# Patient Record
Sex: Female | Born: 1940 | Race: White | Hispanic: No | Smoking: Never smoker
Health system: Southern US, Community
[De-identification: ages and names within clinical notes are randomized; demographics above are authoritative.]

## PROBLEM LIST (undated history)

## (undated) DIAGNOSIS — R001 Bradycardia, unspecified: Secondary | ICD-10-CM

## (undated) DIAGNOSIS — R5383 Other fatigue: Secondary | ICD-10-CM

## (undated) DIAGNOSIS — R0602 Shortness of breath: Secondary | ICD-10-CM

## (undated) DIAGNOSIS — Z136 Encounter for screening for cardiovascular disorders: Secondary | ICD-10-CM

## (undated) DIAGNOSIS — I2510 Atherosclerotic heart disease of native coronary artery without angina pectoris: Secondary | ICD-10-CM

## (undated) DIAGNOSIS — E039 Hypothyroidism, unspecified: Secondary | ICD-10-CM

## (undated) DIAGNOSIS — M1990 Unspecified osteoarthritis, unspecified site: Secondary | ICD-10-CM

## (undated) DIAGNOSIS — J45909 Unspecified asthma, uncomplicated: Secondary | ICD-10-CM

## (undated) DIAGNOSIS — H269 Unspecified cataract: Secondary | ICD-10-CM

## (undated) DIAGNOSIS — F329 Major depressive disorder, single episode, unspecified: Secondary | ICD-10-CM

## (undated) DIAGNOSIS — K219 Gastro-esophageal reflux disease without esophagitis: Secondary | ICD-10-CM

## (undated) DIAGNOSIS — Z9289 Personal history of other medical treatment: Secondary | ICD-10-CM

## (undated) DIAGNOSIS — M8580 Other specified disorders of bone density and structure, unspecified site: Secondary | ICD-10-CM

## (undated) DIAGNOSIS — F32A Depression, unspecified: Secondary | ICD-10-CM

## (undated) DIAGNOSIS — Z8669 Personal history of other diseases of the nervous system and sense organs: Secondary | ICD-10-CM

## (undated) DIAGNOSIS — E785 Hyperlipidemia, unspecified: Secondary | ICD-10-CM

## (undated) DIAGNOSIS — M25569 Pain in unspecified knee: Secondary | ICD-10-CM

## (undated) DIAGNOSIS — E7800 Pure hypercholesterolemia, unspecified: Secondary | ICD-10-CM

## (undated) DIAGNOSIS — Z1211 Encounter for screening for malignant neoplasm of colon: Secondary | ICD-10-CM

## (undated) DIAGNOSIS — R269 Unspecified abnormalities of gait and mobility: Secondary | ICD-10-CM

## (undated) DIAGNOSIS — R059 Cough, unspecified: Secondary | ICD-10-CM

## (undated) DIAGNOSIS — R1310 Dysphagia, unspecified: Secondary | ICD-10-CM

## (undated) DIAGNOSIS — E559 Vitamin D deficiency, unspecified: Secondary | ICD-10-CM

## (undated) DIAGNOSIS — J4530 Mild persistent asthma, uncomplicated: Secondary | ICD-10-CM

## (undated) DIAGNOSIS — K8010 Calculus of gallbladder with chronic cholecystitis without obstruction: Secondary | ICD-10-CM

## (undated) DIAGNOSIS — R1011 Right upper quadrant pain: Secondary | ICD-10-CM

## (undated) DIAGNOSIS — M25469 Effusion, unspecified knee: Secondary | ICD-10-CM

## (undated) DIAGNOSIS — K2210 Ulcer of esophagus without bleeding: Secondary | ICD-10-CM

## (undated) HISTORY — DX: Hyperlipidemia, unspecified: E78.5

## (undated) HISTORY — DX: Depression, unspecified: F32.A

## (undated) HISTORY — DX: Personal history of other diseases of the nervous system and sense organs: Z86.69

## (undated) HISTORY — DX: Personal history of other medical treatment: Z92.89

## (undated) HISTORY — DX: Other specified disorders of bone density and structure, unspecified site: M85.80

## (undated) HISTORY — DX: Major depressive disorder, single episode, unspecified: F32.9

## (undated) HISTORY — DX: Unspecified cataract: H26.9

## (undated) HISTORY — DX: Gastro-esophageal reflux disease without esophagitis: K21.9

## (undated) HISTORY — DX: Unspecified osteoarthritis, unspecified site: M19.90

## (undated) HISTORY — DX: Atherosclerotic heart disease of native coronary artery without angina pectoris: I25.10

## (undated) HISTORY — DX: Hypothyroidism, unspecified: E03.9

## (undated) HISTORY — PX: OTHER SURGICAL HISTORY: SHX169

## (undated) HISTORY — DX: Bradycardia, unspecified: R00.1

---

## 2004-08-01 ENCOUNTER — Encounter: Payer: Self-pay | Admitting: Cardiology

## 2006-03-31 ENCOUNTER — Encounter: Payer: Self-pay | Admitting: Cardiology

## 2006-06-10 ENCOUNTER — Encounter: Payer: Self-pay | Admitting: Cardiology

## 2007-11-09 ENCOUNTER — Encounter: Payer: Self-pay | Admitting: Cardiology

## 2008-01-14 ENCOUNTER — Encounter: Payer: Self-pay | Admitting: Cardiology

## 2008-09-08 LAB — METABOLIC PANEL, COMPREHENSIVE
A-G Ratio: 1.3 (ref 1.1–2.2)
ALT (SGPT): 42 U/L (ref 12–78)
AST (SGOT): 21 U/L (ref 15–37)
Albumin: 4 g/dL (ref 3.5–5.0)
Alk. phosphatase: 108 U/L (ref 50–136)
Anion gap: 5 mmol/L (ref 5–15)
BUN/Creatinine ratio: 15 (ref 12–20)
BUN: 12 MG/DL (ref 6–20)
Bilirubin, total: 0.5 MG/DL (ref 0.2–1.0)
CO2: 32 MMOL/L (ref 21–32)
Calcium: 9.4 MG/DL (ref 8.5–10.1)
Chloride: 97 MMOL/L (ref 97–108)
Creatinine: 0.8 MG/DL (ref 0.6–1.3)
GFR est AA: >60 mL/min/{1.73_m2} (ref >60)
GFR est non-AA: >60 mL/min/{1.73_m2} (ref >60)
Globulin: 3.1 g/dL (ref 2.0–4.0)
Glucose: 94 MG/DL (ref 65–100)
Potassium: 3.9 MMOL/L (ref 3.5–5.1)
Protein, total: 7.1 g/dL (ref 6.4–8.2)
Sodium: 134 MMOL/L — ABNORMAL LOW (ref 136–145)

## 2008-09-08 LAB — URINALYSIS W/MICROSCOPIC
Bacteria: NEGATIVE /HPF
Bilirubin: NEGATIVE
Blood: NEGATIVE
Glucose: NEGATIVE MG/DL
Ketone: NEGATIVE MG/DL
Leukocyte Esterase: NEGATIVE
Nitrites: NEGATIVE
Protein: NEGATIVE MG/DL
Specific gravity: 1.015 (ref 1.003–1.030)
Urobilinogen: 0.2 EU/DL (ref 0.2–1.0)
pH (UA): 5 (ref 5.0–8.0)

## 2008-09-08 LAB — CBC W/O DIFF
HCT: 41 % (ref 35.0–47.0)
HGB: 14.1 g/dL (ref 11.5–16.0)
MCH: 30.1 PG (ref 26.0–34.0)
MCHC: 34.4 g/dL (ref 30.0–36.5)
MCV: 87.6 FL (ref 80.0–99.0)
PLATELET: 214 10*3/uL (ref 150–400)
RBC: 4.68 M/uL (ref 3.80–5.20)
RDW: 12.8 % (ref 11.5–14.5)
WBC: 7.2 10*3/uL (ref 3.6–11.0)

## 2008-09-08 LAB — PT AND PTT
INR: 1.1 (ref 0.9–1.1)
Prothrombin time: 10.7 SECS (ref 9.0–11.0)
aPTT: 25.8 s (ref 24.0–33.0)

## 2008-09-08 LAB — CEA: CEA: 0.5 ng/mL

## 2008-09-08 NOTE — Other (Signed)
Chart taken to endoscopy.

## 2008-09-13 LAB — EKG, 12 LEAD, INITIAL
Atrial Rate: 50 {beats}/min
Calculated P Axis: 69 degrees
Calculated R Axis: -6 degrees
Calculated T Axis: 51 degrees
P-R Interval: 158 ms
Q-T Interval: 442 ms
QRS Duration: 84 ms
QTC Calculation (Bezet): 402 ms
Ventricular Rate: 50 {beats}/min

## 2008-09-15 MED ADMIN — meperidine (DEMEROL) injection 50 mg: INTRAVENOUS | @ 12:00:00 | NDC 00409117830

## 2008-09-15 MED ADMIN — bupivacaine 0.25% -epinephrine 1:200,000 (SENSORCAINE) 0.25 %-1:200,000 infusion 35 mg: SUBCUTANEOUS | @ 18:00:00 | NDC 00409904202

## 2008-09-15 MED ADMIN — midazolam (VERSED) injection 2.5 mg: INTRAVENOUS | @ 12:00:00 | NDC 10019002837

## 2008-09-15 MED ADMIN — dextrose 5% - 0.45% NaCl with KCl 20 mEq/L 20 mEq/L infusion: @ 20:00:00 | NDC 00409790209

## 2008-09-15 MED ADMIN — meperidine (DEMEROL) 10 mg/mL: @ 20:00:00 | NDC 00409603004

## 2008-09-15 MED ADMIN — alvimopan (ENTEREG) capsule 12 mg: ORAL | @ 17:00:00 | NDC 11227001031

## 2008-09-15 MED ADMIN — cefoTETAN (CEFOTAN) 2 g in 0.9% sodium chloride (MBP/ADV) 50 mL add-vantage: INTRAVENOUS | @ 18:00:00 | NDC 63323038620

## 2008-09-15 MED ADMIN — dextrose 5 % - 0.9% NaCl infusion: INTRAVENOUS | @ 11:00:00 | NDC 00409794109

## 2008-09-15 MED ADMIN — 0.9% sodium chloride infusion 500 mL: INTRAVENOUS | @ 12:00:00 | NDC 87701099893

## 2008-09-15 MED ADMIN — dextrose 5% - 0.45% NaCl with KCl 20 mEq/L infusion: INTRAVENOUS | NDC 00409790209

## 2008-09-15 MED FILL — D5-1/2 NS & POTASSIUM CHLORIDE 20 MEQ/L IV: 20 mEq/L | INTRAVENOUS | Qty: 1000

## 2008-09-15 MED FILL — MEPERIDINE 10 MG/ML IJ CRTG: 10 mg/mL | INTRAMUSCULAR | Qty: 30

## 2008-09-15 MED FILL — LACTATED RINGERS IV: INTRAVENOUS | Qty: 1000

## 2008-09-15 MED FILL — CEFOTETAN 2 GRAM SOLUTION FOR INJECTION: 2 gram | INTRAMUSCULAR | Qty: 2

## 2008-09-15 MED FILL — DEXTROSE 5% IN NORMAL SALINE IV: INTRAVENOUS | Qty: 1000

## 2008-09-15 MED FILL — PROTONIX 40 MG TABLET,DELAYED RELEASE: 40 mg | ORAL | Qty: 1

## 2008-09-15 MED FILL — SODIUM CHLORIDE 0.9 % IV: INTRAVENOUS | Qty: 500

## 2008-09-15 NOTE — Progress Notes (Signed)
Flex sig completed with attempted removal of rectal polyp. Unable to remove polyp.Site injected with 5cc of [place] ink.

## 2008-09-15 NOTE — Other (Signed)
TRANSFER - IN REPORT:    Verbal report received from Ed Lesniak,CRNA on Leslie Potter  being received from OR for routine post - op      Report consisted of patient???s Situation, Background, Assessment and   Recommendations(SBAR).     Information from the following report(s) OR Summary, Procedure Summary, Intake/Output and MAR was reviewed with the receiving nurse.    Opportunity for questions and clarification was provided.      Assessment completed upon patient???s arrival to unit and care assumed.

## 2008-09-15 NOTE — Op Note (Signed)
Op Notes signed by Delfina Redwood, MD at 09/24/08 1839                 Author: Delfina Redwood, MD  Service: --  Author Type: Physician       Filed: 09/24/08 1839  Date of Service: 09/15/08 2135  Status: Signed          Editor: Delfina Redwood, MD (Physician)          <!--EPICS--> Name:      Leslie Potter, Leslie Potter<BR> MR #:      [id number]                    Surgeon:        Tawni Pummel. Grettel Rames, <BR> M.D.<BR> Account #:  [id number]                 Surgery Date:   09/15/2008<BR> DOB:       1940-08-24<BR> Age:       68                           Location:<BR> <BR>                              OPERATIVE REPORT<BR> <BR>  PREOPERATIVE DIAGNOSIS:   Flat adenoma at 15 cm.<BR>  <BR> POSTOPERATIVE DIAGNOSIS:   Flat adenoma at 8 to 10 cm on superior aspect of<BR> the middle haustral fold (8 to 10 cm from anal verge with rectum<BR> decompressed).<BR> <BR> OPERATIVE PROCEDURE:  Flexible endoscopy with further tattooing at site;<BR>  lesion unable to be safely removed  endoscopically.<BR> <BR> SURGEON:  Tawni Pummel. Dierra Riesgo, MD<BR> <BR> INDICATIONS:  The patient is a very nice 68 year old white female who was<BR> found in her home in [place] to have a flat adenoma at what was felt  to be<BR> 15 cm.  It could not be removed in its entirety, but it was piecemeal<BR> removed. The lesion is a flat adenoma, but because it is not able to be<BR> removed in its entirety endoscopically she was referred for resection.<BR> Prior to resecting  her I wanted to see if the lesion was actually at the<BR> level as stated and if it was removable endoscopically. Subsequently she<BR> was in for flexible sigmoidoscopy today prior to being taken to the<BR> operating room for what is planned to preliminarily  a low anterior<BR> resection for a lesion at 15 cm.<BR> <BR> PROCEDURE:  After IV sedation the patient was placed in left lateral<BR> position. The pediatric 160-CF endoscope introduced through the anal outlet<BR> and advanced  up to the rectum. When we  reached the middle rectal valve, the<BR> previous tattoo placed by the gastroenterologist was obvious. The scope was<BR> passed up over the middle rectal valve on the right and there the lesion<BR> was identified unequivocally. Again, the tattoo is around  this same general<BR> area and again it is unequivocal that this is the lesion. The scope was<BR> passed up to about 30 cm just to be sure there were no other abnormalities.<BR> AS the scope was pulled back down the same villous appearing lesion was<BR>  seen at the middle rectal valve and with the rectum decompressed this<BR> actually is 8 to 10 cm from the verge, not 15. In order to get below this<BR> we would be anastomosing into the lower third of the rectum rather than the<BR> middle third and this  would significantly increase potential  morbidities of<BR> the operation and potential extent of resection and complication rate.<BR> Subsequently, after trying to see if we could angle the scope around to get<BR> this lesion out endoscopically, it was  clear that that could not be safely<BR> done even with a pillow injection technique. It became clear that it would<BR> require some other resectional form, but we were hopeful that as the lesion<BR> was a 8 cm that perhaps with some prolapse of the down  we could get this<BR> out in a transanal fashion and avoid a major resection for this small<BR> benign-appearing lesion. Subsequently the area was further tattooed in the<BR> event that we are wrong and cannot get it out transanally. The air was<BR> aspirated  and the scope was then removed. A digital exam was done and I can<BR> get the tip of my finger up to and just over the top of this lesion as I<BR> prolapse it down into the canal. I think that there is probably about a 70%<BR> chance that we can get this  out in a transanal fashion and avoid a<BR> transabdominal  operation altogether for her. If not, then we will simply<BR> have to  proceed with the laparoscopic low anterior resection as originally<BR> planned. I have informed the patient and her family  of this. She was taken<BR> from endoscopy to the recovery area. She tolerated the exam nicely. She<BR> remained stable and left with a benign abdomen. Will move forward this<BR> afternoon with an initial attempt at transanal excision and if unsuccessful<BR>  move to a laparoscopic approach.<BR> <BR> <BR> <BR> <BR> Tawni Pummel. Ileene Allie, M.D.<BR> <BR> cc:   Tawni Pummel. Tilley Faeth, M.D.<BR> <BR> DR. Meriam Sprague GOODE<BR> <BR> WRT/wmx; D: 09/15/2008  3:45 P; T: 09/15/2008  9:35 P; Doc# [id number]; Job#<BR> [id number]<OZ>  <!--EPICE-->

## 2008-09-15 NOTE — Op Note (Signed)
Op Notes signed by Delfina Redwood, MD at 09/24/08 1610                 Author: Delfina Redwood, MD  Service: --  Author Type: Physician       Filed: 09/24/08 1838  Date of Service: 09/15/08 0907  Status: Signed          Editor: Delfina Redwood, MD (Physician)          <!--EPICS--> Name:      Leslie Potter, Leslie Potter<BR> MR #:      [id number]                    Surgeon:        Tawni Pummel. Jalesia Loudenslager, <BR> M.D.<BR> Account #:  [id number]                 Surgery Date:   09/15/2008<BR> DOB:       Jul 21, 1940<BR> Age:       68                           Location:       SURGOR  PL<BR> <BR>                              OPERATIVE REPORT<BR> <BR> <BR> PREOPERATIVE DIAGNOSIS:   1 cm  adenomatous polyp at 15 cm.<BR> <BR> POSTOPERATIVE DIAGNOSIS:   3 cm polyp at 8-10 cm in the decompressed state;<BR> too large for endoscopic safe removal.<BR> <BR> OPERATIVE PROCEDURE:  Flexible sigmoidoscopy with tattooing.<BR> <BR> SURGEON:  Tawni Pummel. Trinidad Petron, MD<BR> <BR> ANESTHESIA:  4 mm Versed; 50 mg Demerol IV.<BR> <BR> INDICATIONS:  Patient is a very pleasant 68 year old white female who was<BR> colonoscoped in [place] and reported to have a 1 cm lesion.  It was a flat<BR> adenoma not possible  to remove endoscopically.  Parts of it were removed<BR> and came back as adenomatous tissue that was tubular.  Subsequently she was<BR> initially set up for a low anterior resection in [place] and came to<BR> [place] seeking a second opinion.  I thought  that it was worthwhile to<BR> take a look at the polyp directly to make sure that the distance from the<BR> anal canal to the site of the polyp was appropriate as this directly<BR> affects the surgical technique and to see if the polyp was in fact only  1<BR> cm in size, and therefore, theoretically removable with various total<BR> injection techniques.  She is in now for the flexible sigmoidoscopy as the<BR> first step.  If we can get this lesion out completely and it  appears<BR> benign, we can avoid  a trip to the operating room.  She is aware of the<BR> risks and agrees to proceed.<BR> <BR> PROCEDURE:  After IV sedation, patient was placed in the left lateral<BR> position and the pediatric 160 stiffening colonoscope was introduced in the<BR> anal  outlet.  As we advanced the scope up, we went to the second haustral<BR> fold and immediately superior to the second haustral fold (the right one),<BR> is the polyp which is in fact about a 3 x 3 cm affair.  It appears soft and<BR> does not have evidence  of frank malignancy; however, its confirmation on<BR> the hallow of the haustra superiorly made injection and removal impossible.<BR> Tattoo was clearly present from her previous exam and again there was no<BR> question that this was the lesion that  was  identified in [place].  We did<BR> attempt to see whether or not the lesion could be safely snared, but that<BR> was simply not a reasonable possibility.  However, when measuring this out<BR> in the decompressed state, reading on the right haustral fold  obviously it<BR> was much lower than 15 cm and in fact, it appeared that it prolapsed down<BR> nicely such that it may even be approachable transanally.  Subsequently the<BR> area was circumferentially retattooed in the event that transanal excision<BR>  is not possible and that we do have to go what in fact be a low anterior<BR> resection down with the anastomosis to the lower third of the rectum in<BR> order to get safely below this lesion.  Once the air was aspirated and the<BR> scope was removed,  I did a digital exam and indeed I can feel this lesion<BR> with the tip of my examining finger.  It is a right-sided lesion, moving<BR> around to the right anterior side.  It does prolapse down and I think that<BR> there is a chance that this could be  removed transanally.  The lesion is<BR> completely soft and there were no malignant features on digital palpation.<BR> Subsequently we  will take her to the operating room this afternoon and we<BR> will start the procedure as a transanal procedure and  see whether or not we<BR> have adequate exposure for a good and complete safe total excision in a<BR> transanal fashion.  Again there are no frank malignant characteristics of<BR> the lesion and I can feel the entire lesion which feels soft and there  are<BR> no ulcerated or stony hard components within it.  I suspect this is at this<BR> current time a completely benign lesion, but unless we can get a complete<BR> excision of the lesion at transanal approach, then we would need to convert<BR> to a  standard laparoscopic low-anterior venue and we will be prepared for<BR> either eventuality this afternoon.  Subsequently she left endoscopy with a<BR> benign abdomen.<BR> <BR> <BR> <BR> Reviewed on 09/15/2008 3:01 PM<BR> <BR> <BR> <BR> <BR> Tawni Pummel.  Jaquon Gingerich, M.D.<BR> <BR> cc:   Tawni Pummel. Cloys Vera, M.D.<BR> <BR> DR. Meriam Sprague GOODE, [place]<BR> <BR> WRT/wmx; D: 09/15/2008  8:19 A; T: 09/15/2008  9:07 A; Doc# [id number]; Job#<BR> [id number]<BJ> <!--EPICE-->

## 2008-09-15 NOTE — Procedures (Signed)
Flexible sigmoidoscopy with tattoo ; lesion not removeable endoscopically; will go to OR for surgical removal.

## 2008-09-15 NOTE — Procedures (Signed)
Flex sig with tattoo;  Lesion at 8 to 10 cm on superior aspect of middle haustral valve; not endoscopically removeable;  Will try transanal approach first in OR this afternoon

## 2008-09-15 NOTE — Op Note (Signed)
Op Notes signed by Leslie Redwood, MD at 09/24/08 Leslie Potter                 Author: Delfina Redwood, MD  Service: --  Author Type: Physician       Filed: 09/24/08 1838  Date of Service: 09/15/08 2101  Status: Signed          Editor: Leslie Redwood, MD (Physician)          <!--EPICS--> Name:      Leslie Potter, Leslie Potter<BR> MR #:      [id number]                    Surgeon:        Leslie Potter, <BR> M.D.<BR> Account #:  [id number]                 Surgery Date:<BR> DOB:       02/12/1941<BR> Age:       68                           Location:       [id number]<BR> <BR>                              OPERATIVE REPORT<BR> <BR> <BR> PREOPERATIVE DIAGNOSIS:  Flat adenoma of the  rectum at 8 to 10 cm.<BR> <BR> POSTOPERATIVE DIAGNOSIS:  Flat adenoma of the rectum at 8 to 10 cm, with<BR> lesion on right anterior wall.<BR> <BR> OPERATIVE PROCEDURE:  Transanal excision of flat adenoma at 8 to 10 cm on<BR> right anterior wall.<BR>  <BR> SURGEON:  Leslie Pummel. Colonel Krauser, MD<BR> <BR> ANESTHESIA:  General.<BR> <BR> PREOP MEDICATIONS:  Cefotan 2 g IV, and Entereg 12 mg p.o.<BR> <BR> INDICATIONS FOR PROCEDURE:  The patient is a very nice 68 year old white<BR> female who presents with a  flat adenoma of the rectum.  This was initially<BR> reported at 15 cm but on endoscopy this morning it actually measures at 8<BR> to 10, and is just on the superior aspect of the right haustral valve of<BR> the rectum.  This appears to be a small lesion,  but because of its<BR> confirmation on the superior aspect of the valve, a good angle to get the<BR> lesion out endoscopically is not possible, and therefore we did a digital<BR> exam while she was sedated, and I could feel this lesion and actually get<BR>  superior to it.  I think that there is a chance that she is at the very<BR> upper reaches of a transanal excision, and therefore it would be a shame if<BR> we ended up having to do a major abdominal operation for a lesion that is<BR>  benign and that we  may be able to get out through the anal canal.<BR> Subsequently, she is in now for transanal excision, but she and her family<BR> are both aware that if the lesion cannot be adequately exposed and resected<BR> transanally, then we will proceed with a  laparoscopic low anterior<BR> resection.  She is comfortable with all this, and is aware of the risks and<BR> benefits, and agrees to proceed.<BR> <BR> PROCEDURE:  After uneventful induction of general anesthetic, the patient<BR> was placed in a prone  jackknife position, and sterilely prepped and draped.<BR> Buttocks were taped apart, and a [place] retractor placed.  The perianal<BR> verge infiltrated with 0.25% Marcaine with 1:100,000 epinephrine, and the<BR> large Ferguson anal retractor placed.   At  the superior reaches, we could<BR> see the polyp on the right anterior side, and with neural flat retractors<BR> (spatula type) and the [facility], we were able to expose the lesion, and<BR> grasp it with a DeBakey, and get a traction stitch in inferiorly  to it.  We<BR> could then use this 2-0 traction stitch of Vicryl for leverage to pull the<BR> lesion, and it was able to be prolapsed out into view.  It could then be<BR> infiltrated at its base with 0.5% Marcaine with 1:200,000 epinephrine, and<BR> then  excised from the rectum, with a margin of what appears to be normal<BR> tissue.  The lesion is soft, and appeared to be benign in excisional<BR> fashion as it did at endoscopy this morning.  The lesion was pinned out on<BR> the back table and sent directly  for permanent margins and markings and<BR> pathology.  The anterior wall was now checked, and found to be hemostatic.<BR> The defect was reapproximated with 2-0 Vicryl suture in interrupted<BR> fashion.  This completed the procedure.  The retractors were  removed, and<BR> the procedure was terminated.  Blood loss was LES than 25 cc.  She remained<BR> stable and left with a correct sponge and  needle count.  Dressings were<BR> applied.  She will be watched overnight to be sure that we have not<BR> resulted  in a full thickness perforation, since this is at or just above<BR> the level of the peritoneal reflection, by virtue of its location just<BR> above the right haustral valve.  However, for the time being she did quite<BR> nicely, and hopefully will be  able to settle this issue without having to<BR> resort to a transabdominal operation.<BR> <BR> <BR> <BR> Reviewed on 09/16/2008 11:04 AM<BR> <BR> <BR> <BR> <BR> Leslie Potter, M.D.<BR> <BR> cc:   Leslie Potter, M.D.<BR> <BR> Leslie Potter<BR>  <BR> WRT/wmx; D: 09/15/2008  3:15 P; T: 09/15/2008  9:01 P; Doc# [id number]; Job#<BR> [id number]<BJ> <!--EPICE-->

## 2008-09-15 NOTE — Procedures (Signed)
Flexible sigmoidoscopy with tattoo ; lesion not removeable endoscopically; will go to OR for surgical removal.

## 2008-09-15 NOTE — Op Note (Signed)
Name: Leslie Potter, MAUCH  MR #: [id number] Surgeon: Tawni Pummel. Julious Payer,   M.D.  Account #: [id number] Surgery Date:  DOB: 1940/08/13  Age: 68 Location:     OPERATIVE REPORT    PREOPERATIVE DIAGNOSIS: Flat adenoma of the rectum at 8 to 10 cm.    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    OPERATIVE PROCEDURE: Transanal excision of flat adenoma at 8 to 10 cm on  right anterior wall.    SURGEON: Tawni Pummel. Daelan Gatt, MD    ANESTHESIA: General.    PREOP MEDICATIONS: Cefotan 2 g IV, and Entereg 12 mg p.o.    INDICATIONS FOR PROCEDURE: The patient is a very nice 68 year old white  female who presents with a flat adenoma of the rectum. This was initially  reported at 15 cm but on endoscopy this morning it actually measures at 8  to 10, and is just on the superior aspect of the right haustral valve of  the rectum. This appears to be a small lesion, but because of its  confirmation on the superior aspect of the valve, a good angle to get the  lesion out endoscopically is not possible, and therefore we did a digital  exam while she was sedated, and I could feel this lesion and actually get  superior to it. I think that there is a chance that she is at the very  upper reaches of a transanal excision, and therefore it would be a shame if  we ended up having to do a major abdominal operation for a lesion that is  benign and that we may be able to get out through the anal canal.  Subsequently, she is in now for transanal excision, but she and her family  are both aware that if the lesion cannot be adequately exposed and resected  transanally, then we will proceed with a laparoscopic low anterior  resection. She is comfortable with all this, and is aware of the risks and  benefits, and agrees to proceed.    PROCEDURE: After uneventful induction of general anesthetic, the patient  was placed in a prone jackknife position, and sterilely prepped and draped.   Buttocks were taped apart, and a [place] retractor placed. The perianal  verge infiltrated with 0.25% Marcaine with 1:100,000 epinephrine, and the  large Ferguson anal retractor placed. At the superior reaches, we could  see the polyp on the right anterior side, and with neural flat retractors  (spatula type) and the [facility], we were able to expose the lesion, and  grasp it with a DeBakey, and get a traction stitch in inferiorly to it. We  could then use this 2-0 traction stitch of Vicryl for leverage to pull the  lesion, and it was able to be prolapsed out into view. It could then be  infiltrated at its base with 0.5% Marcaine with 1:200,000 epinephrine, and  then excised from the rectum, with a margin of what appears to be normal  tissue. The lesion is soft, and appeared to be benign in excisional  fashion as it did at endoscopy this morning. The lesion was pinned out on  the back table and sent directly for permanent margins and markings and  pathology. The anterior wall was now checked, and found to be hemostatic.  The defect was reapproximated with 2-0 Vicryl suture in interrupted  fashion. This completed the procedure. The retractors were removed, and  the procedure was  terminated. Blood loss was LES than 25 cc. She remained  stable and left with a correct sponge and needle count. Dressings were  applied. She will be watched overnight to be sure that we have not  resulted in a full thickness perforation, since this is at or just above  the level of the peritoneal reflection, by virtue of its location just  above the right haustral valve. However, for the time being she did quite  nicely, and hopefully will be able to settle this issue without having to  resort to a transabdominal operation.          Tawni Pummel. Julious Payer, M.D.    cc: Tawni Pummel. Julious Payer, M.D.    Meriam Sprague GOODE    WRT/wmx; D: 09/15/2008 3:15 P; T: 09/15/2008 9:01 P; Doc# [id number]; Job#  [id number]

## 2008-09-15 NOTE — Progress Notes (Signed)
Instructed on how to use IS; can get up to 1250. Also placed SCDs  On BLE

## 2008-09-15 NOTE — Op Note (Signed)
Name: Leslie Potter, Leslie Potter  MR #: [id number] Surgeon: Tawni Pummel. Julious Payer,   M.D.  Account #: [id number] Surgery Date: 09/15/2008  DOB: 1940-08-15  Age: 68 Location: SURGOR PL     OPERATIVE REPORT      PREOPERATIVE DIAGNOSIS: 1 cm adenomatous polyp at 15 cm.    POSTOPERATIVE DIAGNOSIS: 3 cm polyp at 8-10 cm in the decompressed state;  too large for endoscopic safe removal.    OPERATIVE PROCEDURE: Flexible sigmoidoscopy with tattooing.    SURGEON: Tawni Pummel. Emmilyn Crooke, MD    ANESTHESIA: 4 mm Versed; 50 mg Demerol IV.    INDICATIONS: Patient is a very pleasant 68 year old white female who was  colonoscoped in [place] and reported to have a 1 cm lesion. It was a flat  adenoma not possible to remove endoscopically. Parts of it were removed  and came back as adenomatous tissue that was tubular. Subsequently she was  initially set up for a low anterior resection in [place] and came to  [place] seeking a second opinion. I thought that it was worthwhile to  take a look at the polyp directly to make sure that the distance from the  anal canal to the site of the polyp was appropriate as this directly  affects the surgical technique and to see if the polyp was in fact only 1  cm in size, and therefore, theoretically removable with various total  injection techniques. She is in now for the flexible sigmoidoscopy as the  first step. If we can get this lesion out completely and it appears  benign, we can avoid a trip to the operating room. She is aware of the  risks and agrees to proceed.    PROCEDURE: After IV sedation, patient was placed in the left lateral  position and the pediatric 160 stiffening colonoscope was introduced in the  anal outlet. As we advanced the scope up, we went to the second haustral  fold and immediately superior to the second haustral fold (the right one),  is the polyp which is in fact about a 3 x 3 cm affair. It appears soft and   does not have evidence of frank malignancy; however, its confirmation on  the hallow of the haustra superiorly made injection and removal impossible.  Tattoo was clearly present from her previous exam and again there was no  question that this was the lesion that was identified in [place]. We did  attempt to see whether or not the lesion could be safely snared, but that  was simply not a reasonable possibility. However, when measuring this out  in the decompressed state, reading on the right haustral fold obviously it  was much lower than 15 cm and in fact, it appeared that it prolapsed down  nicely such that it may even be approachable transanally. Subsequently the  area was circumferentially retattooed in the event that transanal excision  is not possible and that we do have to go what in fact be a low anterior  resection down with the anastomosis to the lower third of the rectum in  order to get safely below this lesion. Once the air was aspirated and the  scope was removed, I did a digital exam and indeed I can feel this lesion  with the tip of my examining finger. It is a right-sided lesion, moving  around to the right anterior side. It does prolapse down and I think that  there is a chance that this could be removed transanally. The lesion is  completely soft and there were no malignant features on digital palpation.  Subsequently we will take her to the operating room this afternoon and we  will start the procedure as a transanal procedure and see whether or not we  have adequate exposure for a good and complete safe total excision in a  transanal fashion. Again there are no frank malignant characteristics of  the lesion and I can feel the entire lesion which feels soft and there are  no ulcerated or stony hard components within it. I suspect this is at this  current time a completely benign lesion, but unless we can get a complete  excision of the lesion at transanal approach, then we would need to convert   to a standard laparoscopic low-anterior venue and we will be prepared for  either eventuality this afternoon. Subsequently she left endoscopy with a  benign abdomen.        Reviewed on 09/15/2008 3:01 PM          Tawni Pummel. Julious Payer, M.D.    cc: Tawni Pummel. Julious Payer, M.D.    DR. Loraine Maple, [place]    WRT/wmx; D: 09/15/2008 8:19 A; T: 09/15/2008 9:07 A; Doc# [id number]; Job#  [id number]

## 2008-09-15 NOTE — Progress Notes (Signed)
VSS; pt assisted to BR to void and try to pass flatus.  Will call report to OR and transport pt to OR holding

## 2008-09-15 NOTE — Procedures (Signed)
Flex sig with tattoo;  Lesion at 8 to 10 cm on superior aspect of middle haustral valve; not endoscopically removeable;  Will try transanal approach first in OR this afternoon

## 2008-09-15 NOTE — Op Note (Signed)
Name: Leslie Potter, Leslie Potter  MR #: [id number] Surgeon: Tawni Pummel. Julious Payer,   M.D.  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    OPERATIVE PROCEDURE: Transanal excision of flat adenoma at 8 to 10 cm on  right anterior wall.    SURGEON: Tawni Pummel. Deaysia Grigoryan, MD    ANESTHESIA: General.    PREOP MEDICATIONS: Cefotan 2 g IV, and Entereg 12 mg p.o.    INDICATIONS FOR PROCEDURE: The patient is a very nice 68 year old white  female who presents with a flat adenoma of the rectum. This was initially  reported at 15 cm but on endoscopy this morning it actually measures at 8  to 10, and is just on the superior aspect of the right haustral valve of  the rectum. This appears to be a small lesion, but because of its  confirmation on the superior aspect of the valve, a good angle to get the  lesion out endoscopically is not possible, and therefore we did a digital  exam while she was sedated, and I could feel this lesion and actually get  superior to it. I think that there is a chance that she is at the very  upper reaches of a transanal excision, and therefore it would be a shame if  we ended up having to do a major abdominal operation for a lesion that is  benign and that we may be able to get out through the anal canal.  Subsequently, she is in now for transanal excision, but she and her family  are both aware that if the lesion cannot be adequately exposed and resected  transanally, then we will proceed with a laparoscopic low anterior  resection. She is comfortable with all this, and is aware of the risks and  benefits, and agrees to proceed.    PROCEDURE: After uneventful induction of general anesthetic, the patient  was placed in a prone jackknife position, and sterilely prepped and draped.   Buttocks were taped apart, and a Lone Star retractor placed. The perianal  verge infiltrated with 0.25% Marcaine with 1:100,000 epinephrine, and the  large Ferguson anal retractor placed. At the superior reaches, we could  see the polyp on the right anterior side, and with neural flat retractors  (spatula type) and the [facility], we were able to expose the lesion, and  grasp it with a DeBakey, and get a traction stitch in inferiorly to it. We  could then use this 2-0 traction stitch of Vicryl for leverage to pull the  lesion, and it was able to be prolapsed out into view. It could then be  infiltrated at its base with 0.5% Marcaine with 1:200,000 epinephrine, and  then excised from the rectum, with a margin of what appears to be normal  tissue. The lesion is soft, and appeared to be benign in excisional  fashion as it did at endoscopy this morning. The lesion was pinned out on  the back table and sent directly for permanent margins and markings and  pathology. The anterior wall was now checked, and found to be hemostatic.  The defect was reapproximated with 2-0 Vicryl suture in interrupted  fashion. This completed the procedure. The retractors were removed, and  the procedure was terminated. Blood loss was LES than 25 cc. She remained  stable and left with a correct sponge and needle count. Dressings were  applied. She will be watched overnight to be sure that we have not  resulted in a full thickness perforation, since this is at or just above  the level of the peritoneal reflection, by virtue of its location just  above the right haustral valve. However, for the time being she did quite  nicely, and hopefully will be able to settle this issue without having to  resort to a transabdominal operation.        Reviewed on 09/16/2008 11:04 AM          Tawni Pummel. Julious Payer, M.D.    cc: Tawni Pummel. Julious Payer, M.D.    Meriam Sprague GOODE    WRT/wmx; D: 09/15/2008 3:15 P; T: 09/15/2008 9:01 P; Doc# [id number]; Job#  [id number]

## 2008-09-15 NOTE — H&P (Signed)
Name: Leslie Potter, Leslie Potter Admitted: 09/15/2008  MR #: [id number] DOB: 24-Apr-1941  Account #: [id number] Age: 68  Physician: Tawni Pummel. Julious Payer, M.D. Location:     HISTORY PHYSICAL      REASON FOR ADMISSION: Flat adenoma at 15 cm.    HISTORY OF PRESENT ILLNESS: The patient is a very pleasant 68 year old  white female from the [place] area. Her physician is Loraine Maple. The  patient recently underwent a colonoscopy, which was reported as a flat 1 cm  polyp at about 15 cm. It was removed partially in piecemeal fashion. It  was not felt by the endoscopist to be removed completely. The area was  tattooed with [place] Ink and the pathology has come back showing a tubular  adenoma. She apparently was recommended to undergo a laparoscopic sigmoid  colectomy to remove the remainder of it, but she is not sure whether that  is the best option or way for her to go. She subsequently came in to see  [place] to discuss other options. On reviewing the pathology, the largest  specimen segment was 0.2 cm to 1.1 cm in greatest size. I am not sure how  much of the polyp may be left in or if the polyp confirmation prevented  complete extrication of what appears to be only a 1 cm approximate sized  polyp. At any rate, she came into the office for various treatment option  considerations. She is otherwise asymptomatic from the standpoint of  passing blood or symptoms which would be directly referable to the polyp.  She has however, had some intermittent abdominal discomfort.    PAST MEDICAL HISTORY:  1. Significant for a T and A.  2. She has had bilateral tubal ligations.  3. She has had 2 retinal tears fixed with laser.  4. She has had a vaginal delivery.  5. She is hypothyroid.  6. She has hypercholesterolemia.    MEDICATIONS: Have included:  1. T3 and Synthroid.  2. As well as Lexapro.  3. Zocor.    ALLERGIES: None.    SOCIAL HISTORY: She is divorced. She rarely drinks, does not smoke. She  works as a Engineer, civil (consulting).     FAMILY HISTORY: Significant for mitral stenosis, heart disease and  hypertension, as well as an abdominal aortic aneurysm.    REVIEW OF SYSTEMS: Significant for abdominal discomfort as noted above.  She has occasional headaches. She has had about a 5 lb weight loss. She  has had some joint pains in her knees and some urinary frequency. She has  had temperature intolerance and fatigue likely secondary to her  hypothyroidism.    PHYSICAL EXAMINATION:  GENERAL: Physical exam in the office revealed a pleasant 68 year old,  white female in no acute distress.  HEENT: ENT is unremarkable. Sclera clear. Pupils reactive.  NECK: Supple.  LUNGS: Clear.  CARDIAC: Regular without murmur or failure.  ABDOMEN: Soft and nontender. There are no masses or organomegaly. Bowel  sounds are active. Flanks are negative. Groin is negative.  RECTAL: Negative.  EXTREMITIES; No gross deformity.  NEUROLOGIC: Exam is intact.  SKIN: Clear.    ASSESSMENT: A 68 year old white female with a reported 1 cm flat adenoma  at 15 cm. It has been tattooed, but could not be removed in its entirety  at the colonoscopy. Pathology has shown it to be a tubal adenoma without  any evidence of dysplasia or malignancy.    PLAN: We think it is reasonable to try to remove the entire polyp  endoscopically if  possible. If that is not possible, then certainly  removal given the risk factors of flat adenomas would be best accomplished  in that event by a resection of her sigmoid and that again, would make a  laparoscopic option the most attractive. However, before she goes to  surgery, I think it is reasonable to get 1 more attempted removal of the  remainder of the polyp. If it could be removed in its entirety, and if the  pathology is benign, then this could spare her a resection. If on the  other hand, it is not amenable to being removed endoscopically, then she  should move forward and remove the specimen surgically. Subsequently, we   have elected to do this on the same day. She is going to be brought to  colonoscopy in the morning for repeat attempt and investigation of the  possibilities of removing the lesion in its entirety endoscopically. If  that is successful, we will cancel any surgical plans pending the pathology  report. If on the other hand, the lesion does not appear to be an  appropriate candidate for endoscopic removal, then she will proceed in the  afternoon of admission with a laparoscopic, possible open sigmoid  colectomy. She is aware of the risks of colonoscopy including bleeding,  perforation and the risk of missing small polyps, and she is awake of risks  of the surgery including the risks of anesthesia, infection, bleeding,  transfusions, risks of transfusions, alternatives to the same, DVT,  atelectasis, and pneumonia, pulmonary embolism and cardiac and potential  CNS problems. She is aware of all this and wishes to proceed with the plan  as outlined above.        Reviewed on 09/15/2008 4:27 AM              Tawni Pummel. Julious Payer, M.D.    cc: Tawni Pummel. Julious Payer, M.D.    DR. Meriam Sprague GOODE    WRT/wmx; D: 09/14/2008 11:40 P; T: 09/15/2008 12:33 A; DOC# [id number]; Job#  [id number]

## 2008-09-15 NOTE — Other (Signed)
TRANSFER - OUT REPORT:    Verbal report given to Tracy,RN(name) on Grainne Engelhard  being transferred to 2109 for routine post - op       Report consisted of patient???s Situation, Background, Assessment and   Recommendations(SBAR).     Information from the following report(s) OR Summary, Procedure Summary, Intake/Output and MAR was reviewed with the receiving nurse.    Opportunity for questions and clarification was provided.

## 2008-09-15 NOTE — Progress Notes (Signed)
Received pt from PACU by stretcher; transferred independently to bed, call bell in reach, frequent VS started, rates pain 1/10, "ache" in anal area; demerol PCA in use; daughter in room. Gave pt 2 pepsis to drink; denies N/V. Anal packing intact, no errythema or drainage noted.

## 2008-09-15 NOTE — Brief Op Note (Signed)
BRIEF OPERATIVE NOTE  Preperative Diagnosis: FLAT ADENOMATOUS SIGMOID POLYPS  Post-operative Diagnosis: FLAT ADENOMATOUS SIGMOID POLYPS    Procedure:  COLON RESECTION SIGMOID LAPAROSCOPIC LEFT - trans anal resection    Surgeon: Tawni Pummel. Karim Aiello, MD  Assistant(s): none   Anesthesia: General   Estimated Blood Loss: 20cc's  Specimens:   ID Type Source Tests Collected by Time Destination   1 : right anterior rectal wall polyp Fresh   Desiree Hane 09/15/2008 1450 Pathology      Findings: See full operative note.  Complications: none  Implants: * No implants in log *

## 2008-09-15 NOTE — Progress Notes (Signed)
TRANSFER - OUT REPORT:    Verbal report given to erin on Leslie Potter  being transferred to 0r holding 22 for routine progression of care       Report consisted of patient???s Situation, Background, Assessment and   Recommendations(SBAR).     Information from the following report(s) Procedure Summary and MAR was reveiwed with the receiving nurse.    Opportunity for questions and clarification was provided.

## 2008-09-15 NOTE — Op Note (Deleted)
Name: Leslie Potter, Leslie Potter  MR #: [id number] Surgeon: Jorian Willhoite R. Emannuel Vise,   M.D.  Account #: [id number] Surgery Date:  DOB: 12/30/1940  Age: 67 Location: [id number]     OPERATIVE REPORT      PREOPERATIVE DIAGNOSIS: Flat adenoma of the rectum at 8 to 10 cm.    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    OPERATIVE PROCEDURE: Transanal excision of flat adenoma at 8 to 10 cm on  right anterior wall.    SURGEON: Corey Laski R. Rui Wordell, MD    ANESTHESIA: General.    PREOP MEDICATIONS: Cefotan 2 g IV, and Entereg 12 mg p.o.    INDICATIONS FOR PROCEDURE: The patient is a very nice 67-year-old white  female who presents with a flat adenoma of the rectum. This was initially  reported at 15 cm but on endoscopy this morning it actually measures at 8  to 10, and is just on the superior aspect of the right haustral valve of  the rectum. This appears to be a small lesion, but because of its  confirmation on the superior aspect of the valve, a good angle to get the  lesion out endoscopically is not possible, and therefore we did a digital  exam while she was sedated, and I could feel this lesion and actually get  superior to it. I think that there is a chance that she is at the very  upper reaches of a transanal excision, and therefore it would be a shame if  we ended up having to do a major abdominal operation for a lesion that is  benign and that we may be able to get out through the anal canal.  Subsequently, she is in now for transanal excision, but she and her family  are both aware that if the lesion cannot be adequately exposed and resected  transanally, then we will proceed with a laparoscopic low anterior  resection. She is comfortable with all this, and is aware of the risks and  benefits, and agrees to proceed.    PROCEDURE: After uneventful induction of general anesthetic, the patient  was placed in a prone jackknife position, and sterilely prepped and draped.   Buttocks were taped apart, and a Lone Star retractor placed. The perianal  verge infiltrated with 0.25% Marcaine with 1:100,000 epinephrine, and the  large Ferguson anal retractor placed. At the superior reaches, we could  see the polyp on the right anterior side, and with neural flat retractors  (spatula type) and the Ferguson, we were able to expose the lesion, and  grasp it with a DeBakey, and get a traction stitch in inferiorly to it. We  could then use this 2-0 traction stitch of Vicryl for leverage to pull the  lesion, and it was able to be prolapsed out into view. It could then be  infiltrated at its base with 0.5% Marcaine with 1:200,000 epinephrine, and  then excised from the rectum, with a margin of what appears to be normal  tissue. The lesion is soft, and appeared to be benign in excisional  fashion as it did at endoscopy this morning. The lesion was pinned out on  the back table and sent directly for permanent margins and markings and  pathology. The anterior wall was now checked, and found to be hemostatic.  The defect was reapproximated with 2-0 Vicryl suture in interrupted  fashion. This completed the procedure. The retractors were removed, and    the procedure was terminated. Blood loss was LES than 25 cc. She remained  stable and left with a correct sponge and needle count. Dressings were  applied. She will be watched overnight to be sure that we have not  resulted in a full thickness perforation, since this is at or just above  the level of the peritoneal reflection, by virtue of its location just  above the right haustral valve. However, for the time being she did quite  nicely, and hopefully will be able to settle this issue without having to  resort to a transabdominal operation.        Reviewed on 09/16/2008 11:04 AM          Jazlynn Nemetz R. Ricky Gallery, M.D.    cc: Lasya Vetter R. Caydin Yeatts, M.D.    [name]    WRT/wmx; D: 09/15/2008 3:15 P; T: 09/15/2008 9:01 P; Doc# [id number]; Job#  [id number]

## 2008-09-15 NOTE — Op Note (Signed)
Name: Leslie Potter, Leslie Potter  MR #: [id number] Surgeon: Tawni Pummel. Julious Payer,   M.D.  Account #: [id number] Surgery Date: 09/15/2008  DOB: 17-Oct-1940  Age: 68 Location:     OPERATIVE REPORT     PREOPERATIVE DIAGNOSIS: Flat adenoma at 15 cm.    POSTOPERATIVE DIAGNOSIS: Flat adenoma at 8 to 10 cm on superior aspect of  the middle haustral fold (8 to 10 cm from anal verge with rectum  decompressed).    OPERATIVE PROCEDURE: Flexible endoscopy with further tattooing at site;  lesion unable to be safely removed endoscopically.    SURGEON: Tawni Pummel. Indica Marcott, MD    INDICATIONS: The patient is a very nice 68 year old white female who was  found in her home in [place] to have a flat adenoma at what was felt to be  15 cm. It could not be removed in its entirety, but it was piecemeal  removed. The lesion is a flat adenoma, but because it is not able to be  removed in its entirety endoscopically she was referred for resection.  Prior to resecting her I wanted to see if the lesion was actually at the  level as stated and if it was removable endoscopically. Subsequently she  was in for flexible sigmoidoscopy today prior to being taken to the  operating room for what is planned to preliminarily a low anterior  resection for a lesion at 15 cm.    PROCEDURE: After IV sedation the patient was placed in left lateral  position. The pediatric 160-CF endoscope introduced through the anal outlet  and advanced up to the rectum. When we reached the middle rectal valve, the  previous tattoo placed by the gastroenterologist was obvious. The scope was  passed up over the middle rectal valve on the right and there the lesion  was identified unequivocally. Again, the tattoo is around this same general  area and again it is unequivocal that this is the lesion. The scope was  passed up to about 30 cm just to be sure there were no other abnormalities.  AS the scope was pulled back down the same villous appearing lesion was   seen at the middle rectal valve and with the rectum decompressed this  actually is 8 to 10 cm from the verge, not 15. In order to get below this  we would be anastomosing into the lower third of the rectum rather than the  middle third and this would significantly increase potential morbidities of  the operation and potential extent of resection and complication rate.  Subsequently, after trying to see if we could angle the scope around to get  this lesion out endoscopically, it was clear that that could not be safely  done even with a pillow injection technique. It became clear that it would  require some other resectional form, but we were hopeful that as the lesion  was a 8 cm that perhaps with some prolapse of the down we could get this  out in a transanal fashion and avoid a major resection for this small  benign-appearing lesion. Subsequently the area was further tattooed in the  event that we are wrong and cannot get it out transanally. The air was  aspirated and the scope was then removed. A digital exam was done and I can  get the tip of my finger up to and just over the top of this lesion as I  prolapse it down into the canal. I think that there is probably about a 70%  chance that we can get this out in a transanal fashion and avoid a  transabdominal operation altogether for her. If not, then we will simply  have to proceed with the laparoscopic low anterior resection as originally  planned. I have informed the patient and her family of this. She was taken  from endoscopy to the recovery area. She tolerated the exam nicely. She  remained stable and left with a benign abdomen. Will move forward this  afternoon with an initial attempt at transanal excision and if unsuccessful  move to a laparoscopic approach.          Tawni Pummel. Julious Payer, M.D.    cc: Tawni Pummel. Julious Payer, M.D.    DR. Meriam Sprague GOODE    WRT/wmx; D: 09/15/2008 3:45 P; T: 09/15/2008 9:35 P; Doc# [id number]; Job#  [id number]

## 2008-09-16 MED ADMIN — sumatriptan (IMITREX) tablet 25 mg: ORAL | @ 03:00:00 | NDC 00173046002

## 2008-09-16 MED ADMIN — escitalopram (LEXAPRO) tablet 20 mg: ORAL | @ 14:00:00 | NDC 00456201001

## 2008-09-16 MED ADMIN — levothyroxine (SYNTHROID) tablet 100 mcg: ORAL | @ 10:00:00 | NDC 00074662413

## 2008-09-16 MED ADMIN — enoxaparin (LOVENOX) injection 40 mg: SUBCUTANEOUS | @ 14:00:00 | NDC 00075062040

## 2008-09-16 MED ADMIN — pantoprazole (PROTONIX) tablet 40 mg: ORAL | @ 02:00:00 | NDC 00008084181

## 2008-09-16 MED ADMIN — acetaminophen (TYLENOL) tablet 650 mg: ORAL | @ 02:00:00 | NDC 51645070310

## 2008-09-16 MED ADMIN — cholecalciferol (VITAMIN D3) tablet 1,000 Units: ORAL | @ 14:00:00

## 2008-09-16 MED ADMIN — sumatriptan (IMITREX) tablet 25 mg: ORAL | @ 14:00:00 | NDC 00173073500

## 2008-09-16 MED ADMIN — psyllium (METAMUCIL SMOOTH TEXTURE) packet 1 Packet: ORAL | @ 22:00:00 | NDC 37000002304

## 2008-09-16 MED FILL — METAMUCIL SMOOTH TEXTURE ORAL PACKET: ORAL | Qty: 1

## 2008-09-16 MED FILL — SIMVASTATIN 40 MG TAB: 40 mg | ORAL | Qty: 1

## 2008-09-16 MED FILL — LEXAPRO 10 MG TABLET: 10 mg | ORAL | Qty: 2

## 2008-09-16 MED FILL — IMITREX 25 MG TABLET: 25 mg | ORAL | Qty: 1

## 2008-09-16 MED FILL — VITAMIN D3 10 MCG (400 UNIT) TABLET: 10 mcg (400 unit) | ORAL | Qty: 3

## 2008-09-16 MED FILL — SYNTHROID 100 MCG TABLET: 100 mcg | ORAL | Qty: 1

## 2008-09-16 MED FILL — VITAMIN C 500 MG TABLET: 500 mg | ORAL | Qty: 2

## 2008-09-16 MED FILL — MAPAP (ACETAMINOPHEN) 325 MG TABLET: 325 mg | ORAL | Qty: 2

## 2008-09-16 MED FILL — AMIDATE 2 MG/ML INTRAVENOUS SOLUTION: 2 mg/mL | INTRAVENOUS | Qty: 20

## 2008-09-16 MED FILL — CYANOCOBALAMIN 1,000 MCG/ML IJ SOLN: 1000 mcg/mL | INTRAMUSCULAR | Qty: 1

## 2008-09-16 MED FILL — LOVENOX 40 MG/0.4 ML SUBCUTANEOUS SYRINGE: 40 mg/0.4 mL | SUBCUTANEOUS | Qty: 1

## 2008-09-16 NOTE — Progress Notes (Signed)
Patient currently in bed and trying to get some rest. SCD's on legs. Patient has ambulated once for my shift in hallway with steady. Also, patient has ambulated several times to restroom overnight. Sitz bath offered and patient wants to wait until later. Patient agrees to do sitz bath and to ambulate in hallway again after she takes a nap. Patient has been belching through out the night. Patient was given protonix 40mg  po last night due to c/o acid reflux. HOB is elevated. Patient has been nibbling on crackers with no nausea. Patient's bell in reach.

## 2008-09-16 NOTE — Progress Notes (Signed)
Back in bed, call bell in reach; daughter in room; states "I'm ready to go home now"; waiting on Dr. Julious Payer.

## 2008-09-16 NOTE — Progress Notes (Signed)
Ambulating in hall w/ daughter.

## 2008-09-16 NOTE — Progress Notes (Signed)
Up in bed watching tv; denies pain

## 2008-09-16 NOTE — Progress Notes (Signed)
Up in chair, call bell in reach, working on crossword puzzle. Rates pain 3-4/10 but does not want any meds. Dressed & ready for Dr. Julious Payer. D/C IV from right wrist.

## 2008-09-16 NOTE — Discharge Summary (Signed)
Name: Leslie Potter, Leslie Potter Admitted: 09/15/2008  MR #: [id number] Discharged: 09/16/2008  Account #: [id number] DOB: Oct 22, 1940  Physician: Tawni Pummel. Julious Payer, M.D. 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    PROCEDURES PERFORMED THIS HOSPITALIZATION:  1. Transanal excision of rectal tubulovillous adenoma at 8-10 cm.  2. Flexible sigmoidoscopy.    SUMMARY OF HOSPITAL COURSE: The patient is a very nice 68 year old white  female from the [place] area who presented with a flat adenoma at what was  reported to be about 15 cm. She initially had been planned for a  laparoscopic low anterior resection but was brought into the hospital on  the day of admission for flexible sigmoidoscopy to be sure that the lesion  was sized and appropriate for an abdominal operation and not removable  endoscopically. Endoscopically we found that the lesion was axiated on the  right haustral valve at 8-10 cm and was small enough to be removed  transanally. Therefore, we cancelled the plans for abdominal operation and  performed this is in a transanal fashion. She underwent a transanal  excision of this lesion which has proven on final pathology to be  tubulovillous adenoma. The lesion was removed in its entirety, she  tolerated it well. By first postoperative day she was moving her bowels,  voiding, and her pain was controlled. She was in excellent shape overall.  She subsequently is discharged home on her admission medications. She will  take Tylenol for any discomfort. We will see her back in the office in 1-2  weeks. She should not engage in any heavy or strenuous activity. She may  shower or bathe and we will see her back as noted above. She subsequently  is discharged home having obtained maximal hospital benefit.        Reviewed on 09/17/2008 11:06 AM              Tawni Pummel. Julious Payer, M.D.    cc: Tawni Pummel. Julious Payer, M.D.     DR. Meriam Sprague GOODE    WRT/wmx; D: 09/16/2008 7:32 P; T: 09/16/2008 11:02 P; DOC# [id number]; Job#  [id number]

## 2008-09-16 NOTE — Progress Notes (Signed)
Up in bed, drinking coffee; watching tv; call bell in reach; introduced self to pt

## 2008-09-16 NOTE — Progress Notes (Signed)
Set up sitz bath; states she will get dressed when done

## 2008-09-16 NOTE — Progress Notes (Signed)
Doing great; pain controlled, moving bowels, voiding.  Path shows benign villous adenoma. Will discharge home

## 2008-09-16 NOTE — Progress Notes (Signed)
Resting in bed, call bell in reach; states headache 5/10; imitrex PO; does not have any pain from surgery.

## 2008-09-16 NOTE — Progress Notes (Signed)
Shaunda, TA assisted up to BR & set up sitz bath; only states scant bloody drng

## 2008-09-16 NOTE — Progress Notes (Signed)
SL IV & D/C PCA; stated she could get percocet PO prn

## 2008-09-16 NOTE — Progress Notes (Signed)
Patient up to restroom. Sitz bath done for the AM. Patient performed oral care and then ambulated in hallway.

## 2008-09-16 NOTE — Progress Notes (Signed)
Resting w/ eyes closed, call bell in reach; appears to have received relief from imitrex. Daughter in room

## 2008-09-19 LAB — TYPE + CROSSMATCH
ABO/Rh(D): B POS
Antibody screen: NEGATIVE

## 2010-03-16 ENCOUNTER — Encounter: Payer: Self-pay | Admitting: Cardiology

## 2010-03-29 ENCOUNTER — Encounter: Payer: Self-pay | Admitting: Cardiology

## 2010-04-19 ENCOUNTER — Encounter: Payer: Self-pay | Admitting: Cardiology

## 2010-05-08 ENCOUNTER — Ambulatory Visit: Payer: Self-pay | Admitting: Cardiology

## 2010-05-08 DIAGNOSIS — I498 Other specified cardiac arrhythmias: Secondary | ICD-10-CM | POA: Insufficient documentation

## 2010-05-08 DIAGNOSIS — I25119 Atherosclerotic heart disease of native coronary artery with unspecified angina pectoris: Secondary | ICD-10-CM | POA: Insufficient documentation

## 2010-05-08 DIAGNOSIS — E785 Hyperlipidemia, unspecified: Secondary | ICD-10-CM | POA: Insufficient documentation

## 2010-05-08 DIAGNOSIS — R079 Chest pain, unspecified: Secondary | ICD-10-CM | POA: Insufficient documentation

## 2010-05-22 ENCOUNTER — Encounter: Payer: Self-pay | Admitting: Cardiology

## 2010-05-31 ENCOUNTER — Ambulatory Visit: Payer: Self-pay | Admitting: Cardiology

## 2010-06-05 ENCOUNTER — Encounter: Payer: Self-pay | Admitting: Cardiology

## 2010-06-07 ENCOUNTER — Ambulatory Visit
Admission: RE | Admit: 2010-06-07 | Discharge: 2010-06-07 | Payer: Self-pay | Source: Home / Self Care | Attending: Cardiology | Admitting: Cardiology

## 2010-06-19 NOTE — Letter (Signed)
Summary: External Correspondence/ PROGRESS NOTE [facility]   External Correspondence/ PROGRESS NOTE [facility]   Imported By: Dorise Hiss 04/05/2010 09:53:57  _____________________________________________________________________  External Attachment:    Type:   Image     Comment:   External Document

## 2010-06-21 NOTE — Letter (Signed)
Summary: Internal Other/ PATIENT HISTORY FORM  Internal Other/ PATIENT HISTORY FORM   Imported By: Dorise Hiss 05/09/2010 11:48:50  _____________________________________________________________________  External Attachment:    Type:   Image     Comment:   External Document

## 2010-06-21 NOTE — Procedures (Signed)
Summary: Holter and Event/ CARDIONET END OF SERVICE SUMMARY REPORT  Holter and Event/ CARDIONET END OF SERVICE SUMMARY REPORT   Imported By: Dorise Hiss 05/25/2010 08:53:32  _____________________________________________________________________  External Attachment:    Type:   Image     Comment:   External Document  Appended Document: Holter and Event/ CARDIONET END OF SERVICE SUMMARY REPORT Left message on machine to call office per Carlye Grippe on 05/29/10  Appended Document: Holter and Event/ CARDIONET END OF SERVICE SUMMARY REPORT Results will be discussed with pt at office visit 1/19.

## 2010-06-21 NOTE — Cardiovascular Report (Signed)
Summary: Cardiac Catheterization  [facility]  Cardiac Catheterization  [facility]   Imported By: Dorise Hiss 05/09/2010 11:56:08  _____________________________________________________________________  External Attachment:    Type:   Image     Comment:   External Document

## 2010-06-21 NOTE — Progress Notes (Signed)
Summary: Office Visit/ BLOOD PRESSURE READINGS  Office Visit/ BLOOD PRESSURE READINGS   Imported By: Dorise Hiss 05/09/2010 12:04:28  _____________________________________________________________________  External Attachment:    Type:   Image     Comment:   External Document

## 2010-06-21 NOTE — Assessment & Plan Note (Signed)
Summary: 3 WK F/U PER 12/20 OV-JM   Visit Type:  Follow-up Primary Provider:  Dr. Isac Sarna   History of Present Illness: 70 year old woman presents for followup. She was seen back in December for further evaluation of bradycardia, also chest pain.  Outpatient cardiac monitor showed sinus rhythm with occasional artifact and a rare PAC. No significant pauses or marked bradycardia. She reports no frank spells of dizziness, and has had no syncope.  Lipids from October 2011 were reviewed, with total cholesterol 196, triglycerides 262, HDL 49, LDL 95. She has not been consistent with simvastatin, only taking it for the last few weeks. She is due to follow up with Dr. Margo Aye in February. She seems comfortable taking low dose of simvastatin, or perhaps even considering a switch to Pravachol if necessary. She did not tolerate Lipitor in the past, and is concerned about the cause of Crestor.  I reviewed her stress test results which are overall reassuring. This argues against any major progression in previously documented mild coronary atherosclerosis. At this point risk factor modification strategies should be pursued. Today we discussed diet and exercise. She will continue regular followup with Dr. Margo Aye.  Preventive Screening-Counseling & Management  Alcohol-Tobacco     Smoking Status: never  Current Medications (verified): 1)  Synthroid 112 Mcg Tabs (Levothyroxine Sodium) .... Take 1 Tablet By Mouth Once A Day 2)  Calcium-Magnesium 500-250 Mg Tabs (Calcium-Magnesium) .... Take 1 Tablet By Mouth Twice A Day 3)  Folic Acid 400 Mcg Tabs (Folic Acid) .... Take 1 Tablet By Mouth Once A Day 4)  D 5000 5000 Unit Tabs (Cholecalciferol) .... Take 1 Tablet By Mouth Once A Day 5)  Imitrex 25 Mg Tabs (Sumatriptan Succinate) .... Use As Directed 6)  Simvastatin 40 Mg Tabs (Simvastatin) .... Take One By Mouth Every 3 Days 7)  Cyanocobalamin 1000 Mcg/ml Soln (Cyanocobalamin) .... One Injection Once A  Week 8)  Quercetin 250 Mg Tabs (Quercetin) .... Take 1 Tablet By Mouth Two Times A Day 9)  Vitamin C 800mg  .... Take 3 Tablet By Mouth Two Times A Day 10)  Fish Oil 1000 Mg Caps (Omega-3 Fatty Acids) .... Take 1 Tablet By Mouth Two Times A Day 11)  Ostigen 350 Mg Misc (Calcium in Bone Mineral Cmplx) .... Take 2 Tablet By Mouth Two Times A Day (Bone and Mineral Supplement)  Allergies (verified): No Known Drug Allergies  Comments:  Nurse/Medical Assistant: The patient's medications and allergies were verbally reviewed with the patient and were updated in the Medication and Allergy Lists.  Past History:  Past Medical History: Last updated: 05/08/2010 Cataracts Hyperlipidemia Hypothyroidism Osteopenia Cholelithiasis Depression - mild History of positive PPD G E R D Arthritis Migraine headaches CAD - mild atherosclerosis, coronary calcium score 11.33 2006 Soy/wheat allergy  Social History: Last updated: 05/08/2010 Retired - Engineer, civil (consulting), [employer] [place]  Tobacco Use - No.  Alcohol Use - no  Review of Systems  The patient denies anorexia, fever, chest pain, syncope, dyspnea on exertion, peripheral edema, melena, and hematochezia.         Otherwise reviewed and negative.  Vital Signs:  Patient profile:   70 year old female Height:      63 inches Weight:      170 pounds Pulse rate:   65 / minute BP sitting:   129 / 70  (left arm) Cuff size:   regular  Vitals Entered By: Carlye Grippe (June 07, 2010 9:14 AM)  Physical Exam  Additional Exam:  Well-nourished  woman in no acute distress without active chest pain. HEENT: Conjunctiva and lids normal, oropharynx with moist mucosa. Neck: Supple, no elevated JVP or carotid bruits, no thyromegaly. Lungs: Clear to auscultation, nonlabored. Cardiac: Regular rate and rhythm, no significant systolic murmur, no S3. Abdomen: Soft, nontender, bowel sounds present. Skin: Warm and dry. Extremity: No pitting edema, distal pulses  full.   Nuclear Study  Procedure date:  06/05/2010  Findings:      Exercise Cardiolite, maximum workload 10.1 METs, no diagnostic ST segment changes, no chest pain. LVEF 77% with normal wall motion. Small, fixed apical anterior defect consistent with soft tissue attenuation, no ischemia.  Impression & Recommendations:  Problem # 1:  BRADYCARDIA (ICD-427.89)  Cardiac monitoring did not show any marked bradycardia or pauses. Suggest observation at this point. No specific intervention required.  Problem # 2:  CORONARY ATHEROSCLEROSIS NATIVE CORONARY ARTERY (ICD-414.01)  Only mild coronary atherosclerosis documented in the past, and recent Cardiolite demonstrates no frank ischemia to suggest significant progression. LVEF 77%. Recommend risk factor modification, medical therapy including low-dose aspirin, and aggressive lipid control. Diet and exercise were discussed. She will continue to follow with Dr. Margo Aye, and we can see her back on an annual basis, sooner if needed.  Problem # 3:  HYPERLIPIDEMIA (ICD-272.4)  We discussed therapies. She seems more comfortable taking a lower dose of simvastatin, perhaps 10 or 20 mg daily. If she can do this consistently without significant side effects, I suggested that she have a followup lipid panel with liver function tests and followup with Dr. Margo Aye. Need to get LDL around 70 if possible. Otherwise she could consider Pravachol, or perhaps Crestor if cost is not a major issue.  Her updated medication list for this problem includes:    Simvastatin 40 Mg Tabs (Simvastatin) .Marland Kitchen... Take one by mouth every 3 days  Patient Instructions: 1)  Your physician wants you to follow-up in: 1 year. You will receive a reminder letter in the mail one-two months in advance. If you don't receive a letter, please call our office to schedule the follow-up appointment. 2)  Your physician recommends that you continue on your current medications as directed. Please refer to the  Current Medication list given to you today.

## 2010-06-21 NOTE — Letter (Signed)
Summary: External Correspondence/ CORONARY CALCIUM SCANNING REPORT  External Correspondence/ CORONARY CALCIUM SCANNING REPORT   Imported By: Dorise Hiss 05/09/2010 12:06:47  _____________________________________________________________________  External Attachment:    Type:   Image     Comment:   External Document

## 2010-06-21 NOTE — Assessment & Plan Note (Signed)
Summary: np-bradycardia; dizziness;atypical cp   Visit Type:  Initial Consult Primary Provider:  Dr. 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 She reports a history of dizziness, described as a feeling of vertigo, typically in the evenings, sometimes when she gets up at night time, episodically noted since October. She reports having some dental work done with anesthesia at that time and was also on Celexa. Since then symptoms have improved, although not resolved. She has had no frank syncope. She has noted that her heart rate has been relatively slow, sometimes as low as the 40s at rest in the evening.  She also reports sporadic chest "pressure" typically on the left side, not specifically with exertion. This has been present over several months. Can last anywhere from a few minutes to several hours. She has prior history of recurrent chest pain dating back several years, has undergone previous extensive cardiac testing, detailed below. She does have evidence of mild coronary artery disease and hyperlipidemia. She reports having some trouble tolerating Zocor related to muscle weakness. LDL has been as high as the 180s in the past.  Faxed copy of ECG from 10 November showed sinus bradycardia at 47 beats per minute, normal PR interval and corrected QT interval.  Home blood pressure and heart rate record was reviewed. She has very good blood pressure control. Heart rates typically in the 60s, sometimes the 50s during the daytime.  She has not undergone any followup cardiac testing since 2009.  Preventive Screening-Counseling & Management  Alcohol-Tobacco     Smoking Status: never  Current Medications (verified): 1)  Synthroid 112 Mcg Tabs (Levothyroxine Sodium) .... Take 1 Tablet By Mouth Once A Day 2)  Calcium-Magnesium 500-250 Mg Tabs (Calcium-Magnesium) .... Take 1 Tablet By Mouth Twice A Day 3)  Folic Acid 400 Mcg Tabs  (Folic Acid) .... Take 1 Tablet By Mouth Once A Day 4)  D 5000 5000 Unit Tabs (Cholecalciferol) .... Take 1 Tablet By Mouth Once A Day 5)  Imitrex 25 Mg Tabs (Sumatriptan Succinate) .... Use As Directed 6)  Actonel 150 Mg Tabs (Risedronate Sodium) .... Take One By Mouth Monthly 7)  Simvastatin 40 Mg Tabs (Simvastatin) .... Take One By Mouth Every 3 Days 8)  Cyanocobalamin 1000 Mcg/ml Soln (Cyanocobalamin) .... One Injection Once A Week 9)  Quercetin 250 Mg Tabs (Quercetin) .... Take 1 Tablet By Mouth Two Times A Day 10)  Vitamin C 800mg  .... Take 3 Tablet By Mouth Two Times A Day 11)  Fish Oil 1000 Mg Caps (Omega-3 Fatty Acids) .... Take 1 Tablet By Mouth Two Times A Day 12)  Ostigen 350 Mg Misc (Calcium in Bone Mineral Cmplx) .... Take 2 Tablet By Mouth Two Times A Day (Bone and Mineral Supplement)  Allergies (verified): No Known Drug Allergies  Comments:  Nurse/Medical Assistant: The patient's medication list and allergies were reviewed with the patient and were updated in the Medication and Allergy Lists.  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  Alcohol Use - no  Past Medical History:  Cataracts Hyperlipidemia Hypothyroidism Osteopenia Cholelithiasis Depression - mild History of positive PPD G E R D Arthritis Migraine headaches CAD - mild atherosclerosis, coronary calcium score 11.33 2006 Soy/wheat allergy  Past Surgical History: Cataract surgery Colonic polyp resection Bilateral tubal ligation  Family History: Father: died age 84  with history of MI and ventricular rupture Mother: died age 53 with CHF and mitral stenosis Siblings: brother died age 18 with MI and COPD, sister died age 31 with MI after CABG, sister alive age 38 status post pacemaker with sick sinus syndrome, brother age 70 with hyperlipidemia and hypertension  Social History: Retired Audiological scientist, 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  Alcohol Use - no Smoking Status:  never  Review of Systems       The patient complains of headaches.  The patient denies anorexia, fever, weight loss, syncope, peripheral edema, prolonged cough, hemoptysis, abdominal pain, melena, and hematochezia.         Some nocturia. Muscle weakness related to Zocor. NYHA Class II dyspnea on exertion. Occasional palpitations. Otherwise reviewed and negative except as outlined above.  Vital Signs:  Patient profile:   70 year old female Height:      63 inches Weight:      167 pounds BMI:     29.69 Pulse rate:   64 / minute BP sitting:   114 / 79  (left arm) Cuff size:   regular  Vitals Entered By: Carlye Grippe (May 08, 2010 2:52 PM)  Nutrition Counseling: Patient's BMI is greater than 25 and therefore counseled on weight management options.   Physical Exam  Additional Exam:  Well-nourished woman in no acute distress without active chest pain. HEENT: Conjunctiva and lids normal, oropharynx with moist mucosa. Neck: Supple, no elevated JVP or carotid bruits, no thyromegaly. Lungs: Clear to auscultation, nonlabored. Cardiac: Regular rate and rhythm, no significant systolic murmur, no S3. Abdomen: Soft, nontender, bowel sounds present. Skin: Warm and dry. Extremity: No pitting edema, distal pulses full. Musculoskeletal: No gross deformities. Neuropsychiatric: Alert and oriented x3, affect appropriate.   Cardiac Cath  Procedure date:  03/31/2006  Findings:      [facility]:  LAD is large vessel proximally, tapering to a small caliber distally, giving off  several small to medium-sized diagonal branches, 20-30% stenosis within the midportion of the LAD. Left circumflex is nondominant with several large obtuse marginals, no obstructive disease. RCA is dominant with PDA, and no significant obstructive CAD. LVEF estimated at 55% with no mitral regurgitation.  Carotid Doppler  Procedure date:  11/09/2007  Findings:      Small amount of non-shadowing plaque noted within the carotid bulb region bilaterally with no evidence of hemodynamically significant stenosis in either internal carotid artery.  Stress Echocardiogram  Procedure date:  01/14/2008  Findings:      No diagnostic ST segment changes at 10.1 METs, no chest pain reported. Peak blood pressure 145/80. Patient achieved 93% of the age predicted heart rate response. No evidence of exercise-induced ischemia by echocardiogram.  Impression & Recommendations:  Problem # 1:  BRADYCARDIA (ZOX-096.04)  Not certain if this is clinically significant or not at this point. She has experienced some dizziness, described more as a vertigo-like feeling, typically in the evenings. She has not manifested frank syncope. Heart rate at baseline seems to be in the 50s to 60s, perhaps lower in the evening. Plan will be to place a 7 day CardioNet monitor and try to correlate symptoms with rhythm or rate.  Orders:  Nuclear Med (Nuc Med) Cardionet/Event Monitor (Cardionet/Event)  Problem # 2:  CHEST PAIN UNSPECIFIED (ICD-786.50)  Somewhat atypical in description, however noted in the setting of previously documented mild coronary atherosclerosis with active risk factors including significant family history and hyperlipidemia. Last ischemic evaluation was in 2009. Plan will be a followup exercise Cardiolite on medical therapy. I will bring her back to the office to review the results.  Orders: Nuclear Med (Nuc Med) Cardionet/Event Monitor (Cardionet/Event)  Problem # 3:  CORONARY ATHEROSCLEROSIS NATIVE CORONARY  ARTERY (ICD-414.01)  Overall mild based on previous assessment including coronary calcium score, cardiac catheterization, and more recently an exercise echocardiogram in 2009. Today we discussed more aggressive risk factor modification. Also regular exercise.  Orders: Nuclear Med (Nuc Med)  Problem # 4:  HYPERLIPIDEMIA (ICD-272.4)  Will request most recent lipid panel for review. She has not been able to tolerate Zocor on a regular basis related to leg fatigue. We might be able to discuss some changes, perhaps a different statin medication going forward if numbers are not optimally controlled.  Her updated medication list for this problem includes:    Simvastatin 40 Mg Tabs (Simvastatin) .Marland Kitchen... Take one by mouth every 3 days  Patient Instructions: 1)  Follow up with Dr. Diona Browner on Thursday, June 07, 2010 at [facility]. 2)  Your physician has requested that you have an exercise stress cardiolite.  For further information please visit [email or web address]  Please follow instruction sheet, as given. 3)  Your physician has recommended that you wear an event monitor.  Event monitors are medical devices that record the heart's electrical activity. Doctors most often use these monitors to diagnose arrhythmias. Arrhythmias are problems with the speed or rhythm of the heartbeat. The monitor is a small, portable device. You can wear one while you do your normal daily activities. This is usually used to diagnose what is causing palpitations/syncope (passing out).

## 2011-06-25 ENCOUNTER — Encounter: Payer: Self-pay | Admitting: Cardiology

## 2011-06-26 ENCOUNTER — Encounter: Payer: Self-pay | Admitting: Cardiology

## 2011-06-26 ENCOUNTER — Ambulatory Visit (INDEPENDENT_AMBULATORY_CARE_PROVIDER_SITE_OTHER): Payer: Medicare Other | Admitting: Cardiology

## 2011-06-26 VITALS — BP 110/76 | HR 64 | Ht 63.0 in | Wt 163.0 lb

## 2011-06-26 DIAGNOSIS — I498 Other specified cardiac arrhythmias: Secondary | ICD-10-CM

## 2011-06-26 DIAGNOSIS — I2510 Atherosclerotic heart disease of native coronary artery without angina pectoris: Secondary | ICD-10-CM

## 2011-06-26 DIAGNOSIS — E785 Hyperlipidemia, unspecified: Secondary | ICD-10-CM

## 2011-06-26 MED ORDER — SIMVASTATIN 20 MG PO TABS: 20.0000 mg | ORAL_TABLET | Freq: Every evening | ORAL | Status: DC

## 2011-06-26 NOTE — Assessment & Plan Note (Signed)
We reviewed her numbers from October 2012. Suggested that she increase simvastatin to 20 mg daily, and if she tolerates this, follow up with lab work per Dr. Reola Calkins. Alternatively, she could consider switching to Lipitor or potentially Crestor.

## 2011-06-26 NOTE — Assessment & Plan Note (Signed)
History of atherosclerosis with nonischemic Cardiolite last year. No active angina symptoms. ECG is normal. Would be optimal if her LDL were under 100.

## 2011-06-26 NOTE — Assessment & Plan Note (Addendum)
Heart rate is good today, continue observation.

## 2011-06-26 NOTE — Patient Instructions (Signed)
Your physician you to follow up in 1 year. You will receive a reminder letter in the mail one-two months in advance. If you don't receive a letter, please call our office to schedule the follow-up appointment. Increase Zocor (simvastatin) to 20 mg every night. You may take 2 of your 10 mg tablets until gone and then start the 20 mg tablets. Have your primary MD continue to follow your cholesterol labs.

## 2011-06-26 NOTE — Progress Notes (Signed)
Clinical Summary Amanda Jennings is a 71 y.o.female presenting for followup. She was seen in January of last year. She presents describing no unusual fatigue, chest pain, or increasing shortness of breath. Her followup ECG is reviewed below.  She did bring copies of lab work from October 2012 showing LDL 143, HDL 51, cholesterol 235, triglycerides 203. She continues on omega-3 supplements and low-dose simvastatin. We discussed options for more aggressive control.  She has not been exercising regularly, we did discuss this today. Her hemoglobin A1c was 5.4% in October 2012.  Since the departure of Dr. Margo Aye, she has been getting her routine lab work with Dr. Reola Calkins in [place].   No Known Allergies  Current Outpatient Prescriptions  Medication Sig Dispense Refill  . Ascorbic Acid (VITAMIN C ER PO) Take 4 tablets by mouth 2 (two) times daily.      . Calcium in Bone Mineral Cmplx (OSTIGEN) 350 MG MISC Take 2 each by mouth 2 (two) times daily.      . Calcium-Magnesium 500-250 MG TABS Take 1 tablet by mouth daily.       . Cholecalciferol (VITAMIN D3) 5000 UNITS CAPS Take 1 capsule by mouth daily.      . cyanocobalamin (,VITAMIN B-12,) 1000 MCG/ML injection Inject 1,000 mcg into the muscle 2 (two) times a week.       . escitalopram (LEXAPRO) 20 MG tablet Take 1 tablet by mouth Daily.      . folic acid (FOLVITE) 400 MCG tablet Take 400 mcg by mouth daily.      Marland Kitchen levothyroxine (SYNTHROID, LEVOTHROID) 112 MCG tablet Take 112 mcg by mouth daily.      Marland Kitchen liothyronine (CYTOMEL) 5 MCG tablet Take 1 tablet by mouth Daily.      . Omega-3 Fatty Acids (FISH OIL) 1000 MG CAPS Take 1 capsule by mouth 2 (two) times daily.      . SUMAtriptan (IMITREX) 25 MG tablet Take 25 mg by mouth every 2 (two) hours as needed.      . simvastatin (ZOCOR) 20 MG tablet Take 1 tablet (20 mg total) by mouth every evening.  30 tablet  6    Past Medical History  Diagnosis Date  . Hyperlipidemia   . Hypothyroidism   . Cataracts,  bilateral   . Osteopenia   . Cholelithiasis   . Depression   . History of positive PPD   . Gastro - esophageal reflux disease   . Arthritis   . Hx of migraines   . Coronary atherosclerosis of native coronary artery     Mild atherosclerosis  . Bradycardia     Social History Ms. Wey reports that she has never smoked. She has never used smokeless tobacco. Ms. Mena reports that she does not drink alcohol.  Review of Systems No palpitations, dizziness. Reports stable appetite. Indicates that she had the "flu" this winter. No reported bleeding problems. No orthopnea or PND. Otherwise negative.  Physical Examination Filed Vitals:   06/26/11 1305  BP: 110/76  Pulse: 64   Well-nourished woman in no acute distress.  HEENT: Conjunctiva and lids normal, oropharynx with moist mucosa.  Neck: Supple, no elevated JVP or carotid bruits, no thyromegaly.  Lungs: Clear to auscultation, nonlabored.  Cardiac: Regular rate and rhythm, no significant systolic murmur, no S3.  Abdomen: Soft, nontender, bowel sounds present.  Skin: Warm and dry.  Extremities: No pitting edema, distal pulses full. Musculoskeletal: No kyphosis. Neuropsychiatric: Alert and oriented x3, affect appropriate.   ECG Normal sinus  rhythm at 61.  Studies Exercise Cardiolite 06/05/2010, maximum workload 10.1 METs, no diagnostic ST segment changes, no chest pain. LVEF 77% with normal wall motion. Small, fixed apical anterior defect consistent with soft tissue attenuation, no ischemia.   Problem List and Plan

## 2012-04-29 ENCOUNTER — Other Ambulatory Visit: Payer: Self-pay | Admitting: Cardiology

## 2012-04-30 NOTE — Telephone Encounter (Signed)
rx sent to pharmacy by e-script Pt recall noted in chart for upcoming apt for follow up

## 2012-07-17 ENCOUNTER — Encounter: Payer: Self-pay | Admitting: Cardiology

## 2012-07-17 ENCOUNTER — Ambulatory Visit (INDEPENDENT_AMBULATORY_CARE_PROVIDER_SITE_OTHER): Payer: Medicare Other | Admitting: Cardiology

## 2012-07-17 VITALS — BP 129/78 | HR 62 | Ht 64.0 in | Wt 166.0 lb

## 2012-07-17 DIAGNOSIS — E785 Hyperlipidemia, unspecified: Secondary | ICD-10-CM

## 2012-07-17 DIAGNOSIS — I2510 Atherosclerotic heart disease of native coronary artery without angina pectoris: Secondary | ICD-10-CM

## 2012-07-17 NOTE — Patient Instructions (Addendum)
Your physician recommends that you schedule a follow-up appointment in: 1 year. You will receive a reminder letter in the mail in about 10 months reminding you to call and schedule your appointment. If you don't receive this letter, please contact our office. Your physician recommends that you continue on your current medications as directed. Please start taking an aspirin 81 mg daily. Please refer to the Current Medication list given to you today.

## 2012-07-17 NOTE — Progress Notes (Signed)
Clinical Summary Amanda Jennings is a 72 y.o.female presenting for followup. She was last seen in February 2013.  Exercise Cardiolite 06/05/2010, maximum workload 10.1 METs, no diagnostic ST segment changes, no chest pain. LVEF 77% with normal wall motion. Small, fixed apical anterior defect consistent with soft tissue attenuation, no ischemia.  ECG today shows sinus rhythm with poor R wave progression. She reports intermittent atypical left-sided thoracic discomfort, nonexertional. Medications reviewed below.  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 She reports that stress has not been too significant. Has not been exercising however. We did discuss this.  I reviewed her most recent lipid panel, LDL was 136. Optimally this should be under 100. She does not want to increase her Zocor dose at this time.  No Known Allergies  Current Outpatient Prescriptions  Medication Sig Dispense Refill  . Ascorbic Acid (VITAMIN C ER PO) Take 4 tablets by mouth 2 (two) times daily.      Marland Kitchen aspirin EC 81 MG tablet Take 81 mg by mouth daily.      . Calcium in Bone Mineral Cmplx (OSTIGEN) 350 MG MISC Take 2 each by mouth 2 (two) times daily.      . Calcium-Magnesium 500-250 MG TABS Take 1 tablet by mouth daily.       Marland Kitchen CHERATUSSIN DAC 30-10-100 MG/5ML solution Take 5 mLs by mouth at bedtime as needed.       . Cholecalciferol (VITAMIN D3) 5000 UNITS CAPS Take 1 capsule by mouth daily.      . cyanocobalamin (,VITAMIN B-12,) 1000 MCG/ML injection Inject 1,000 mcg into the muscle 2 (two) times a week.       . escitalopram (LEXAPRO) 20 MG tablet Take 1 tablet by mouth Daily.      . folic acid (FOLVITE) 400 MCG tablet Take 400 mcg by mouth daily.      Marland Kitchen levothyroxine (SYNTHROID, LEVOTHROID) 112 MCG tablet Take 112 mcg by mouth daily.      Marland Kitchen liothyronine (CYTOMEL) 5 MCG tablet Take 1 tablet by mouth Daily.      . Omega-3 Fatty Acids (FISH OIL) 1000 MG CAPS Take 1 capsule  by mouth 2 (two) times daily.      . Quercetin 250 MG TABS Take 1 tablet by mouth 2 (two) times daily.      . simvastatin (ZOCOR) 20 MG tablet TAKE ONE TABLET BY MOUTH IN THE EVENING  30 tablet  2  . SUMAtriptan (IMITREX) 25 MG tablet Take 25 mg by mouth every 2 (two) hours as needed.       No current facility-administered medications for this visit.    Past Medical History  Diagnosis Date  . Hyperlipidemia   . Hypothyroidism   . Cataracts, bilateral   . Osteopenia   . Cholelithiasis   . Depression   . History of positive PPD   . Gastro - esophageal reflux disease   . Arthritis   . Hx of migraines   . Coronary atherosclerosis of native coronary artery     Mild atherosclerosis  . Bradycardia     Social History Amanda Jennings reports that she has never smoked. She has never used smokeless tobacco. Amanda Jennings reports that she does not drink alcohol.  Review of Systems No palpitations or syncope. No claudication. Stable appetite. Otherwise negative.  Physical Examination Filed Vitals:   07/17/12 0809  BP: 129/78  Pulse: 62   Filed Weights   07/17/12  0809  Weight: 166 lb (75.297 kg)    Well-nourished woman in no acute distress.  HEENT: Conjunctiva and lids normal, oropharynx with moist mucosa.  Neck: Supple, no elevated JVP or carotid bruits, no thyromegaly.  Lungs: Clear to auscultation, nonlabored.  Cardiac: Regular rate and rhythm, no significant systolic murmur, no S3.  Skin: Warm and dry.  Extremities: No pitting edema, distal pulses full.     Problem List and Plan   CORONARY ATHEROSCLEROSIS NATIVE CORONARY ARTERY History of mild atherosclerosis, low risk followup Myoview in 2012. ECG reviewed today. At this point would continue medical therapy and observation. Would add aspirin 81 mg daily. Regular exercise regimen also discussed. Annual followup arranged.  HYPERLIPIDEMIA Optimally LDL goal should be under 100. I have reviewed this with her. She plans to  increase her exercise, still watching her diet. She is tolerating Zocor 20 mg, although does not want to increase the dose at this time.    Jonelle Sidle, M.D., F.A.C.C.

## 2012-07-17 NOTE — Assessment & Plan Note (Signed)
History of mild atherosclerosis, low risk followup Myoview in 2012. ECG reviewed today. At this point would continue medical therapy and observation. Would add aspirin 81 mg daily. Regular exercise regimen also discussed. Annual followup arranged.

## 2012-07-17 NOTE — Assessment & Plan Note (Signed)
Optimally LDL goal should be under 100. I have reviewed this with her. She plans to increase her exercise, still watching her diet. She is tolerating Zocor 20 mg, although does not want to increase the dose at this time.

## 2012-08-24 ENCOUNTER — Other Ambulatory Visit: Payer: Self-pay | Admitting: Cardiology

## 2012-11-12 ENCOUNTER — Encounter: Payer: Self-pay | Admitting: Physician Assistant

## 2012-11-12 ENCOUNTER — Ambulatory Visit (INDEPENDENT_AMBULATORY_CARE_PROVIDER_SITE_OTHER): Payer: Medicare Other | Admitting: Physician Assistant

## 2012-11-12 VITALS — BP 125/74 | HR 69 | Ht 64.0 in | Wt 168.0 lb

## 2012-11-12 DIAGNOSIS — R609 Edema, unspecified: Secondary | ICD-10-CM

## 2012-11-12 DIAGNOSIS — R0602 Shortness of breath: Secondary | ICD-10-CM

## 2012-11-12 DIAGNOSIS — I2510 Atherosclerotic heart disease of native coronary artery without angina pectoris: Secondary | ICD-10-CM

## 2012-11-12 DIAGNOSIS — R079 Chest pain, unspecified: Secondary | ICD-10-CM

## 2012-11-12 MED ORDER — HYDROCHLOROTHIAZIDE 12.5 MG PO CAPS: 12.5000 mg | ORAL_CAPSULE | Freq: Every day | ORAL | Status: DC

## 2012-11-12 NOTE — Progress Notes (Signed)
Primary Cardiologist: Simona Huh, MD   HPI: Patient seen as an add-on for evaluation of peripheral edema.  Patient presents with recent development of peripheral edema with associated DOE, nonproductive cough, and 2 pillow orthopnea. She denied PND, exertional CP, or tachycardia palpitations. She estimated a weight gain of approximately 5 pounds over that time frame, currently 2 pounds heavier than at time of last OV.  She took 1 of her mother's furosemide tablets, of unknown dose, but which resulted in a brisk diuretic response and improvement in her peripheral edema. That was approximately 10 days ago. She is not on any maintenance diuretic medication.  No Known Allergies  Current Outpatient Prescriptions  Medication Sig Dispense Refill  . Ascorbic Acid (VITAMIN C ER PO) Take 4 tablets by mouth 2 (two) times daily.      Marland Kitchen aspirin EC 81 MG tablet Take 81 mg by mouth daily.      . Calcium in Bone Mineral Cmplx (OSTIGEN) 350 MG MISC Take 2 each by mouth 2 (two) times daily.      . Calcium-Magnesium 500-250 MG TABS Take 1 tablet by mouth daily.       Marland Kitchen CHERATUSSIN DAC 30-10-100 MG/5ML solution Take 5 mLs by mouth at bedtime as needed.       . Cholecalciferol (VITAMIN D3) 5000 UNITS CAPS Take 1 capsule by mouth daily.      . cyanocobalamin (,VITAMIN B-12,) 1000 MCG/ML injection Inject 1,000 mcg into the muscle 2 (two) times a week.       . escitalopram (LEXAPRO) 20 MG tablet Take 1 tablet by mouth Daily.      . folic acid (FOLVITE) 400 MCG tablet Take 400 mcg by mouth daily.      Marland Kitchen levothyroxine (SYNTHROID, LEVOTHROID) 112 MCG tablet Take 112 mcg by mouth daily.      Marland Kitchen liothyronine (CYTOMEL) 5 MCG tablet Take 1 tablet by mouth Daily.      . Omega-3 Fatty Acids (FISH OIL) 1000 MG CAPS Take 1 capsule by mouth 2 (two) times daily.      . Quercetin 250 MG TABS Take 1 tablet by mouth 2 (two) times daily.      . simvastatin (ZOCOR) 20 MG tablet TAKE ONE TABLET BY MOUTH IN THE EVENING  30 tablet  0   . SUMAtriptan (IMITREX) 25 MG tablet Take 25 mg by mouth every 2 (two) hours as needed.      . hydrochlorothiazide (MICROZIDE) 12.5 MG capsule Take 1 capsule (12.5 mg total) by mouth daily.  90 capsule  3   No current facility-administered medications for this visit.    Past Medical History  Diagnosis Date  . Hyperlipidemia   . Hypothyroidism   . Cataracts, bilateral   . Osteopenia   . Cholelithiasis   . Depression   . History of positive PPD   . Gastro - esophageal reflux disease   . Arthritis   . Hx of migraines   . Coronary atherosclerosis of native coronary artery     Mild atherosclerosis  . Bradycardia     Past Surgical History  Procedure Laterality Date  . Cataract surgery    . Colonic polyp resection    . Bilateral tubal ligation      History   Social History  . Marital Status: Divorced    Spouse Name: N/A    Number of Children: N/A  . Years of Education: N/A   Occupational History  . RETIRED      NURSE  Social History Main Topics  . Smoking status: Never Smoker   . Smokeless tobacco: Never Used  . Alcohol Use: No  . Drug Use: No  . Sexually Active: Not on file   Other Topics Concern  . Not on file   Social History Narrative  . No narrative on file    No family history on file.  ROS: no nausea, vomiting; no fever, chills; no melena, hematochezia; no claudication  PHYSICAL EXAM: BP 125/74  Pulse 69  Ht 5\' 4"  (1.626 m)  Wt 168 lb (76.204 kg)  BMI 28.82 kg/m2 GENERAL: 72 year old female; NAD HEENT: NCAT, PERRLA, EOMI; sclera clear; no xanthelasma NECK: palpable bilateral carotid pulses, no bruits; positive JVD on the right, 90 LUNGS: CTA bilaterally CARDIAC: RRR (S1, S2); soft, 1-2/6 short systolic ejection murmur at base; no rubs or gallops ABDOMEN: soft, non-tender; intact BS EXTREMETIES: Trace bilateral peripheral edema SKIN: warm/dry; no obvious rash/lesions MUSCULOSKELETAL: no joint deformity NEURO: no focal deficit; NL  affect   EKG:    ASSESSMENT & PLAN:  Peripheral edema Will order a complete echocardiogram for reassessment of LVF, as well as RV function. Will check complete blood work, including BNP level, and a 2v CXR. Will start low-dose diuretic HCTZ 12.5 mg daily, and reassess clinical status with me in 2 weeks. In the meanwhile, patient is to refrain from added salt in her diet, keep her legs elevated whenever possible, and continue to monitor her weight daily.  CORONARY ATHEROSCLEROSIS NATIVE CORONARY ARTERY No current indication for a repeat ischemic evaluation. She has history of mild, nonobstructive CAD, by prior cardiac catheterization in 2010 [facility] [place] She also had a negative GXT Cardiolite; EF 77% with NL wall motion, 05/2010.    Gene Amand Lemoine, PAC

## 2012-11-12 NOTE — Patient Instructions (Addendum)
Your physician recommends that you schedule a follow-up appointment in: 2 weeks. Your physician has recommended you make the following change in your medication: Start hydrochlorothiazide 12. 5 mg daily. Your new prescription has been sent to your pharmacy. All other medications will remain the same. Your physician has requested that you have an echocardiogram. Echocardiography is a painless test that uses sound waves to create images of your heart. It provides your doctor with information about the size and shape of your heart and how well your heart's chambers and valves are working. This procedure takes approximately one hour. There are no restrictions for this procedure. Your physician recommends that you return for lab work in: 1 week around November 19, 2012 at [facility] for [employer]. Your physician recommends that you return for lab work and chest x-ray today at [facility] for CMET,CBC,BNP.

## 2012-11-12 NOTE — Assessment & Plan Note (Addendum)
Will order a complete echocardiogram for reassessment of LVF, as well as RV function. Will check complete blood work, including BNP level, and a 2v CXR. Will start low-dose diuretic HCTZ 12.5 mg daily, and reassess clinical status with me in 2 weeks. In the meanwhile, patient is to refrain from added salt in her diet, keep her legs elevated whenever possible, and continue to monitor her weight daily.

## 2012-11-12 NOTE — Assessment & Plan Note (Signed)
No current indication for a repeat ischemic evaluation. She has history of mild, nonobstructive CAD by prior cardiac catheterization in 2010 [facility] [place] She also had a negative GXT Cardiolite; EF 77% with NL wall motion, 05/2010.

## 2012-11-13 ENCOUNTER — Telehealth: Payer: Self-pay | Admitting: *Deleted

## 2012-11-13 ENCOUNTER — Encounter: Payer: Self-pay | Admitting: Physician Assistant

## 2012-11-13 NOTE — Telephone Encounter (Signed)
Patient informed. 

## 2012-11-13 NOTE — Telephone Encounter (Signed)
Message copied by Eustace Moore on Fri Nov 13, 2012  4:00 PM ------      Message from: Rande Brunt      Created: Fri Nov 13, 2012  3:52 PM       NL labs ------

## 2012-11-18 ENCOUNTER — Other Ambulatory Visit: Payer: Self-pay

## 2012-11-18 ENCOUNTER — Other Ambulatory Visit (INDEPENDENT_AMBULATORY_CARE_PROVIDER_SITE_OTHER): Payer: Medicare Other

## 2012-11-18 DIAGNOSIS — I2510 Atherosclerotic heart disease of native coronary artery without angina pectoris: Secondary | ICD-10-CM

## 2012-11-18 DIAGNOSIS — R0602 Shortness of breath: Secondary | ICD-10-CM

## 2012-11-19 ENCOUNTER — Telehealth: Payer: Self-pay | Admitting: *Deleted

## 2012-11-19 NOTE — Telephone Encounter (Signed)
Notes Recorded by Lesle Chris, LPN on 10/21/4032 at 10:39 AM Patient notified and verbalized understanding. Already has follow up scheduled for July 11 with Gene Serpe, PA.

## 2012-11-19 NOTE — Telephone Encounter (Signed)
Message copied by Lesle Chris on Thu Nov 19, 2012 10:39 AM ------      Message from: Prescott Parma C      Created: Wed Nov 18, 2012  3:21 PM       NL LVF and RVF ------

## 2012-11-24 ENCOUNTER — Telehealth: Payer: Self-pay | Admitting: Cardiology

## 2012-11-24 NOTE — Telephone Encounter (Signed)
Message copied by Burnice Logan on Tue Nov 24, 2012 12:58 PM ------      Message from: MCDOWELL, Illene Bolus      Created: Thu Nov 19, 2012  3:08 PM       Renal function normal, potassium 3.8. ------

## 2012-11-24 NOTE — Telephone Encounter (Signed)
Pt informed of normal labs.

## 2012-11-27 ENCOUNTER — Encounter: Payer: Self-pay | Admitting: Physician Assistant

## 2012-11-27 ENCOUNTER — Ambulatory Visit (INDEPENDENT_AMBULATORY_CARE_PROVIDER_SITE_OTHER): Payer: Medicare Other | Admitting: Physician Assistant

## 2012-11-27 VITALS — BP 107/73 | HR 79 | Ht 64.0 in | Wt 168.0 lb

## 2012-11-27 DIAGNOSIS — R609 Edema, unspecified: Secondary | ICD-10-CM

## 2012-11-27 DIAGNOSIS — I2510 Atherosclerotic heart disease of native coronary artery without angina pectoris: Secondary | ICD-10-CM

## 2012-11-27 DIAGNOSIS — E785 Hyperlipidemia, unspecified: Secondary | ICD-10-CM

## 2012-11-27 NOTE — Patient Instructions (Signed)
Continue all current medications. Your physician wants you to follow up in: 6 months.  You will receive a reminder letter in the mail one-two months in advance.  If you don't receive a letter, please call our office to schedule the follow up appointment   

## 2012-11-27 NOTE — Assessment & Plan Note (Signed)
Remains quiescent on current medication regimen.

## 2012-11-27 NOTE — Progress Notes (Signed)
Primary Cardiologist: Simona Huh, MD   HPI: Scheduled 2 week followup.  She returns today reporting significant improvement in her peripheral edema, following the addition of low-dose HCTZ.   - Followup laboratory data, July 2: BUN 17, creatinine 0.7, potassium 3.8. BNP 25. CBC NL. CXR: No pulmonary edema.   - Echocardiogram, July 2: NL LVF/RVF; no focal WMAs; grade 1 diastolic dysfunction  No Known Allergies  Current Outpatient Prescriptions  Medication Sig Dispense Refill  . Ascorbic Acid (VITAMIN C ER PO) Take 4 tablets by mouth 2 (two) times daily.      Marland Kitchen aspirin EC 81 MG tablet Take 81 mg by mouth daily.      . Calcium in Bone Mineral Cmplx (OSTIGEN) 350 MG MISC Take 2 each by mouth 2 (two) times daily.      . Calcium-Magnesium 500-250 MG TABS Take 1 tablet by mouth daily.       Marland Kitchen CHERATUSSIN DAC 30-10-100 MG/5ML solution Take 5 mLs by mouth at bedtime as needed.       . Cholecalciferol (VITAMIN D3) 5000 UNITS CAPS Take 1 capsule by mouth daily.      . cyanocobalamin (,VITAMIN B-12,) 1000 MCG/ML injection Inject 1,000 mcg into the muscle 2 (two) times a week.       . escitalopram (LEXAPRO) 20 MG tablet Take 1 tablet by mouth Daily.      . folic acid (FOLVITE) 400 MCG tablet Take 400 mcg by mouth daily.      . hydrochlorothiazide (MICROZIDE) 12.5 MG capsule Take 1 capsule (12.5 mg total) by mouth daily.  90 capsule  3  . levothyroxine (SYNTHROID, LEVOTHROID) 112 MCG tablet Take 112 mcg by mouth daily.      Marland Kitchen liothyronine (CYTOMEL) 5 MCG tablet Take 1 tablet by mouth Daily.      . Omega-3 Fatty Acids (FISH OIL) 1000 MG CAPS Take 1 capsule by mouth 2 (two) times daily.      . Quercetin 250 MG TABS Take 1 tablet by mouth 2 (two) times daily.      . simvastatin (ZOCOR) 20 MG tablet TAKE ONE TABLET BY MOUTH IN THE EVENING  30 tablet  0  . SUMAtriptan (IMITREX) 25 MG tablet Take 25 mg by mouth every 2 (two) hours as needed.       No current facility-administered medications for this  visit.    Past Medical History  Diagnosis Date  . Hyperlipidemia   . Hypothyroidism   . Cataracts, bilateral   . Osteopenia   . Cholelithiasis   . Depression   . History of positive PPD   . Gastro - esophageal reflux disease   . Arthritis   . Hx of migraines   . Coronary atherosclerosis of native coronary artery     Mild atherosclerosis  . Bradycardia     Past Surgical History  Procedure Laterality Date  . Cataract surgery    . Colonic polyp resection    . Bilateral tubal ligation      History   Social History  . Marital Status: Divorced    Spouse Name: N/A    Number of Children: N/A  . Years of Education: N/A   Occupational History  . RETIRED      NURSE   Social History Main Topics  . Smoking status: Never Smoker   . Smokeless tobacco: Never Used  . Alcohol Use: No  . Drug Use: No  . Sexually Active: Not on file   Other Topics Concern  .  Not on file   Social History Narrative  . No narrative on file    No family history on file.  ROS: no nausea, vomiting; no fever, chills; no melena, hematochezia; no claudication  PHYSICAL EXAM: BP 107/73  Pulse 79  Ht 5\' 4"  (1.626 m)  Wt 168 lb (76.204 kg)  BMI 28.82 kg/m2 GENERAL: 72 year old female; NAD  HEENT: NCAT, PERRLA, EOMI; sclera clear; no xanthelasma  NECK: Neck veins flat  LUNGS: CTA bilaterally  CARDIAC: RRR (S1, S2); no significant murmurs; no rubs or gallops  ABDOMEN: soft, non-tender; intact BS  EXTREMETIES: Trace bilateral peripheral edema  SKIN: warm/dry; no obvious rash/lesions  MUSCULOSKELETAL: no joint deformity  NEURO: no focal deficit; NL affect    EKG:    ASSESSMENT & PLAN:  Peripheral edema Significantly improved following the addition of low-dose HCTZ. NL RVF by echocardiography. Suspect secondary to venous insufficiency. Followup labs stable. Continue current dose HCTZ and reassess at next OV.  CORONARY ATHEROSCLEROSIS NATIVE CORONARY ARTERY Remains quiescent on current  medication regimen.  HYPERLIPIDEMIA On low-dose statin therapy, followed by primary M.D. Target LDL 100 or less, if feasible.    Gene Ramesha Poster, PAC

## 2012-11-27 NOTE — Assessment & Plan Note (Signed)
Significantly improved following the addition of low-dose HCTZ. NL RVF by echocardiography. Suspect secondary to venous insufficiency. Followup labs stable. Continue current dose HCTZ and reassess at next OV.

## 2012-11-27 NOTE — Assessment & Plan Note (Addendum)
On low-dose statin therapy, followed by primary M.D. Target LDL 100 or less, if feasible.

## 2012-11-30 ENCOUNTER — Other Ambulatory Visit: Payer: Self-pay | Admitting: Cardiology

## 2012-11-30 MED ORDER — SIMVASTATIN 20 MG PO TABS: 20.0000 mg | ORAL_TABLET | Freq: Every day | ORAL | Status: DC

## 2013-02-08 ENCOUNTER — Ambulatory Visit: Payer: Medicare Other | Admitting: Cardiology

## 2013-07-14 ENCOUNTER — Encounter: Payer: Self-pay | Admitting: Cardiology

## 2013-07-14 ENCOUNTER — Ambulatory Visit (INDEPENDENT_AMBULATORY_CARE_PROVIDER_SITE_OTHER): Payer: Medicare Other | Admitting: Cardiology

## 2013-07-14 VITALS — BP 115/73 | HR 56 | Ht 64.0 in | Wt 169.0 lb

## 2013-07-14 DIAGNOSIS — I2510 Atherosclerotic heart disease of native coronary artery without angina pectoris: Secondary | ICD-10-CM

## 2013-07-14 DIAGNOSIS — E785 Hyperlipidemia, unspecified: Secondary | ICD-10-CM

## 2013-07-14 NOTE — Patient Instructions (Signed)

## 2013-07-14 NOTE — Assessment & Plan Note (Signed)
Symptomatically stable with history of mild coronary atherosclerosis. Continue medical therapy, diet, and exercise. ECG reviewed. Annual followup anticipated.

## 2013-07-14 NOTE — Assessment & Plan Note (Signed)
Reports that she has been more consistent with her Zocor, followup lab work pending per primary care. Ideally she should get her LDL under 100, and close to 70 if possible.

## 2013-07-14 NOTE — Progress Notes (Signed)
Clinical Summary Amanda Jennings is a 73 y.o.female last in the office by Mr. Serpe PA-C in June 2014. She has done well without angina symptoms or progressive shortness of breath. Leg edema is better on low-dose diuretic. Her blood pressure looks very good today.  Lab work from October 2014 reviewed. At that time she states she was not taking her Zocor regularly. LDL went up to 152 with high small particle size. She states she is back on her statin and will have followup lab work soon.  Echocardiogram in July 2014 demonstrated LVEF 60-65%, grade 1 diastolic dysfunction, mildly sclerotic aortic valve, trivial mitral regurgitation, mildly dilated right ventricle with normal contraction, trivial tricuspid regurgitation with normal PASP 21 mm mercury.  Exercise Myoview from January 2012 showed no diagnostic ST segment changes, attenuation artifact without ischemia, LVEF 77%.  ECG today shows sinus bradycardia. No significant weight change compared to last visit. She has been doing yoga since the beginning of the year, also plans to increase her walking regimen.   No Known Allergies  Current Outpatient Prescriptions  Medication Sig Dispense Refill  . Ascorbic Acid (VITAMIN C ER PO) Take 4 tablets by mouth 2 (two) times daily.      . Calcium in Bone Mineral Cmplx (OSTIGEN) 350 MG MISC Take 2 each by mouth 2 (two) times daily.      . Calcium-Magnesium 500-250 MG TABS Take 1 tablet by mouth daily.       Marland Kitchen CHERATUSSIN DAC 30-10-100 MG/5ML solution Take 5 mLs by mouth at bedtime as needed.       . Cholecalciferol (VITAMIN D3) 5000 UNITS CAPS Take 1 capsule by mouth daily.      . folic acid (FOLVITE) 400 MCG tablet Take 400 mcg by mouth daily.      . hydrochlorothiazide (MICROZIDE) 12.5 MG capsule Take 1 capsule (12.5 mg total) by mouth daily.  90 capsule  3  . levothyroxine (SYNTHROID, LEVOTHROID) 112 MCG tablet Take 112 mcg by mouth daily.      . Omega-3 Fatty Acids (FISH OIL) 1000 MG CAPS Take 1  capsule by mouth 2 (two) times daily.      . Quercetin 250 MG TABS Take 1 tablet by mouth 2 (two) times daily.      . simvastatin (ZOCOR) 20 MG tablet Take 1 tablet (20 mg total) by mouth at bedtime.  30 tablet  8  . SUMAtriptan (IMITREX) 25 MG tablet Take 25 mg by mouth every 2 (two) hours as needed.      . vitamin B-12 (CYANOCOBALAMIN) 1000 MCG tablet Take 1,000 mcg by mouth daily.       No current facility-administered medications for this visit.    Past Medical History  Diagnosis Date  . Hyperlipidemia   . Hypothyroidism   . Cataracts, bilateral   . Osteopenia   . Cholelithiasis   . Depression   . History of positive PPD   . Gastro - esophageal reflux disease   . Arthritis   . Hx of migraines   . Coronary atherosclerosis of native coronary artery     Mild atherosclerosis  . Bradycardia     Social History Amanda Jennings reports that she has never smoked. She has never used smokeless tobacco. Amanda Jennings reports that she does not drink alcohol.  Review of Systems No palpitations or syncope. No claudication. Stable appetite. Otherwise as outlined.  Physical Examination Filed Vitals:   07/14/13 0948  BP: 115/73  Pulse: 56   Filed  Weights   07/14/13 0948  Weight: 169 lb (76.658 kg)    Well-nourished woman in no acute distress.  HEENT: Conjunctiva and lids normal, oropharynx with moist mucosa.  Neck: Supple, no elevated JVP or carotid bruits, no thyromegaly.  Lungs: Clear to auscultation, nonlabored.  Cardiac: Regular rate and rhythm, no significant systolic murmur, no S3.  Skin: Warm and dry.  Extremities: No pitting edema, distal pulses full.    Problem List and Plan   CORONARY ATHEROSCLEROSIS NATIVE CORONARY ARTERY Symptomatically stable with history of mild coronary atherosclerosis. Continue medical therapy, diet, and exercise. ECG reviewed. Annual followup anticipated.  HYPERLIPIDEMIA Reports that she has been more consistent with her Zocor, followup lab  work pending per primary care. Ideally she should get her LDL under 100, and close to 70 if possible.    Jonelle SidleSamuel G. Aoki Wedemeyer, M.D., F.A.C.C.

## 2013-11-17 ENCOUNTER — Telehealth: Payer: Self-pay | Admitting: Cardiology

## 2013-11-17 MED ORDER — HYDROCHLOROTHIAZIDE 12.5 MG PO CAPS: 12.5000 mg | ORAL_CAPSULE | Freq: Every day | ORAL | Status: DC

## 2013-11-17 NOTE — Telephone Encounter (Signed)
Needs refill on HYDROCHLOROTHIAZIDE 12.5 MG PO CAPS Medication [facility] [place]

## 2013-12-09 ENCOUNTER — Other Ambulatory Visit: Payer: Self-pay | Admitting: *Deleted

## 2013-12-09 MED ORDER — SIMVASTATIN 20 MG PO TABS: 20.0000 mg | ORAL_TABLET | Freq: Every day | ORAL | Status: DC

## 2014-07-14 ENCOUNTER — Other Ambulatory Visit: Payer: Self-pay | Admitting: *Deleted

## 2014-07-14 ENCOUNTER — Encounter: Payer: Self-pay | Admitting: Cardiology

## 2014-07-14 ENCOUNTER — Ambulatory Visit (INDEPENDENT_AMBULATORY_CARE_PROVIDER_SITE_OTHER): Payer: Medicare Other | Admitting: Cardiology

## 2014-07-14 VITALS — BP 110/82 | HR 64 | Ht 64.0 in | Wt 169.0 lb

## 2014-07-14 DIAGNOSIS — R05 Cough: Secondary | ICD-10-CM

## 2014-07-14 DIAGNOSIS — E782 Mixed hyperlipidemia: Secondary | ICD-10-CM

## 2014-07-14 DIAGNOSIS — R0602 Shortness of breath: Secondary | ICD-10-CM

## 2014-07-14 DIAGNOSIS — R059 Cough, unspecified: Secondary | ICD-10-CM

## 2014-07-14 DIAGNOSIS — I2510 Atherosclerotic heart disease of native coronary artery without angina pectoris: Secondary | ICD-10-CM

## 2014-07-14 MED ORDER — PRAVASTATIN SODIUM 20 MG PO TABS: 20.0000 mg | ORAL_TABLET | Freq: Every evening | ORAL | Status: DC

## 2014-07-14 NOTE — Progress Notes (Signed)
Cardiology Office Note  Date: 07/14/2014   ID: Amanda Jennings, DOB [birth year], MRN [id number]  PCP: Charma IgoGOOD-KANAWATI, BEVERLY, MD  Primary Cardiologist: Nona DellSamuel Kierrah Kilbride, MD   Chief Complaint  Patient presents with  . Coronary Artery Disease  . Shortness of Breath    History of Present Illness: Amanda Jennings is a 74 y.o. female last seen in February 2015. She presents today for a routine follow-up visit. She states that back in November 2015 she had a fall, slipped while getting out of the bathtub. Following this she was diagnosed with thoracic compression fractures, these are being managed conservatively without anticipated surgery. Since that time, in particular in the last several weeks she has noticed an increased feeling of shortness of breath and also a dry nagging cough. Does not feel that it is related to any sinus difficulties, but she does have some sinus drainage. She has had no obvious fevers or chills, cough is nonproductive. She does not report any orthopnea or PND.  Cardiac history is outlined below including interval testing. Last echocardiogram was in 2014. ECG today shows no acute changes.  I reviewed her most recent lab work from January. She reports a feeling of muscle achiness on Zocor. We discussed trying a different statin preparation.   Past Medical History  Diagnosis Date  . Hyperlipidemia   . Hypothyroidism   . Cataracts, bilateral   . Osteopenia   . Cholelithiasis   . Depression   . History of positive PPD   . Gastro - esophageal reflux disease   . Arthritis   . Hx of migraines   . Coronary atherosclerosis of native coronary artery     Mild atherosclerosis  . Bradycardia     Past Surgical History  Procedure Laterality Date  . Cataract surgery    . Colonic polyp resection    . Bilateral tubal ligation      Current Outpatient Prescriptions  Medication Sig Dispense Refill  . Ascorbic Acid (VITAMIN C ER PO) Take 4 tablets by mouth 2 (two)  times daily.    . Calcium in Bone Mineral Cmplx (OSTIGEN) 350 MG MISC Take 2 each by mouth 2 (two) times daily.    . Calcium-Magnesium 500-250 MG TABS Take 1 tablet by mouth daily.     Marland Kitchen. CHERATUSSIN DAC 30-10-100 MG/5ML solution Take 5 mLs by mouth at bedtime as needed.     . Cholecalciferol (VITAMIN D3) 5000 UNITS CAPS Take 1 capsule by mouth daily.    . folic acid (FOLVITE) 400 MCG tablet Take 400 mcg by mouth daily.    . hydrochlorothiazide (MICROZIDE) 12.5 MG capsule Take 1 capsule (12.5 mg total) by mouth daily. 90 capsule 3  . levothyroxine (SYNTHROID, LEVOTHROID) 112 MCG tablet Take 112 mcg by mouth daily.    . Omega-3 Fatty Acids (FISH OIL) 1000 MG CAPS Take 1 capsule by mouth 2 (two) times daily.    . pravastatin (PRAVACHOL) 20 MG tablet Take 1 tablet (20 mg total) by mouth every evening. 90 tablet 3  . Quercetin 250 MG TABS Take 1 tablet by mouth as needed.     . SUMAtriptan (IMITREX) 25 MG tablet Take 25 mg by mouth every 2 (two) hours as needed.     . vitamin B-12 (CYANOCOBALAMIN) 1000 MCG tablet Take 1,000 mcg by mouth daily.     No current facility-administered medications for this visit.    Allergies:  Ciprofloxacin   Social History: The patient  reports that she has never smoked. She  has never used smokeless tobacco. She reports that she does not drink alcohol or use illicit drugs.   ROS:  Please see the history of present illness. Otherwise, complete review of systems is positive for none.  All other systems are reviewed and negative.    Physical Exam: VS:  BP 110/82 mmHg  Pulse 64  Ht  (1.626 m)  Wt 169 lb (76.658 kg)  BMI 28.99 kg/m2  SpO2 96%, BMI Body mass index is 28.99 kg/(m^2).  Wt Readings from Last 3 Encounters:  07/14/14 169 lb (76.658 kg)  07/14/13 169 lb (76.658 kg)  11/27/12 168 lb (76.204 kg)     Well-nourished woman in no acute distress.  HEENT: Conjunctiva and lids normal, oropharynx with moist mucosa.  Neck: Supple, no elevated JVP or  carotid bruits, no thyromegaly.  Lungs: Clear to auscultation, nonlabored.  Cardiac: Regular rate and rhythm, no significant systolic murmur, no S3.  Abdomen: Soft, nontender. Skin: Warm and dry.  Extremities: No pitting edema, distal pulses full.  Musculoskeletal: No kyphosis. Neuropsychiatric: Alert and oriented 3, affect appropriate.   ECG: ECG is ordered today and reviewed showing sinus rhythm with left axis deviation and decreased anterior R-wave progression.   Recent Labwork:  Lab work from January showed LDL 134, HDL 54, triglycerides 240, cholesterol 236, hemoglobin 15.0, platelets 300, BUN 11, creatinine 0.7, potassium 4.6, AST 14, ALT 11, hemoglobin A1c 5.3, TSH 4.4, CRP 3.0.  Other Studies Reviewed Today:  1. Echocardiogram in July 2014 demonstrated LVEF 60-65%, grade 1 diastolic dysfunction, mildly sclerotic aortic valve, trivial mitral regurgitation, mildly dilated right ventricle with normal contraction, trivial tricuspid regurgitation with normal PASP 21 mm mercury.  2. Exercise Myoview from January 2012 showed no diagnostic ST segment changes, attenuation artifact without ischemia, LVEF 77%.  Assessment and Plan:  1. Shortness of breath and dry cough. Lungs are relatively clear today. Wonder whether this is related to any splinting with thoracic pain due to her compression fractures, she reports no productive cough, could also be related to sinus disease/drainage. She does state that it has been progressive however, and we will obtain a follow-up echocardiogram as well as PA and lateral chest x-ray.  2. History of CAD, mild and nonobstructive. No active angina symptoms. ECG without acute changes.  3. Hyperlipidemia. Plan to switch from Zocor to Pravachol to see if she tolerates this better. She is to call us back to let us know how she is doing.  Current medicines are reviewed at length with the patient today.  The patient has concerns regarding medicines. As noted  above, will change from Zocor to Pravachol.   Orders Placed This Encounter  Procedures  . DG Chest 2 View  . EKG 12-Lead  . 2D Echocardiogram without contrast    Disposition: Call with testing and if reassuring FU with me in 1 year.   Signed, Jonelle Sidle, MD, [facility] 07/14/2014 4:02 PM    [facility] [address] [place], [place], [place] [address] Phone: [phone]; Fax: [phone]

## 2014-07-14 NOTE — Patient Instructions (Signed)
Your physician recommends that you schedule a follow-up appointment in: 1 year. You will receive a reminder letter in the mail in about 10 months reminding you to call and schedule your appointment. If you don't receive this letter, please contact our office. Your physician has recommended you make the following change in your medication:  Stop simvastatin. Start pravastatin 20 mg daily. Continue all other medications the same. A chest x-ray takes a picture of the organs and structures inside the chest, including the heart, lungs, and blood vessels. This test can show several things, including, whether the heart is enlarges; whether fluid is building up in the lungs; and whether pacemaker / defibrillator leads are still in place. Your physician has requested that you have an echocardiogram. Echocardiography is a painless test that uses sound waves to create images of your heart. It provides your doctor with information about the size and shape of your heart and how well your heart's chambers and valves are working. This procedure takes approximately one hour. There are no restrictions for this procedure.

## 2014-07-15 ENCOUNTER — Encounter: Payer: Self-pay | Admitting: Cardiology

## 2014-07-18 ENCOUNTER — Telehealth: Payer: Self-pay | Admitting: *Deleted

## 2014-07-18 NOTE — Telephone Encounter (Signed)
Patient informed. 

## 2014-07-18 NOTE — Telephone Encounter (Signed)
-----   Message from Jonelle SidleSamuel G McDowell, MD sent at 07/15/2014  7:55 AM EST ----- Reviewed. Let know that there were no acute abnormalities, stable thoracic spine compression abnormalities, no evidence of pneumonia.

## 2014-07-21 ENCOUNTER — Other Ambulatory Visit (INDEPENDENT_AMBULATORY_CARE_PROVIDER_SITE_OTHER): Payer: Medicare Other

## 2014-07-21 ENCOUNTER — Other Ambulatory Visit: Payer: Self-pay

## 2014-07-21 DIAGNOSIS — R0602 Shortness of breath: Secondary | ICD-10-CM | POA: Diagnosis not present

## 2014-07-21 DIAGNOSIS — I499 Cardiac arrhythmia, unspecified: Secondary | ICD-10-CM

## 2014-07-21 DIAGNOSIS — I2510 Atherosclerotic heart disease of native coronary artery without angina pectoris: Secondary | ICD-10-CM

## 2014-07-22 ENCOUNTER — Encounter: Payer: Self-pay | Admitting: *Deleted

## 2014-08-08 NOTE — Telephone Encounter (Signed)
Patient informed nurse that she was aware of results but didn't know how to respond to message in [facility]. Patient also informed nurse that she was doing fine with taking pravastatin and didn't have any problems so far.

## 2014-10-11 ENCOUNTER — Other Ambulatory Visit: Payer: Self-pay | Admitting: *Deleted

## 2014-10-11 MED ORDER — PRAVASTATIN SODIUM 20 MG PO TABS: 20.0000 mg | ORAL_TABLET | Freq: Every evening | ORAL | Status: DC

## 2014-12-08 ENCOUNTER — Telehealth: Payer: Self-pay | Admitting: Cardiology

## 2014-12-08 NOTE — Telephone Encounter (Signed)
Amanda Jennings called wanting to know if Dr. Diona Browner would consider writing an order for her to have another Coronary Calcium Scanning. She states that the last one was done on 08/01/2004.

## 2014-12-08 NOTE — Telephone Encounter (Signed)
Spoke with pt and she understood she would discuss at f/u visit

## 2014-12-08 NOTE — Telephone Encounter (Signed)
This is probably not the best way to follow-up on her coronary atherosclerosis at this time. Can discuss other options at office follow-up.

## 2015-01-05 ENCOUNTER — Encounter

## 2015-01-06 ENCOUNTER — Inpatient Hospital Stay: Payer: MEDICARE

## 2015-01-06 LAB — POC H. PYLORI, TISSUE
H. pylori from tissue: NEGATIVE
Lot no.: 143056
Negative control: NEGATIVE
Positive control: POSITIVE

## 2015-01-06 MED ORDER — SODIUM CHLORIDE 0.9 % IJ SYRG
INTRAMUSCULAR | Status: DC | PRN
Start: 2015-01-06 — End: 2015-01-06

## 2015-01-06 MED ORDER — MIDAZOLAM 1 MG/ML IJ SOLN
1 mg/mL | INTRAMUSCULAR | Status: DC | PRN
Start: 2015-01-06 — End: 2015-01-06

## 2015-01-06 MED ORDER — ATROPINE 0.1 MG/ML SYRINGE
0.1 mg/mL | Freq: Once | INTRAMUSCULAR | Status: DC | PRN
Start: 2015-01-06 — End: 2015-01-06

## 2015-01-06 MED ORDER — SODIUM CHLORIDE 0.9 % IV
INTRAVENOUS | Status: DC
Start: 2015-01-06 — End: 2015-01-06
  Administered 2015-01-06: 16:00:00 via INTRAVENOUS

## 2015-01-06 MED ORDER — PROPOFOL 10 MG/ML IV EMUL
10 mg/mL | INTRAVENOUS | Status: DC | PRN
Start: 2015-01-06 — End: 2015-01-06
  Administered 2015-01-06 (×3): via INTRAVENOUS

## 2015-01-06 MED ORDER — EPINEPHRINE 0.1 MG/ML SYRINGE
0.1 mg/mL | Freq: Once | INTRAMUSCULAR | Status: DC | PRN
Start: 2015-01-06 — End: 2015-01-06

## 2015-01-06 MED ORDER — SODIUM CHLORIDE 0.9 % IJ SYRG
Freq: Three times a day (TID) | INTRAMUSCULAR | Status: DC
Start: 2015-01-06 — End: 2015-01-06

## 2015-01-06 MED ORDER — LIDOCAINE (PF) 20 MG/ML (2 %) IJ SOLN
20 mg/mL (2 %) | INTRAMUSCULAR | Status: DC | PRN
Start: 2015-01-06 — End: 2015-01-06
  Administered 2015-01-06: 17:00:00 via INTRAVENOUS

## 2015-01-06 MED ORDER — FLUMAZENIL 0.1 MG/ML IV SOLN
0.1 mg/mL | INTRAVENOUS | Status: DC | PRN
Start: 2015-01-06 — End: 2015-01-06

## 2015-01-06 MED ORDER — SIMETHICONE 40 MG/0.6 ML ORAL DROPS, SUSP
40 mg/0.6 mL | ORAL | Status: DC | PRN
Start: 2015-01-06 — End: 2015-01-06

## 2015-01-06 MED ORDER — OMEPRAZOLE 40 MG CAP, DELAYED RELEASE
40 mg | ORAL_CAPSULE | Freq: Every day | ORAL | 6 refills | Status: DC
Start: 2015-01-06 — End: 2015-06-19

## 2015-01-06 MED ORDER — NALOXONE 0.4 MG/ML INJECTION
0.4 mg/mL | INTRAMUSCULAR | Status: DC | PRN
Start: 2015-01-06 — End: 2015-01-06

## 2015-01-06 MED FILL — SODIUM CHLORIDE 0.9 % IV: INTRAVENOUS | Qty: 1000

## 2015-01-06 NOTE — Procedures (Signed)
 [facility]                   Endoscopic Gastroduodenoscopy Procedure Note      01/06/2015  Marda StalkerDarnell Pina  [birth year]  [id number]    Procedure: Endoscopic Gastroduodenoscopy with biopsy    Indication:  Abdominal pain, epigastric, Abdominal pain, RUQ, Dyspepsia-acid     Pre-operative Diagnosis: see indication above    Post-operative Diagnosis: see findings below    Operator: P. Ninetta LightsFrederick Duckworth, Montez HagemanJr. MD    Referring Provider:  Not On File      Anesthesia/Sedation:  MAC anesthesia Propofol    Airway assessment: No airway problems anticipated    Pre-Procedural Exam:      Airway: clear, no airway problems anticipated  Heart: RRR, without gallops or rubs  Lungs: clear bilaterally without wheezes, crackles, or rhonchi  Abdomen: soft, nontender, nondistended, bowel sounds present  Mental Status: awake, alert and oriented to person, place and time       Procedure Details     After infomed consent was obtained for the procedure, with all risks and benefits of procedure explained the patient was taken to the endoscopy suite and placed in the left lateral decubitus position.  Following sequential administration of sedation as per above, the endoscope was inserted into the mouth and advanced under direct vision to second portion of the duodenum.  A careful inspection was made as the gastroscope was withdrawn, including a retroflexed view of the proximal stomach; findings and interventions are described below.      Findings:   Esophagus:  Distal erosive esophagitis, biopsied. Small hiatal hernia  Stomach: diffuse gastritis, biopsied for pyloritek. 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 Duodenal biopsies  2. Biopsies of GE junction           Complications:   None; patient tolerated the procedure well.    EBL:  None.            Impression:       -Erosive esophagitis  -Gastritis  -Gastric diverticulum  -Small hiatal hernia  -Duodenitis    Recommendations:  -Acid suppression with a proton pump inhibitor---omeprazole 40 mg daily., -Await pathology., -Await pyloritek test result and treat for Helicobacter pylori if positive., -Follow symptoms., -HIDA scan with CCK    Herbert MoorsPaul Frederick Duckworth Jr, MD,01/06/2015

## 2015-01-06 NOTE — Anesthesia Pre-Procedure Evaluation (Addendum)
Anesthetic History   No history of anesthetic complications            Review of Systems / Medical History  Patient summary reviewed, nursing notes reviewed and pertinent labs reviewed    Pulmonary      Recent URI             Neuro/Psych         Psychiatric history     Cardiovascular              CAD (minimal athersclerosis, medically managed per pt.)    Exercise tolerance: >4 METS     GI/Hepatic/Renal     GERD           Endo/Other      Hypothyroidism  Arthritis     Other Findings            Physical Exam    Airway  Mallampati: III  TM Distance: 4 - 6 cm  Neck ROM: normal range of motion   Mouth opening: Diminished (comment)     Cardiovascular  Regular rate and rhythm,  S1 and S2 normal,  no murmur, click, rub, or gallop             Dental    Dentition: Bridges     Pulmonary  Breath sounds clear to auscultation               Abdominal  GI exam deferred       Other Findings            Anesthetic Plan    ASA: 2  Anesthesia type: MAC            Anesthetic plan and risks discussed with: Patient      Caution with permanent upper, front teeth bridge

## 2015-01-06 NOTE — Procedures (Signed)
Procedures by Herbert Moorsuckworth, Vladimir Lenhoff Frederick Jr., MD at 01/06/15 1242                Author: Herbert Moorsuckworth, Shekita Boyden Frederick Jr., MD  Service: Gastroenterology  Author Type: Physician       Filed: 01/06/15 1247  Date of Service: 01/06/15 1242  Status: Signed          Editor: Herbert Moorsuckworth, Bhavana Kady Frederick Jr., MD (Physician)            Pre-procedure Diagnoses        1. RUQ pain [R10.11]        2. Epigastric pain [R10.13]        3. Dyspepsia [R10.13]                           Post-procedure Diagnoses        1. Erosive esophagitis [K22.10]        2. Gastritis [K29.70]        3. Duodenitis [K29.80]        4. Gastric diverticulum [K31.4]                           Procedures        1. UPPER GI ENDOSCOPY,BIOPSY [[id number]][[id number]]                              [name]                         Endoscopic Gastroduodenoscopy Procedure Note         01/06/2015   Leslie StalkerDarnell Potter   1940-06-26   [id number]      Procedure: Endoscopic Gastroduodenoscopy with biopsy      Indication:  Abdominal pain, epigastric, Abdominal pain, RUQ, Dyspepsia-acid       Pre-operative Diagnosis: see indication above      Post-operative Diagnosis: see findings below      Operator: P. Ninetta LightsFrederick Duckworth, Montez HagemanJr. MD      Referring Provider:  Not On File         Anesthesia/Sedation:  MAC anesthesia Propofol      Airway assessment: No airway problems anticipated      Pre-Procedural Exam:        Airway: clear, no airway problems anticipated   Heart: RRR, without gallops or rubs   Lungs: clear bilaterally without wheezes, crackles, or rhonchi   Abdomen: soft, nontender, nondistended, bowel sounds present   Mental Status: awake, alert and oriented to person, place and time         Procedure Details       After infomed consent was obtained for the procedure, with all risks and benefits of procedure explained the patient was taken to the endoscopy suite and placed in the left lateral decubitus position.  Following sequential administration of  sedation as  per above, the endoscope was inserted into the mouth and advanced under direct vision to second portion of the duodenum.  A careful inspection was made as the gastroscope was withdrawn, including a retroflexed view of the proximal stomach; findings and  interventions are described below.        Findings:    Esophagus:  Distal erosive esophagitis, biopsied. Small hiatal hernia   Stomach: diffuse gastritis, biopsied for pyloritek. 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 Duodenal biopsies  2. Biopsies of GE junction             Complications:   None; patient tolerated the procedure well.      EBL:  None.              Impression:       -Erosive esophagitis   -Gastritis   -Gastric diverticulum   -Small hiatal hernia   -Duodenitis      Recommendations:   -Acid suppression with a proton pump inhibitor---omeprazole 40 mg daily., -Await pathology., -Await pyloritek test result and treat for Helicobacter pylori if positive., -Follow symptoms., -HIDA scan with CCK      Herbert Moors, MD,01/06/2015

## 2015-01-06 NOTE — Anesthesia Post-Procedure Evaluation (Signed)
Post-Anesthesia Evaluation and Assessment    Patient: Leslie Potter MRN: [id number]  SSN: [id number]    Date of Birth: May 02, 1941  Age: 74 y.o.  Sex: female       Cardiovascular Function/Vital Signs  Visit Vitals   ??? BP 122/48   ??? Pulse 69   ??? Temp 36.6 ??C (97.8 ??F)   ??? Resp 20   ??? Ht 5\' 4"  (1.626 m)   ??? Wt 72.8 kg (160 lb 7 oz)   ??? SpO2 98%   ??? Breastfeeding No   ??? BMI 27.54 kg/m2       Patient is status post MAC anesthesia for Procedure(s):  ESOPHAGOGASTRODUODENOSCOPY (EGD)  ESOPHAGOGASTRODUODENAL (EGD) BIOPSY.    Nausea/Vomiting: None    Postoperative hydration reviewed and adequate.    Pain:  Pain Scale 1: Numeric (0 - 10) (01/06/15 1253)  Pain Intensity 1: 0 (01/06/15 1253)   Managed    Neurological Status:       At baseline    Mental Status and Level of Consciousness: Alert and oriented     Pulmonary Status:   O2 Device: Room air (01/06/15 1253)   Adequate oxygenation and airway patent    Complications related to anesthesia: None    Post-anesthesia assessment completed. No concerns    Signed By: Freddy Jaksch, MD     January 06, 2015

## 2015-01-06 NOTE — Progress Notes (Signed)
Clotest obtained

## 2015-01-06 NOTE — H&P (Signed)
[name], MD  [facility]  [address], [address]  [place], [place] [id number]  [phone]  [email or web address]      See office H and P.  No interval change.    Date of Surgery Update:  Leslie Potter was seen and examined.  History and physical has been reviewed. The patient has been examined. There have been no significant clinical changes since the completion of the originally dated History and Physical.    Signed By: Herbert MoorsPaul Frederick Duckworth Jr, MD, MD     January 06, 2015 12:33 PM

## 2015-01-06 NOTE — Progress Notes (Signed)
Marda StalkerDarnell Faubert  Apr 27, 1941  [id number]    Situation:  Verbal report received from: Ihor GullyGiGi Williams RN  Procedure: Procedure(s):  ESOPHAGOGASTRODUODENOSCOPY (EGD)  ESOPHAGOGASTRODUODENAL (EGD) BIOPSY    Background:    Preoperative diagnosis: RUQ PAIN, EPIGASTRIC PAIN, DYSPEPSIA, CHOLELITHIASIS  Postoperative diagnosis: esophagitis; gastritis; duodenitis    Operator:  Dr. Sandie Anouckworth  Assistant(s): Endoscopy Technician-1: Emiliano Dyerhris Crocker  Endoscopy RN-1: Lorine BearsGillian F Williams, RN    Specimens:   ID Type Source Tests Collected by Time Destination   1 : duodenum bx Preservative Duodenum  Herbert MoorsPaul Frederick Duckworth Jr., MD 01/06/2015 1244 Pathology   2 : GE junction bx Preservative   Erenest RasherPaul Frederick Wyvonnia Duskyuckworth Jr., MD 01/06/2015 1245 Pathology     H. Pylori  yes    Assessment:  Intra-procedure medications     Anesthesia gave intra-procedure sedation and medications, see anesthesia flow sheet yes    Intravenous fluids: NS@ KVO     Vital signs stable  yes    Abdominal assessment: round and soft  yes    Recommendation:  Discharge patient per MD order yes.  Family or Friend  yes  Permission to share finding with family or friend yes

## 2015-01-06 NOTE — Progress Notes (Signed)
Anesthesia reports 200 mg Propofol, 40 mg Lidocaine and 500 ml of Normal Saline were given during procedure. Received report from anesthesia staff on vital signs and status of patient.

## 2015-01-06 NOTE — Telephone Encounter (Signed)
Patient's daughter made an appt for her mother for Aug 30. She states Dr Sandie Anouckworth would like her seen sooner. Please call if you get a opening.

## 2015-01-09 ENCOUNTER — Inpatient Hospital Stay: Admit: 2015-01-09 | Payer: MEDICARE | Attending: Gastroenterology | Primary: Internal Medicine

## 2015-01-09 DIAGNOSIS — R1013 Epigastric pain: Secondary | ICD-10-CM

## 2015-01-09 MED ORDER — TECHNETIUM TC 99M MEBROFENIN
Freq: Once | Status: AC
Start: 2015-01-09 — End: 2015-01-09
  Administered 2015-01-09: 16:00:00 via INTRAVENOUS

## 2015-01-09 MED ORDER — SINCALIDE 5 MCG IJ SOLR
5 mcg | Freq: Once | INTRAMUSCULAR | Status: AC
Start: 2015-01-09 — End: 2015-01-09
  Administered 2015-01-09: 17:00:00 via INTRAVENOUS

## 2015-01-09 MED ORDER — SINCALIDE 5 MCG IJ SOLR
5 mcg | INTRAMUSCULAR | Status: DC
Start: 2015-01-09 — End: 2015-01-13

## 2015-01-09 MED FILL — LIDOCAINE (PF) 20 MG/ML (2 %) IJ SOLN: 20 mg/mL (2 %) | INTRAMUSCULAR | Qty: 5

## 2015-01-09 MED FILL — DIPRIVAN 10 MG/ML INTRAVENOUS EMULSION: 10 mg/mL | INTRAVENOUS | Qty: 20

## 2015-01-09 MED FILL — KINEVAC 5 MCG SOLUTION FOR INJECTION: 5 mcg | INTRAMUSCULAR | Qty: 5

## 2015-01-10 ENCOUNTER — Ambulatory Visit: Payer: MEDICARE | Primary: Internal Medicine

## 2015-01-17 ENCOUNTER — Ambulatory Visit: Admit: 2015-01-17 | Discharge: 2015-01-17 | Payer: MEDICARE | Attending: Surgery | Primary: Internal Medicine

## 2015-01-17 DIAGNOSIS — K811 Chronic cholecystitis: Secondary | ICD-10-CM

## 2015-01-17 NOTE — Progress Notes (Signed)
To:  Dr. Sandie Ano    From: Secret Kristensen Blanche East, MD    Thank you for sending Leslie Potter to see [place].  I enjoyed meeting her and her daughter.        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     Patient has failed conservative therapy and wishes to proceed with surgical intervention.    Comorbid hypothyroidism, CAD, GERD.  Body mass index is 28.15 kg/(m^2).  Status post tubal liagtion 1977.      Plan/Recommendations/Medical Decision Making:   Cholecystectomy with IOC.    Discussed the risk of surgery including infection, hematoma, bleeding, bile duct or bowel injury, recurrence or persistence of symptoms, conversion to an open operation, retained CBD stones, possible JP drain, and the risks of general anesthetic.  All questions were answered to the patient's satisfaction.     Perioperative management of CAD and GERD.    HPI:   Patient is a 74 y.o. female who is seen in consultation at the request of Dr. Sandie Ano for RUQ and epigastric pain.  She has known that she has gallstones but has not wanted to have surgery.  Stones have been seen on [place] and CT at another facility near home.  She recently saw Dr. Sandie Ano for a second opinion and he felt her sxs were likley due to her gallstones.  He sent her for a HIDA scan which was c/w chronic cholecystitis with a EF of only 9%.  First noted symptoms over 10 years ago.  Lately getting worse.  Dull pain and gets bloating after eating.  Has had EGD and there was obvious explanation for her sxs.      Past Medical History   Diagnosis Date   ??? Arthritis      hands/knees   ??? CAD (coronary artery disease)      high cholesterol   ??? GERD (gastroesophageal reflux disease)    ??? Ill-defined condition      diverticulitis   ??? Osteopenia    ??? Psychiatric disorder      treated for anxiety and depression   ??? Thyroid disease       Past Surgical History   Procedure Laterality Date    ??? Hx tubal ligation     ??? Hx retinal detachment repair       tears   ??? Hx other surgical       colon polyp removed   ??? Upper gi endoscopy,biopsy  01/06/2015           Social History   Substance Use Topics   ??? Smoking status: Never Smoker   ??? Smokeless tobacco: Not on file   ??? Alcohol use No      Family History   Problem Relation Age of Onset   ??? Heart Disease Mother    ??? Heart Disease Father    ??? Heart Disease Sister    ??? Colon Cancer Sister    ??? Heart Disease Brother        Current Outpatient Prescriptions   Medication Sig   ??? omeprazole (PRILOSEC) 40 mg capsule Take 1 Cap by mouth daily. Indications: EROSIVE ESOPHAGITIS   ??? buPROPion SR (WELLBUTRIN SR) 150 mg SR tablet Take 150 mg by mouth daily.   ??? pravastatin (PRAVACHOL) 20 mg tablet Take 20 mg by mouth nightly.   ??? liothyronine (CYTOMEL) 5 mcg tablet Take 5 mcg  by mouth daily.   ??? OTHER,NON-FORMULARY, three (3) times daily (with meals). pancreatic enyme formula   ??? BENEFIBER, WHEAT DEXTRIN, PO Take  by mouth daily.   ??? ascorbic acid (VITAMIN C) 1,000 mg tablet Take 1,000 mg by mouth daily. 2 daily   ??? calcium-magnesium-zinc Tab Take 1,000 mg by mouth daily.   ??? FLAXSEED OIL Take 1,000 mg by mouth two (2) times a day.   ??? ERGOCALCIFEROL (VITAMIN D PO) Take 5,000 mg by mouth two (2) times a day.   ??? levothyroxine (SYNTHROID) 100 mcg tablet Take 100 mcg by mouth daily (before breakfast).   ??? ranitidine (ZANTAC) 150 mg tablet Take 150 mg by mouth as needed for Indigestion.   ??? nystatin (MYCOSTATIN) 500,000 unit tab Take 1 Tab by mouth two (2) times a day.   ??? MULTIVITAMINS (MULTI-VITAMIN PO) Take 1 Tab by mouth daily.   ??? sumatriptan (IMITREX) 25 mg tablet Take 25 mg by mouth once as needed.     No current facility-administered medications for this visit.        Allergies:  No Known Allergies     Review of Systems:  10 systems reviewed.  See scanned sheet in "Media" section.  See HPI for pertinent positives and negatives.      Objective:     Visit Vitals    ??? BP 133/86 (BP 1 Location: Right arm, BP Patient Position: Sitting)   ??? Pulse 68   ??? Temp 98 ??F (36.7 ??C)   ??? Resp 20   ??? Ht 5\' 4"  (1.626 m)   ??? Wt 74.4 kg (164 lb)   ??? SpO2 98%   ??? BMI 28.15 kg/m2     General appearance  Alert, cooperative, no distress, appears stated age   HEENT  Anicteric   Neck Supple   Back   No CVA tenderness   Lungs   CTAB   Heart  Regular rate and rhythm.  No murmur, rub or gallop   Abdomen   Soft. Bowel sounds normal. No masses, no organomegaly.  TTP in the upper abd, epigastric>R>L.     Extremities No CCE.   Pulses 2+ right radial   Skin Skin color, texture, turgor normal.        Neurologic Without overt sensory or motor deficit     Imaging and Lab Review:   images and reports reviewed    Signed By: Ernest Mallick, MD     January 17, 2015

## 2015-01-19 ENCOUNTER — Telehealth: Payer: Self-pay | Admitting: Cardiology

## 2015-01-19 NOTE — Other (Signed)
Called [facility] to refax ekg to pre op due to unable to see clearly prior fax.

## 2015-01-19 NOTE — Other (Signed)
Called patient and left voicemail to take am of surgery with a sip of water wellbutrin and cytomel.

## 2015-01-19 NOTE — Other (Signed)
[facility]  Preoperative Instructions        Surgery Date 01/20/15          Time of Arrival 0945    1. On the day of your surgery, please report to the Surgical Services Registration Desk and sign in at your designated time. The Surgery Center is located to the right of the Emergency Room.     2. You must have someone with you to drive you home. You should not drive a car for 24 hours following surgery. Please make arrangements for a friend or family member to stay with you for the first 24 hours after your surgery.    3. Do not have anything to eat or drink (including water, gum, mints, coffee, juice) after midnight 01/19/15              . This may not apply to medications prescribed by your physician.  Please note special instructions, if applicable.  If you are currently taking Plavix, Coumadin, or other blood-thinning agents, contact your surgeon for instructions.    4. We recommend you do not drink any alcoholic beverages for 24 hours before and after your surgery.    5. Have a list of all current medications, including vitamins, herbal supplements and any other over the counter medications. Stop all Aspirin and non-steroidal anti-inflammatory drugs (I.e. Advil, Aleve), as directed by your surgeon's office. Stop all vitamins and herbal supplements seven days prior to your surgery.    6. Wear comfortable clothes.  Wear glasses instead of contacts.  Do not bring any money or jewelry. Please bring picture ID, insurance card, and any prearranged co-payment or hospital payment.  Do not wear make-up, particularly mascara the morning of your surgery.  Do not wear nail polish, particularly if you are having foot /hand surgery.  Wear your hair loose or down, no ponytails, buns, bobby pins or clips.  All body piercings must be removed.  Please shower with antibacterial soap for three consecutive days before and on the morning of surgery, but do not  apply any lotions, powders or deodorants after the shower on the day of surgery. Please use a fresh towels after each shower. Please sleep in clean clothes and change bed linens the night before surgery.  Please do not shave for 48 hours prior to surgery. Shaving of the face is acceptable.    7. You should understand that if you do not follow these instructions your surgery may be cancelled.  If your physical condition changes (I.e. fever, cold or flu) please contact your surgeon as soon as possible.    8. It is important that you be on time.  If a situation occurs where you may be late, please call [phone] (OR Holding Area).    9. If you have any questions and or problems, please call [phone] (Pre-admission Testing).    10. Your surgery time may be subject to change.  You will receive a phone call the evening prior if your time changes.    11.  If having outpatient surgery, you must have someone to drive you here, stay with you during the duration of your stay, and to drive you home at time of discharge.        Special Instructions:    MEDICATIONS TO TAKE THE MORNING OF SURGERY WITH A SIP OF WATER:prilosec, synthroid      I understand a pre-operative phone call will be made to verify my surgery time.  In  the event that I am not available, I give permission for a message to be left on my answering service and/or with another person?  Yes @ [phone]         ___________________      __________   _________    (Signature of Patient)             (Witness)                (Date and Time)

## 2015-01-19 NOTE — Telephone Encounter (Signed)
EKG re-faxed to [phone], attention: Angie.

## 2015-01-19 NOTE — Telephone Encounter (Signed)
pls re fax Angie from [facility] testing the EKG strip for this pt, they received the pt's records, but the strip was to dark to read

## 2015-01-19 NOTE — Telephone Encounter (Signed)
Please clarify where [facility] is.

## 2015-01-20 ENCOUNTER — Ambulatory Visit: Admit: 2015-01-20 | Payer: MEDICARE | Primary: Internal Medicine

## 2015-01-20 ENCOUNTER — Inpatient Hospital Stay: Payer: MEDICARE

## 2015-01-20 MED ORDER — GLYCOPYRROLATE 0.2 MG/ML IJ SOLN
0.2 mg/mL | INTRAMUSCULAR | Status: AC
Start: 2015-01-20 — End: ?

## 2015-01-20 MED ORDER — LIDOCAINE (PF) 20 MG/ML (2 %) IJ SOLN
20 mg/mL (2 %) | INTRAMUSCULAR | Status: AC
Start: 2015-01-20 — End: ?

## 2015-01-20 MED ORDER — BUPIVACAINE-EPINEPHRINE (PF) 0.5 %-1:200,000 IJ SOLN
0.5 %-1:200,000 | Freq: Once | INTRAMUSCULAR | Status: AC
Start: 2015-01-20 — End: 2015-01-20
  Administered 2015-01-20: 20:00:00 via EPIDURAL

## 2015-01-20 MED ORDER — HYDROCODONE-ACETAMINOPHEN 5 MG-325 MG TAB
5-325 mg | ORAL_TABLET | ORAL | 0 refills | Status: DC | PRN
Start: 2015-01-20 — End: 2015-02-20

## 2015-01-20 MED ORDER — ONDANSETRON (PF) 4 MG/2 ML INJECTION
4 mg/2 mL | INTRAMUSCULAR | Status: DC | PRN
Start: 2015-01-20 — End: 2015-01-20

## 2015-01-20 MED ORDER — MEPERIDINE (PF) 25 MG/ML INJ SOLUTION
25 mg/ml | INTRAMUSCULAR | Status: DC | PRN
Start: 2015-01-20 — End: 2015-01-20

## 2015-01-20 MED ORDER — ROCURONIUM 10 MG/ML IV
10 mg/mL | INTRAVENOUS | Status: AC
Start: 2015-01-20 — End: ?

## 2015-01-20 MED ORDER — DOCUSATE SODIUM 100 MG CAP
100 mg | ORAL_CAPSULE | Freq: Two times a day (BID) | ORAL | 0 refills | Status: DC
Start: 2015-01-20 — End: 2017-09-30

## 2015-01-20 MED ORDER — HYDROMORPHONE (PF) 1 MG/ML IJ SOLN
1 mg/mL | INTRAMUSCULAR | Status: DC | PRN
Start: 2015-01-20 — End: 2015-01-20

## 2015-01-20 MED ORDER — ONDANSETRON (PF) 4 MG/2 ML INJECTION
4 mg/2 mL | INTRAMUSCULAR | Status: DC | PRN
Start: 2015-01-20 — End: 2015-01-20
  Administered 2015-01-20: 19:00:00 via INTRAVENOUS

## 2015-01-20 MED ORDER — NEOSTIGMINE METHYLSULFATE 1 MG/ML INJECTION
1 mg/mL | INTRAMUSCULAR | Status: DC | PRN
Start: 2015-01-20 — End: 2015-01-20
  Administered 2015-01-20: 20:00:00 via INTRAVENOUS

## 2015-01-20 MED ORDER — DEXAMETHASONE SODIUM PHOSPHATE 4 MG/ML IJ SOLN
4 mg/mL | INTRAMUSCULAR | Status: AC
Start: 2015-01-20 — End: ?

## 2015-01-20 MED ORDER — HYDROCODONE-ACETAMINOPHEN 5 MG-325 MG TAB
5-325 mg | ORAL | Status: DC | PRN
Start: 2015-01-20 — End: 2015-01-20

## 2015-01-20 MED ORDER — LIDOCAINE (PF) 10 MG/ML (1 %) IJ SOLN
10 mg/mL (1 %) | INTRAMUSCULAR | Status: DC | PRN
Start: 2015-01-20 — End: 2015-01-20

## 2015-01-20 MED ORDER — SUCCINYLCHOLINE CHLORIDE 20 MG/ML INJECTION
20 mg/mL | INTRAMUSCULAR | Status: AC
Start: 2015-01-20 — End: ?

## 2015-01-20 MED ORDER — ACETAMINOPHEN 1,000 MG/100 ML (10 MG/ML) IV
1000 mg/100 mL (10 mg/mL) | INTRAVENOUS | Status: AC
Start: 2015-01-20 — End: ?

## 2015-01-20 MED ORDER — CEFAZOLIN 2 G IN 100 ML 0.9% NS
2 gram/100 mL | Freq: Once | INTRAVENOUS | Status: AC
Start: 2015-01-20 — End: 2015-01-20
  Administered 2015-01-20: 19:00:00 via INTRAVENOUS

## 2015-01-20 MED ORDER — MIDAZOLAM 1 MG/ML IJ SOLN
1 mg/mL | INTRAMUSCULAR | Status: AC
Start: 2015-01-20 — End: ?

## 2015-01-20 MED ORDER — SUCCINYLCHOLINE CHLORIDE 20 MG/ML INJECTION
20 mg/mL | INTRAMUSCULAR | Status: DC | PRN
Start: 2015-01-20 — End: 2015-01-20
  Administered 2015-01-20: 19:00:00 via INTRAVENOUS

## 2015-01-20 MED ORDER — PHENYLEPHRINE IN 0.9 % SODIUM CL (40 MCG/ML) IV SYRINGE
0.4 mg/10 mL (40 mcg/mL) | INTRAVENOUS | Status: DC | PRN
Start: 2015-01-20 — End: 2015-01-20
  Administered 2015-01-20: 19:00:00 via INTRAVENOUS

## 2015-01-20 MED ORDER — NEOSTIGMINE METHYLSULFATE 5 MG/5 ML (1 MG/ML) IV SYRINGE
5 mg/ mL (1 mg/mL) | INTRAVENOUS | Status: AC
Start: 2015-01-20 — End: ?

## 2015-01-20 MED ORDER — FENTANYL CITRATE (PF) 50 MCG/ML IJ SOLN
50 mcg/mL | INTRAMUSCULAR | Status: DC | PRN
Start: 2015-01-20 — End: 2015-01-20
  Administered 2015-01-20 (×3): via INTRAVENOUS

## 2015-01-20 MED ORDER — FENTANYL CITRATE (PF) 50 MCG/ML IJ SOLN
50 mcg/mL | INTRAMUSCULAR | Status: DC | PRN
Start: 2015-01-20 — End: 2015-01-20

## 2015-01-20 MED ORDER — MIDAZOLAM 1 MG/ML IJ SOLN
1 mg/mL | INTRAMUSCULAR | Status: DC | PRN
Start: 2015-01-20 — End: 2015-01-20

## 2015-01-20 MED ORDER — LACTATED RINGERS IV
INTRAVENOUS | Status: DC
Start: 2015-01-20 — End: 2015-01-20
  Administered 2015-01-20 (×2): via INTRAVENOUS

## 2015-01-20 MED ORDER — IOTHALAMATE MEGLUMINE 60 % INJECTION
60 % | Freq: Once | INTRAMUSCULAR | Status: AC
Start: 2015-01-20 — End: 2015-01-20
  Administered 2015-01-20: 20:00:00

## 2015-01-20 MED ORDER — HYDROMORPHONE (PF) 2 MG/ML IJ SOLN
2 mg/mL | INTRAMUSCULAR | Status: DC | PRN
Start: 2015-01-20 — End: 2015-01-20
  Administered 2015-01-20 (×2): via INTRAVENOUS

## 2015-01-20 MED ORDER — IBUPROFEN 600 MG TAB
600 mg | ORAL_TABLET | Freq: Three times a day (TID) | ORAL | 0 refills | Status: DC
Start: 2015-01-20 — End: 2015-05-09

## 2015-01-20 MED ORDER — HYDROMORPHONE (PF) 2 MG/ML IJ SOLN
2 mg/mL | INTRAMUSCULAR | Status: AC
Start: 2015-01-20 — End: ?

## 2015-01-20 MED ORDER — PROPOFOL 10 MG/ML IV EMUL
10 mg/mL | INTRAVENOUS | Status: AC
Start: 2015-01-20 — End: ?

## 2015-01-20 MED ORDER — ONDANSETRON (PF) 4 MG/2 ML INJECTION
4 mg/2 mL | INTRAMUSCULAR | Status: AC
Start: 2015-01-20 — End: ?

## 2015-01-20 MED ORDER — FENTANYL CITRATE (PF) 50 MCG/ML IJ SOLN
50 mcg/mL | INTRAMUSCULAR | Status: AC
Start: 2015-01-20 — End: ?

## 2015-01-20 MED ORDER — DEXAMETHASONE SODIUM PHOSPHATE 4 MG/ML IJ SOLN
4 mg/mL | INTRAMUSCULAR | Status: DC | PRN
Start: 2015-01-20 — End: 2015-01-20
  Administered 2015-01-20: 19:00:00 via INTRAVENOUS

## 2015-01-20 MED ORDER — LIDOCAINE (PF) 20 MG/ML (2 %) IJ SOLN
20 mg/mL (2 %) | INTRAMUSCULAR | Status: DC | PRN
Start: 2015-01-20 — End: 2015-01-20
  Administered 2015-01-20: 19:00:00 via INTRAVENOUS

## 2015-01-20 MED ORDER — ROCURONIUM 10 MG/ML IV
10 mg/mL | INTRAVENOUS | Status: DC | PRN
Start: 2015-01-20 — End: 2015-01-20
  Administered 2015-01-20 (×2): via INTRAVENOUS

## 2015-01-20 MED ORDER — GLYCOPYRROLATE 0.2 MG/ML IJ SOLN
0.2 mg/mL | INTRAMUSCULAR | Status: DC | PRN
Start: 2015-01-20 — End: 2015-01-20
  Administered 2015-01-20: 20:00:00 via INTRAVENOUS

## 2015-01-20 MED ORDER — MIDAZOLAM 1 MG/ML IJ SOLN
1 mg/mL | INTRAMUSCULAR | Status: DC | PRN
Start: 2015-01-20 — End: 2015-01-20
  Administered 2015-01-20: 19:00:00 via INTRAVENOUS

## 2015-01-20 MED ORDER — DIPHENHYDRAMINE HCL 50 MG/ML IJ SOLN
50 mg/mL | INTRAMUSCULAR | Status: DC | PRN
Start: 2015-01-20 — End: 2015-01-20

## 2015-01-20 MED ORDER — LACTATED RINGERS IV
INTRAVENOUS | Status: DC
Start: 2015-01-20 — End: 2015-01-20

## 2015-01-20 MED ORDER — PROPOFOL 10 MG/ML IV EMUL
10 mg/mL | INTRAVENOUS | Status: DC | PRN
Start: 2015-01-20 — End: 2015-01-20
  Administered 2015-01-20: 19:00:00 via INTRAVENOUS

## 2015-01-20 MED ORDER — ACETAMINOPHEN 1,000 MG/100 ML (10 MG/ML) IV
1000 mg/100 mL (10 mg/mL) | INTRAVENOUS | Status: DC | PRN
Start: 2015-01-20 — End: 2015-01-20
  Administered 2015-01-20: 19:00:00 via INTRAVENOUS

## 2015-01-20 MED FILL — NEOSTIGMINE METHYLSULFATE 5 MG/5 ML (1 MG/ML) IV SYRINGE: 5 mg/ mL (1 mg/mL) | INTRAVENOUS | Qty: 5

## 2015-01-20 MED FILL — OFIRMEV 1,000 MG/100 ML (10 MG/ML) INTRAVENOUS SOLUTION: 1000 mg/100 mL (10 mg/mL) | INTRAVENOUS | Qty: 100

## 2015-01-20 MED FILL — LACTATED RINGERS IV: INTRAVENOUS | Qty: 1000

## 2015-01-20 MED FILL — DEXAMETHASONE SODIUM PHOSPHATE 4 MG/ML IJ SOLN: 4 mg/mL | INTRAMUSCULAR | Qty: 5

## 2015-01-20 MED FILL — HYDROMORPHONE (PF) 2 MG/ML IJ SOLN: 2 mg/mL | INTRAMUSCULAR | Qty: 1

## 2015-01-20 MED FILL — ROCURONIUM 10 MG/ML IV: 10 mg/mL | INTRAVENOUS | Qty: 5

## 2015-01-20 MED FILL — LIDOCAINE (PF) 20 MG/ML (2 %) IJ SOLN: 20 mg/mL (2 %) | INTRAMUSCULAR | Qty: 5

## 2015-01-20 MED FILL — PROPOFOL 10 MG/ML IV EMUL: 10 mg/mL | INTRAVENOUS | Qty: 20

## 2015-01-20 MED FILL — GLYCOPYRROLATE 0.2 MG/ML IJ SOLN: 0.2 mg/mL | INTRAMUSCULAR | Qty: 2

## 2015-01-20 MED FILL — QUELICIN 20 MG/ML INJECTION SOLUTION: 20 mg/mL | INTRAMUSCULAR | Qty: 10

## 2015-01-20 MED FILL — MIDAZOLAM 1 MG/ML IJ SOLN: 1 mg/mL | INTRAMUSCULAR | Qty: 2

## 2015-01-20 MED FILL — ONDANSETRON (PF) 4 MG/2 ML INJECTION: 4 mg/2 mL | INTRAMUSCULAR | Qty: 2

## 2015-01-20 MED FILL — FENTANYL CITRATE (PF) 50 MCG/ML IJ SOLN: 50 mcg/mL | INTRAMUSCULAR | Qty: 2

## 2015-01-20 MED FILL — CEFAZOLIN 2 G IN 100 ML 0.9% NS: 2 gram/100 mL | INTRAVENOUS | Qty: 100

## 2015-01-20 NOTE — Other (Signed)
Handoff Report from Operating Room to PACU    Report received from R. Donley Redder, RN and [employer], CRNA regarding Leslie Potter      Surgeon(s):  Ernest Mallick, MD  And Procedure(s) (LRB):  LAPAROSCOPIC CHOLECYSTECTOMY WITH INTRAOPERATIVE CHOLANGIOGRAM (N/A)  confirmed   with allergies and dressings discussed.    Anesthesia type, drugs, patient history, complications, estimated blood loss, vital signs, intake and output, and last pain medication, lines, reversal medications and temperature were reviewed.

## 2015-01-20 NOTE — Anesthesia Pre-Procedure Evaluation (Signed)
Anesthetic History   No history of anesthetic complications            Review of Systems / Medical History  Patient summary reviewed, nursing notes reviewed and pertinent labs reviewed    Pulmonary  Within defined limits                 Neuro/Psych   Within defined limits      Psychiatric history     Cardiovascular  Within defined limits            CAD    Exercise tolerance: >4 METS     GI/Hepatic/Renal  Within defined limits              Endo/Other  Within defined limits           Other Findings              Physical Exam    Airway  Mallampati: II  TM Distance: 4 - 6 cm  Neck ROM: normal range of motion   Mouth opening: Normal     Cardiovascular  Regular rate and rhythm,  S1 and S2 normal,  no murmur, click, rub, or gallop             Dental  No notable dental hx  Dentition: Bridges and Implants     Pulmonary  Breath sounds clear to auscultation               Abdominal  GI exam deferred       Other Findings            Anesthetic Plan    ASA: 2  Anesthesia type: general    Monitoring Plan: BIS      Induction: Intravenous  Anesthetic plan and risks discussed with: Patient

## 2015-01-20 NOTE — Op Note (Signed)
DATE OF PROCEDURE:  01/20/2015     PREOPERATIVE DIAGNOSIS: ?? Biliary dyskinesia    POSTOPERATIVE DIAGNOSIS: ?? Same    OPERATIVE PROCEDURE: ??Laparoscopic cholecystectomy with cholangiogram, interpretation of cholangiogram (CPT [phone])    SURGEON: ??Ernest Mallick, MD    ANESTHESIA: ??General endotracheal.    EBL: minimal    SPECIMENS:   ID Type Source Tests Collected by Time Destination   1 : Gallbladder Preservative Gallbladder  Ernest Mallick, MD 01/20/2015 1513 Pathology        FINDINGS:  Chronic inflammation of the gallbladder, gallstones.  No CBD filling defect on cholangiogram.    INDICATIONS:  RUQ and epigastric pain.  Abnormal gallbladder EF on HIDA with exact reproduction of post-prandial pain with CCK administration.    DESCRIPTION OF PROCEDURE: ??After obtaining informed consent, the patient was taken to the operating room and placed supine on the operating table.  An operative time-out was performed, and general endotracheal anesthesia was induced. ??Preoperative antibiotics were administered, and the abdomen was prepped and draped in the usual sterile fashion. ??The abdomen was then entered just above the umbilicus using a 5-mm optical trocar. ??The abdomen was the insufflated without incident and was visually explored. ??There was no other pathology noted. ??Two right upper quadrant 5-mm ports were placed under direct vision, followed by a 12-mm port in the subxiphoid position. ??30 mL of Marcaine 0.5% with epinephrine was infiltrated at the port sites prior to placement.    The gallbladder fundus was then grasped and retracted cephalad over the liver. ??The triangle of Calot was exposed, and the cystic duct and artery were skeletonized using a combination of blunt dissection with a Art gallery manager and hook electrocautery. ??The cystic artery was then divided between clips with 2 clips left on the patient side.  The cystic duct was then traced from the gallbladder infundibulum into its insertion into  the portal triad. ??The cystic duct was swept with a grasper up into the gallbladder.  A clip was placed on the gallbladder infundibulum and an incision in the cystic duct adjacent to the gallbladder infundibulum was made with shears and the cholangiogram catheter was inserted and the cholangiogram was per performed with the above noted findings.  The cystic duct was then divided between clips directly adjacent to the gallbladder infundibulum, with 3 clip(s) left on the patient's side.  The gallbladder was then removed from the liver bed using hook electrocautery. ??It was removed from the abdominal cavity using a bag through the subxiphoid incision. ??The liver bed was inspected for hemostasis, and excellent hemostasis was achieved with minimal electrocautery. ??The clips on the cystic duct and artery were again visualized and were intact. ??The area below and lateral to the liver was suction irrigated until clear.      The right upper quadrant ports were removed under direct vision and both were hemostatic. ??The abdomen was then desufflated through the subxiphoid port under direct vision via the umbilical port. ??These ports were subsequently removed and the fascial defect at the 12-mm incision was closed with an interrupted 0 Vicryl suture. ??The incisions were irrigated with sterile saline and made hemostatic with minimal electrocautery. ??They were closed with buried interrupted 4-0 Monocryl sutures, and dressed with sterile dressing. ??The patient was recovered from general anesthesia and taken to the recovery area in satisfactory condition. ?? All instrument, sponge, and needle counts were reported as correct.    Franklyn Cafaro Blanche East, MD

## 2015-01-20 NOTE — H&P (Signed)
No interval change.  Pernella Ackerley J Deontrey Massi, MD

## 2015-01-20 NOTE — Anesthesia Post-Procedure Evaluation (Signed)
Post-Anesthesia Evaluation and Assessment    Patient: Leslie Potter MRN: [id number]  SSN: [id number]    Date of Birth: 09-24-1940  Age: 74 y.o.  Sex: female       Cardiovascular Function/Vital Signs  Visit Vitals   ??? BP 101/45   ??? Pulse 65   ??? Temp 36.6 ??C (97.9 ??F)   ??? Resp 14   ??? Ht  (1.626 m)   ??? Wt 74.5 kg (164 lb 4 oz)   ??? SpO2 94%   ??? BMI 28.19 kg/m2       Patient is status post general anesthesia for Procedure(s):  LAPAROSCOPIC CHOLECYSTECTOMY WITH INTRAOPERATIVE CHOLANGIOGRAM.    Nausea/Vomiting: None    Postoperative hydration reviewed and adequate.    Pain:  Pain Scale 1: Numeric (0 - 10) (01/20/15 1549)  Pain Intensity 1: 0 (01/20/15 1549)   Managed    Neurological Status:   Neuro (WDL): Exceptions to WDL (01/20/15 1549)  Neuro  Neurologic State: Appropriate for age;Drowsy;Eyes open spontaneously;Eyes open to voice (01/20/15 1549)  Orientation Level: Oriented to person;Oriented to place;Oriented to situation (01/20/15 1549)  Cognition: Follows commands (01/20/15 1549)  LUE Motor Response: Purposeful (01/20/15 1549)  LLE Motor Response: Purposeful (01/20/15 1549)  RUE Motor Response: Purposeful (01/20/15 1549)  RLE Motor Response: Purposeful (01/20/15 1549)   At baseline    Mental Status and Level of Consciousness: Arousable    Pulmonary Status:   O2 Device: Nasal cannula (01/20/15 1552)   Adequate oxygenation and airway patent    Complications related to anesthesia: None    Post-anesthesia assessment completed. No concerns    Signed By: Atilano Ina, MD     January 20, 2015

## 2015-01-20 NOTE — Op Note (Signed)
DATE OF PROCEDURE:  01/20/2015     PREOPERATIVE DIAGNOSIS: ?? Biliary dyskinesia    POSTOPERATIVE DIAGNOSIS: ?? Same    OPERATIVE PROCEDURE: ??Laparoscopic cholecystectomy with cholangiogram, interpretation of cholangiogram (CPT [phone])    SURGEON: ??Ernest Mallick, MD    ANESTHESIA: ??General endotracheal.    EBL: minimal    SPECIMENS:   ID Type Source Tests Collected by Time Destination   1 : Gallbladder Preservative Gallbladder  Ernest Mallick, MD 01/20/2015 1513 Pathology        FINDINGS:  Chronic inflammation of the gallbladder, gallstones.  No CBD filling defect on cholangiogram.    INDICATIONS:  RUQ and epigastric pain.  Abnormal gallbladder EF on HIDA with exact reproduction of post-prandial pain with CCK administration.    DESCRIPTION OF PROCEDURE: ??After obtaining informed consent, the patient was taken to the operating room and placed supine on the operating table.  An operative time-out was performed, and general endotracheal anesthesia was induced. ??Preoperative antibiotics were administered, and the abdomen was prepped and draped in the usual sterile fashion. ??The abdomen was then entered just above the umbilicus using a 5-mm optical trocar. ??The abdomen was the insufflated without incident and was visually explored. ??There was no other pathology noted. ??Two right upper quadrant 5-mm ports were placed under direct vision, followed by a 12-mm port in the subxiphoid position. ??30 mL of Marcaine 0.5% with epinephrine was infiltrated at the port sites prior to placement.    The gallbladder fundus was then grasped and retracted cephalad over the liver. ??The triangle of Calot was exposed, and the cystic duct and artery were skeletonized using a combination of blunt dissection with a Art gallery manager and hook electrocautery. ??The cystic artery was then divided between clips with 2 clips left on the patient side.  The cystic duct was  then traced from the gallbladder infundibulum into its insertion into the portal triad. ??The cystic duct was swept with a grasper up into the gallbladder.  A clip was placed on the gallbladder infundibulum and an incision in the cystic duct adjacent to the gallbladder infundibulum was made with shears and the cholangiogram catheter was inserted and the cholangiogram was per performed with the above noted findings.  The cystic duct was then divided between clips directly adjacent to the gallbladder infundibulum, with 3 clip(s) left on the patient's side.  The gallbladder was then removed from the liver bed using hook electrocautery. ??It was removed from the abdominal cavity using a bag through the subxiphoid incision. ??The liver bed was inspected for hemostasis, and excellent hemostasis was achieved with minimal electrocautery. ??The clips on the cystic duct and artery were again visualized and were intact. ??The area below and lateral to the liver was suction irrigated until clear.      The right upper quadrant ports were removed under direct vision and both were hemostatic. ??The abdomen was then desufflated through the subxiphoid port under direct vision via the umbilical port. ??These ports were subsequently removed and the fascial defect at the 12-mm incision was closed with an interrupted 0 Vicryl suture. ??The incisions were irrigated with sterile saline and made hemostatic with minimal electrocautery. ??They were closed with buried interrupted 4-0 Monocryl sutures, and dressed with sterile dressing. ??The patient was recovered from general anesthesia and taken to the recovery area in satisfactory condition. ?? All instrument, sponge, and needle counts were reported as correct.    Alekai Pocock Blanche East, MD

## 2015-01-24 MED FILL — LIDOCAINE (PF) 20 MG/ML (2 %) IJ SOLN: 20 mg/mL (2 %) | INTRAMUSCULAR | Qty: 40

## 2015-01-24 MED FILL — ONDANSETRON (PF) 4 MG/2 ML INJECTION: 4 mg/2 mL | INTRAMUSCULAR | Qty: 4

## 2015-01-24 MED FILL — SENSORCAINE-MPF/EPINEPHRINE 0.5 %-1:200,000 INJECTION SOLUTION: 0.5 %-1:200,000 | INTRAMUSCULAR | Qty: 30

## 2015-01-24 MED FILL — ROCURONIUM 10 MG/ML IV: 10 mg/mL | INTRAVENOUS | Qty: 25

## 2015-01-24 MED FILL — QUELICIN 20 MG/ML INJECTION SOLUTION: 20 mg/mL | INTRAMUSCULAR | Qty: 100

## 2015-01-24 MED FILL — OFIRMEV 1,000 MG/100 ML (10 MG/ML) INTRAVENOUS SOLUTION: 1000 mg/100 mL (10 mg/mL) | INTRAVENOUS | Qty: 1000

## 2015-01-24 MED FILL — DIPRIVAN 10 MG/ML INTRAVENOUS EMULSION: 10 mg/mL | INTRAVENOUS | Qty: 130

## 2015-01-24 MED FILL — GLYCOPYRROLATE 0.2 MG/ML IJ SOLN: 0.2 mg/mL | INTRAMUSCULAR | Qty: 0.4

## 2015-01-24 MED FILL — LACTATED RINGERS IV: INTRAVENOUS | Qty: 1000

## 2015-01-24 MED FILL — DEXAMETHASONE SODIUM PHOSPHATE 4 MG/ML IJ SOLN: 4 mg/mL | INTRAMUSCULAR | Qty: 8

## 2015-01-24 MED FILL — PHENYLEPHRINE IN 0.9 % SODIUM CL (40 MCG/ML) IV SYRINGE: 0.4 mg/10 mL (40 mcg/mL) | INTRAVENOUS | Qty: 40

## 2015-01-24 MED FILL — NEOSTIGMINE METHYLSULFATE 1 MG/ML INJECTION: 1 mg/mL | INTRAMUSCULAR | Qty: 2

## 2015-01-24 MED FILL — CONRAY 60 % INJECTION SOLUTION: 60 % | INTRAMUSCULAR | Qty: 50

## 2015-02-20 ENCOUNTER — Ambulatory Visit: Admit: 2015-02-20 | Discharge: 2015-02-20 | Payer: MEDICARE | Attending: Surgery | Primary: Internal Medicine

## 2015-02-20 DIAGNOSIS — Z09 Encounter for follow-up examination after completed treatment for conditions other than malignant neoplasm: Secondary | ICD-10-CM

## 2015-02-20 NOTE — Progress Notes (Signed)
To: Dr. Sandie Ano  From: Ernest Mallick, MD    Thank you for referring Leslie Potter.     Encounter Date: 02/20/2015    Subjective:      Leslie Potter is a 74 y.o. female presents for postop care.    Has no complaints today. Feels great.  Preoperative symptoms are entirely resolved.    Eating a regular diet without difficulty.   Bowel movements are regular.    Objective:     General:  alert, cooperative, no distress, appears stated age   Abdomen: soft, bowel sounds active, non-tender   Incision(s):   healing well, no drainage, no erythema, no hernia, no seroma, no swelling, no dehiscence, incision well approximated.     Assessment:     S/p laparoscopic cholecystectomy.  Pathology revealed sludge and chronic cholecystitis.  No CBD filling defects on intraoperative cholangiogram.   Doing well postoperatively.    Plan:     Patient understands no further follow-up required.    Knows to call for any concerns.      Ernest Mallick, MD

## 2015-03-17 ENCOUNTER — Inpatient Hospital Stay
Admit: 2015-03-17 | Discharge: 2015-03-17 | Disposition: A | Discharge status: Home / Self Care | Payer: MEDICARE | Attending: Emergency Medicine

## 2015-03-17 ENCOUNTER — Emergency Department: Admit: 2015-03-17 | Payer: MEDICARE | Primary: Internal Medicine

## 2015-03-17 DIAGNOSIS — J209 Acute bronchitis, unspecified: Secondary | ICD-10-CM

## 2015-03-17 MED ORDER — IPRATROPIUM-ALBUTEROL 2.5 MG-0.5 MG/3 ML NEB SOLUTION
2.5 mg-0.5 mg/3 ml | RESPIRATORY_TRACT | Status: AC
Start: 2015-03-17 — End: 2015-03-17
  Administered 2015-03-17: 15:00:00 via RESPIRATORY_TRACT

## 2015-03-17 MED ORDER — HYDROCODONE 10 MG-CHLORPHENIRAMINE 8 MG/5 ML ORAL SUSP EXTEND.REL 12HR
10-8 mg/5 mL | Freq: Two times a day (BID) | ORAL | 0 refills | Status: DC | PRN
Start: 2015-03-17 — End: 2015-05-09

## 2015-03-17 MED ORDER — AZITHROMYCIN 250 MG TAB
250 mg | ORAL_TABLET | ORAL | 0 refills | Status: DC
Start: 2015-03-17 — End: 2015-05-09

## 2015-03-17 MED ORDER — METHYLPREDNISOLONE 4 MG TABS IN A DOSE PACK
4 mg | ORAL | 0 refills | Status: DC
Start: 2015-03-17 — End: 2015-05-09

## 2015-03-17 MED ORDER — ALBUTEROL SULFATE HFA 90 MCG/ACTUATION AEROSOL INHALER
90 mcg/actuation | RESPIRATORY_TRACT | 0 refills | Status: DC | PRN
Start: 2015-03-17 — End: 2015-06-19

## 2015-03-17 MED FILL — IPRATROPIUM-ALBUTEROL 2.5 MG-0.5 MG/3 ML NEB SOLUTION: 2.5 mg-0.5 mg/3 ml | RESPIRATORY_TRACT | Qty: 3

## 2015-03-17 NOTE — ED Triage Notes (Signed)
Pt ambulated back to the treatment area with a steady gait. Pt states "I have had a dry cough for a week. Chest feels congested." Pt denies fever.

## 2015-03-17 NOTE — ED Provider Notes (Signed)
HPI Comments: Pt. Presents to the ER with cough. She has had a cough for several weeks that is worsening in severity.  Pt. Reports that her cough was initially non-productive, but it has since become productive.  She is coughing up clear mucous.  It is worse at night and her cough seems to come in fits.  No fevers/chills.  Pt. Reports chest soreness with coughing and a sore throat with coughing.  No SOB.  No other CP.  Pt. Denies sick contacts or similar sx in the past.      Patient is a 74 y.o. female presenting with cough.   Cough   Associated symptoms include sore throat. Pertinent negatives include no chest pain, no chills, no rhinorrhea, no shortness of breath, no nausea and no vomiting.        Past Medical History:   Diagnosis Date   ??? Arthritis      hands/knees   ??? CAD (coronary artery disease)      high cholesterol   ??? GERD (gastroesophageal reflux disease)    ??? Ill-defined condition      diverticulitis   ??? Osteopenia    ??? Psychiatric disorder      treated for anxiety and depression   ??? Thyroid disease        Past Surgical History:   Procedure Laterality Date   ??? Hx tubal ligation     ??? Hx other surgical       colon polyp removed   ??? Upper gi endoscopy,biopsy  01/06/2015         ??? Hx retinal detachment repair Right 2008     tears   ??? Hx cataract removal Bilateral 2010         Family History:   Problem Relation Age of Onset   ??? Heart Disease Mother    ??? Heart Disease Father    ??? Heart Disease Sister    ??? Colon Cancer Sister    ??? Heart Disease Brother        Social History     Social History   ??? Marital status: DIVORCED     Spouse name: N/A   ??? Number of children: N/A   ??? Years of education: N/A     Occupational History   ??? Not on file.     Social History Main Topics   ??? Smoking status: Never Smoker   ??? Smokeless tobacco: Not on file   ??? Alcohol use No   ??? Drug use: No   ??? Sexual activity: Not on file     Other Topics Concern   ??? Not on file     Social History Narrative          ALLERGIES: Review of patient's allergies indicates no known allergies.    Review of Systems   Constitutional: Negative for chills and fever.   HENT: Positive for sore throat. Negative for rhinorrhea.    Respiratory: Positive for cough. Negative for shortness of breath.    Cardiovascular: Negative for chest pain.   Gastrointestinal: Negative for abdominal pain, diarrhea, nausea and vomiting.   Genitourinary: Negative for dysuria and urgency.   Musculoskeletal: Negative for arthralgias and back pain.   Skin: Negative for rash.   Neurological: Negative for dizziness, weakness and light-headedness.       Vitals:    03/17/15 1115 03/17/15 1129 03/17/15 1130 03/17/15 1145   BP: 134/76  140/72 121/87   Pulse:       Resp:  Temp:       SpO2: 96% 98% 98% 94%   Weight:       Height:                Physical Exam     Const:  No acute distress, well developed, well nourished  Head:  Atraumatic, normocephalic  Eyes:  PERRL, conjunctiva normal, no scleral icterus  Neck:  Supple, trachea midline  Cardiovascular:  RRR, no murmurs, no gallops, no rubs  Resp:  No resp distress, no increased work of breathing, no wheezes, no rhonchi, no rales,  Abd:  Soft, non-tender, non-distended, no rebound, no guarding, no CVA tenderness  GU:  Deferred  MSK:  No pedal edema, normal ROM  Neuro:  Alert and oriented x3, no cranial nerve defect  Skin:  Warm, dry, intact  Psych: normal mood and affect, behavior is normal, judgement and thought content is normal        MDM  Number of Diagnoses or Management Options  Acute bronchitis, unspecified organism:      Amount and/or Complexity of Data Reviewed  Clinical lab tests: ordered and reviewed  Tests in the radiology section of CPT??: ordered and reviewed  Review and summarize past medical records: yes    Patient Progress  Patient progress: stable    ED Course     Pt. Presents to the ER with cough.  Her sx are consistent with bronchitis.   No pneumonia on xray.  I will start her on a zpak, steroids, tussionex, and albuterol.  Pt. To f/u with her PCP or return to the ER with worsening sx.      Procedures

## 2015-03-17 NOTE — ED Notes (Signed)
Pt was discharged and given instructions by Dr Leary . Pt verbalized good understanding of all discharge instructions,prescriptions and F/U care. All questions answered. Pt in stable condition on discharge.

## 2015-05-05 ENCOUNTER — Encounter: Attending: Internal Medicine | Primary: Internal Medicine

## 2015-05-08 ENCOUNTER — Inpatient Hospital Stay: Admit: 2015-07-05 | Payer: MEDICARE | Primary: Internal Medicine

## 2015-05-08 ENCOUNTER — Ambulatory Visit: Admit: 2015-05-08 | Discharge: 2015-05-08 | Payer: MEDICARE | Attending: Internal Medicine | Primary: Internal Medicine

## 2015-05-08 DIAGNOSIS — E039 Hypothyroidism, unspecified: Secondary | ICD-10-CM

## 2015-05-08 NOTE — Patient Instructions (Signed)
PRESCRIPTION REFILL POLICY  Effective 08/19/2012    In an effort to ensure that the large volume of prescription refills are processed in the most efficient and expeditious manner, we are asking our patients to assist us by calling your pharmacy for all prescription refills. This will include Mail Order Pharmacies. The pharmacy will contact our office electronically to continue the refill process.     Please do not wait until the last minute to call your pharmacy. We require 72 hours (3 days) to fill prescriptions. We also encourage you to call your pharmacy before picking up your prescription to make sure it is ready.    With regard to controlled substance refill request (narcotics and many ADD/ADHD treatment prescriptions) and any other prescriptions that need to be picked up at our office, we ask your cooperation by providing us with at least 72 hours (3 days) notice prior to your need of the prescription. You will need to show a valid ID when picking up your prescription. Anyone delegated by you to pick up your prescriptions must be listed be listed on you HIPPA authorization form, and show a valid ID.    Narcotics will not be refilled on a weekend or on a holiday in which the office is closed.    We also encourage an alternative method of refill requests, which is through our medically secure web portal, MyChart. This is an efficient and effective way to communicate your requests directly with the office and provider. MyChart can be downloaded onto your smartphone as an App. If you are ready to be connected, please review the attached instructions or speak to any of our staff to get you set up right away!    Thank you so much for your cooperation. Should you have any questions, please contact our Practice Administrator, Joan Chappell at [phone]

## 2015-05-08 NOTE — Progress Notes (Signed)
New Patient       HPI:  Leslie Potter is a 74 y.o. year old female who is here to establish care. She  [place] in [place] with an Integr[facility] Medicine doctor who didn't take her insurance.  Ordered many labs which she brought.  Here with her daughter who is a Publishing rights managernurse practitioner and lives in [place].      She reports the following history and medical concerns:      [place] [facility]    Dr. Sandie Anouckworth- EGD (esophagitis) on prilosec 40 mg.  Epigastric pain.  Gallbladder removed.  HIDA 9%  Heartburn sometimes.  Weaning off omperazole.  Biopsy reviewed as chronic mild inflammation.    PMH    Hypothryoidism  Prediabetes based on fasting sugars  Hyperlipidemia  bradycardia- several years ago. Rate dropped to 40's.  60's is her baseline.  Had a stress test (nuclear medicine)  Dr. Diona BrownerMcDowell 2009  Sister had complete heart block but she does not.  Sister and brother had MI at 5147 and 2344 (heavy smoking)  Father had MI 3869 and aortic aneurysm.  Mild depression- wellbutrin XL  Diverticulitis history.  Taking stool softener.    OP - was on fosomax in the past- about 5 years      Cough has improved.  Can't go up hill without getting SOB for a couple of years.  Not with walking up steps.    Every 2 hrs to go to bathroom.  Saw GYN.  Vaginal atrophy.  Helps some.  Urgency.  No incontinence.  6 or 7 pm.        Worked in place in [place]-exposed to mold.  Had allergen testing and found to be allergic to candida?        Assessment and Plan        1. Hypothyroidism, unspecified type  Recheck levels and correct.  Pt states she felt better when her PCP put her on cytomel.  - TSH REFLEX TO T4  - THYROID PROFILE II  - CBC WITH AUTOMATED DIFF  - METABOLIC PANEL, COMPREHENSIVE    2. Pure hypercholesterolemia  On low dose pravastatin and not at goal.  Had problems with zocor.  - DUPLEX CAROTID BILATERAL; Future  - ULTRASOUND EXAM AAA SCREEN; Future    3. Encounter for screening for cardiovascular disorders    Screen for AAA.  Has FH and risk factors.    - ULTRASOUND EXAM AAA SCREEN; Future    4. History of sinus bradycardia  When she had too much lidocaine she thinks.    5. Family history of heart attack  May need to start baby aspirin.  Start exercising daily.    6. History of prediabetes  Increase exercise and reduce sweets and carbs at night.    7. Hx of diverticulitis of colon  No recent events.  Takes colace regularly.  Drink water with her fiber.    8. Hx of endoscopy  Some inflammation  Mild.  Pt was told she can wean off the PPI.    9. Osteoporosis  Used to have OP and now off since treatment for 5 years.  Will get old records.    10. History of cholecystectomy  Recent this year.  Had GB sludge.  No abdo pain.    11. Dysthymia  Doing well on wellbutrin.  Daughter is Charity fundraiserN and is close to her.          Historical Data    Past Medical  History   Diagnosis Date   ??? Arthritis      hands/knees   ??? Family history of heart attack 05/08/2015   ??? GERD (gastroesophageal reflux disease)    ??? History of prediabetes 05/08/2015   ??? History of sinus bradycardia 05/08/2015   ??? Hx of diverticulitis of colon 05/08/2015   ??? Hypothyroidism 05/08/2015   ??? Pure hypercholesterolemia 05/08/2015   ??? Thyroid disease        Past Surgical History   Procedure Laterality Date   ??? Hx tubal ligation     ??? Hx other surgical       colon polyp removed   ??? Upper gi endoscopy,biopsy  01/06/2015         ??? Hx retinal detachment repair Right 2008     tears   ??? Hx cataract removal Bilateral 2010       Outpatient Encounter Prescriptions as of 05/08/2015   Medication Sig Dispense Refill   ??? GUAIFENESIN/DEXTROMETHORPHAN (TUSSIN DM MAX PO) Take  by mouth.     ??? albuterol (PROVENTIL HFA, VENTOLIN HFA, PROAIR HFA) 90 mcg/actuation inhaler Take 2 Puffs by inhalation every four (4) hours as needed for Wheezing. 1 Inhaler 0   ??? docusate sodium (COLACE) 100 mg capsule Take 1 Cap by mouth two (2)  times a day. May use OTC instead. Prevents constipation from narcotics. 60 Cap 0   ??? ibuprofen (MOTRIN) 600 mg tablet Take 1 Tab by mouth three (3) times daily (with meals). 21 Tab 0   ??? omeprazole (PRILOSEC) 40 mg capsule Take 1 Cap by mouth daily. Indications: EROSIVE ESOPHAGITIS 30 Cap 6   ??? buPROPion SR (WELLBUTRIN SR) 150 mg SR tablet Take 150 mg by mouth daily.     ??? pravastatin (PRAVACHOL) 20 mg tablet Take 20 mg by mouth nightly.     ??? liothyronine (CYTOMEL) 5 mcg tablet Take 5 mcg by mouth daily.     ??? BENEFIBER, WHEAT DEXTRIN, PO Take  by mouth daily.     ??? ascorbic acid (VITAMIN C) 1,000 mg tablet Take 1,000 mg by mouth daily. 2 daily     ??? calcium-magnesium-zinc Tab Take 1,000 mg by mouth daily.     ??? FLAXSEED OIL Take 1,000 mg by mouth two (2) times a day.     ??? ERGOCALCIFEROL (VITAMIN D PO) Take 5,000 mg by mouth daily.     ??? levothyroxine (SYNTHROID) 100 mcg tablet Take 100 mcg by mouth daily (before breakfast).     ??? azithromycin (ZITHROMAX) 250 mg tablet Take two tablets today then one tablet daily 6 Tab 0   ??? chlorpheniramine-HYDROcodone (TUSSIONEX PENNKINETIC ER) 10-8 mg/5 mL suspension Take 5 mL by mouth every twelve (12) hours as needed for Cough. Max Daily Amount: 10 mL. 60 mL 0   ??? methylPREDNISolone (MEDROL, PAK,) 4 mg tablet Take as directed. 1 Dose Pack 0   ??? sumatriptan (IMITREX) 25 mg tablet Take 25 mg by mouth once as needed.       No facility-administered encounter medications on file as of 05/08/2015.         Allergies   Allergen Reactions   ??? Simvastatin Myalgia        Social History     Social History   ??? Marital status: DIVORCED     Spouse name: N/A   ??? Number of children: N/A   ??? Years of education: N/A     Occupational History   ??? Not on file.  Social History Main Topics   ??? Smoking status: Never Smoker   ??? Smokeless tobacco: Never Used   ??? Alcohol use No   ??? Drug use: No   ??? Sexual activity: No     Other Topics Concern   ??? Not on file     Social History Narrative         Review of Systems   Constitutional: Negative for chills, diaphoresis, fever, malaise/fatigue and weight loss.   HENT: Negative for hearing loss.    Respiratory: Negative for cough.    Cardiovascular: Negative for chest pain.   Gastrointestinal: Negative for blood in stool and constipation.   Genitourinary: Negative for dysuria, flank pain, frequency and urgency.   Musculoskeletal: Negative for myalgias.   Skin: Negative for rash.   Neurological: Negative for dizziness, weakness and headaches.   Endo/Heme/Allergies: Does not bruise/bleed easily.         Visit Vitals   ??? BP 108/64 (BP 1 Location: Left arm, BP Patient Position: Sitting)   ??? Pulse 71   ??? Temp 97.9 ??F (36.6 ??C) (Oral)   ??? Resp 14   ??? Ht 5' 4.75" (1.645 m)   ??? Wt 167 lb (75.8 kg)   ??? SpO2 98%   ??? BMI 28.01 kg/m2         Physical Exam   Constitutional: She is oriented to person, place, and time. She appears well-nourished. No distress.   Neck: No thyromegaly present.   Cardiovascular: Normal rate, regular rhythm and normal heart sounds.    Pulmonary/Chest: Effort normal and breath sounds normal. No respiratory distress. She has no wheezes.   Abdominal: Soft. Bowel sounds are normal. She exhibits no mass. There is no tenderness.   Musculoskeletal: She exhibits no edema or tenderness.   Lymphadenopathy:     She has no cervical adenopathy.   Neurological: She is alert and oriented to person, place, and time.   Skin: Skin is warm and dry. No rash noted. No erythema.   Psychiatric: She has a normal mood and affect. Thought content normal.   Nursing note and vitals reviewed.         Assessment:  See Above for discussion/plan.    Orders Placed This Encounter   ??? DUPLEX CAROTID BILATERAL     Standing Status:   Future     Standing Expiration Date:   06/07/2016     Order Specific Question:   Reason for Exam     Answer:   hx of plaque in arteries   ??? ULTRASOUND EXAM AAA SCREEN     Standing Status:   Future     Standing Expiration Date:   11/04/2015      Order Specific Question:   Reason for Exam:     Answer:   hx of high cholesterol.  family hx of AAA   ??? TSH REFLEX TO T4   ??? THYROID PROFILE II   ??? CBC WITH AUTOMATED DIFF   ??? METABOLIC PANEL, COMPREHENSIVE        I have reviewed the patient's medical history in detail and updated the computerized patient record.     We had a prolonged discussion about these complex clinical issues and went over the various important aspects to consider. All questions were answered.     Advised her to call back or return to office if symptoms do not improve, change in nature, or persist.    She was given an after visit summary or informed of MyChart  Access which includes patient instructions, diagnoses, current medications, & vitals.  She expressed understanding with the diagnosis and plan.

## 2015-05-08 NOTE — Progress Notes (Signed)
Reviewed record in preparation for visit and have obtained necessary documentation.    Identified pt with two pt identifiers(name and DOB).      Health Maintenance Due   Topic   ??? DTaP/Tdap/Td series (1 - Tdap)   ??? ZOSTER VACCINE AGE 76>    ??? GLAUCOMA SCREENING Q2Y    ??? OSTEOPOROSIS SCREENING (DEXA)    ??? MEDICARE YEARLY EXAM    ??? Pneumococcal 65+ Low/Medium Risk (2 of 2 - PPSV23)         Chief Complaint   Patient presents with   ??? New Patient        Wt Readings from Last 3 Encounters:   05/08/15 167 lb (75.8 kg)   03/17/15 171 lb (77.6 kg)   02/20/15 166 lb 12.8 oz (75.7 kg)     Temp Readings from Last 3 Encounters:   05/08/15 97.9 ??F (36.6 ??C) (Oral)   03/17/15 97.8 ??F (36.6 ??C)   01/20/15 98 ??F (36.7 ??C)     BP Readings from Last 3 Encounters:   05/08/15 108/64   03/17/15 138/77   02/20/15 125/75     Pulse Readings from Last 3 Encounters:   05/08/15 71   03/17/15 66   02/20/15 78           Learning Assessment:  :     No flowsheet data found.    Depression Screening:  :     No flowsheet data found.    Fall Risk Assessment:  :     No flowsheet data found.    Abuse Screening:  :     No flowsheet data found.    Coordination of Care Questionnaire:  :     1) Have you been to an emergency room, urgent care clinic since your last visit? no   Hospitalized since your last visit? no             2) Have you seen or consulted any other health care providers outside of [facility] since your last visit? no  (Include any pap smears or colon screenings in this section.)    3) Do you have an Advance Directive on file? no    4) Are you interested in receiving information on Advance Directives? NO      Patient is accompanied by daughter I have received verbal consent from Adrienne Mochaarnell H Stanczyk to discuss any/all medical information while they are present in the room.

## 2015-05-09 LAB — CBC WITH AUTOMATED DIFF
ABS. BASOPHILS: 0 10*3/uL (ref 0.0–0.2)
ABS. EOSINOPHILS: 0.1 10*3/uL (ref 0.0–0.4)
ABS. IMM. GRANS.: 0 10*3/uL (ref 0.0–0.1)
ABS. MONOCYTES: 0.7 10*3/uL (ref 0.1–0.9)
ABS. NEUTROPHILS: 5.2 10*3/uL (ref 1.4–7.0)
Abs Lymphocytes: 2.2 10*3/uL (ref 0.7–3.1)
BASOPHILS: 0 %
EOSINOPHILS: 1 %
HCT: 42.4 % (ref 34.0–46.6)
HGB: 14.3 g/dL (ref 11.1–15.9)
IMMATURE GRANULOCYTES: 0 %
Lymphocytes: 27 %
MCH: 30.9 pg (ref 26.6–33.0)
MCHC: 33.7 g/dL (ref 31.5–35.7)
MCV: 92 fL (ref 79–97)
MONOCYTES: 8 %
NEUTROPHILS: 64 %
PLATELET: 310 10*3/uL (ref 150–379)
RBC: 4.63 x10E6/uL (ref 3.77–5.28)
RDW: 14.1 % (ref 12.3–15.4)
WBC: 8.2 10*3/uL (ref 3.4–10.8)

## 2015-05-09 LAB — METABOLIC PANEL, COMPREHENSIVE
A-G Ratio: 2.4 (ref 1.1–2.5)
ALT (SGPT): 18 IU/L (ref 0–32)
AST (SGOT): 20 IU/L (ref 0–40)
Albumin: 4.6 g/dL (ref 3.5–4.8)
Alk. phosphatase: 74 IU/L (ref 39–117)
BUN/Creatinine ratio: 20 (ref 11–26)
BUN: 18 mg/dL (ref 8–27)
Bilirubin, total: 0.3 mg/dL (ref 0.0–1.2)
CO2: 26 mmol/L (ref 18–29)
Calcium: 9.6 mg/dL (ref 8.7–10.3)
Chloride: 98 mmol/L (ref 96–106)
Creatinine: 0.9 mg/dL (ref 0.57–1.00)
GFR est AA: 72 mL/min/{1.73_m2} (ref >59)
GFR est non-AA: 62 mL/min/{1.73_m2} (ref >59)
GLOBULIN, TOTAL: 1.9 g/dL (ref 1.5–4.5)
Glucose: 93 mg/dL (ref 65–99)
Potassium: 4.6 mmol/L (ref 3.5–5.2)
Protein, total: 6.5 g/dL (ref 6.0–8.5)
Sodium: 141 mmol/L (ref 134–144)

## 2015-05-09 LAB — THYROID PROFILE II
Free Thyroxine Index (FTI): 2.9 (ref 1.2–4.9)
T3 Uptake: 26 % (ref 24–39)
T3, total: 115 ng/dL (ref 71–180)
T4, Total: 11.2 ug/dL (ref 4.5–12.0)
TSH: 1.45 u[IU]/mL (ref 0.450–4.500)

## 2015-05-09 LAB — TSH REFLEX TO T4: TSH: 1.48 u[IU]/mL (ref 0.450–4.500)

## 2015-05-11 ENCOUNTER — Encounter: Primary: Internal Medicine

## 2015-05-12 ENCOUNTER — Encounter: Primary: Internal Medicine

## 2015-05-13 ENCOUNTER — Encounter

## 2015-05-13 ENCOUNTER — Inpatient Hospital Stay: Admit: 2015-05-13 | Payer: MEDICARE | Attending: Internal Medicine | Primary: Internal Medicine

## 2015-05-13 ENCOUNTER — Inpatient Hospital Stay: Payer: MEDICARE | Attending: Internal Medicine | Primary: Internal Medicine

## 2015-05-13 DIAGNOSIS — Z136 Encounter for screening for cardiovascular disorders: Secondary | ICD-10-CM

## 2015-05-13 DIAGNOSIS — E7800 Pure hypercholesterolemia, unspecified: Secondary | ICD-10-CM

## 2015-05-13 NOTE — Progress Notes (Signed)
Carotid duplex completed. Final results to follow.

## 2015-05-16 NOTE — Procedures (Signed)
[facility]  *** FINAL REPORT ***    Name: Leslie Potter, Leslie Potter  MRN: [id number]    Outpatient  DOB: 17 Jan 1941  HIS Order #: [id number]  TRAKnet Visit #: [id number]  Date: 13 May 2015    TYPE OF TEST: Cerebrovascular Duplex    REASON FOR TEST  Follow up "history of plaquing in arteries". Denies symptoms at  present.    Right Carotid:-             Proximal               Mid                 Distal  cm/s  Systolic  Diastolic  Systolic  Diastolic  Systolic  Diastolic  CCA:     08.666.4      26.0                            97.7      28.6  Bulb:  ECA:     93.8      16.8  ICA:     56.9      17.5       72.4      25.8       42.6      14.1  ICA/CCA:  0.6       0.6    ICA Stenosis: <50%    Right Vertebral:-  Finding: Antegrade  Sys:       69.8  Dia:       23.2    Right Subclavian: Normal    Left Carotid:-            Proximal                Mid                 Distal  cm/s  Systolic  Diastolic  Systolic  Diastolic  Systolic  Diastolic  CCA:     57.862.0      27.0                            76.2      28.3  Bulb:  ECA:     74.9      12.8  ICA:     60.7      24.5       82.4      32.3       91.7      38.7  ICA/CCA:  0.8       0.9    ICA Stenosis: <50%    Left Vertebral:-  Finding: Antegrade  Sys:       50.3  Dia:       20.6    Left Subclavian: Normal    INTERPRETATION/FINDINGS  PROCEDURE:  Evaluation of the extracranial cerebrovascular arteries  with ultrasound (B-mode imaging, pulsed Doppler, color Doppler).  Includes the common carotid, internal carotid, external carotid, and  vertebral arteries.    FINDINGS:    IMPRESSION: Findings are consistent with 0-49% range of stenosis of  the right internal carotid and 0-49% range of stenosis of the left  internal carotid.  Vertebrals are patent with antegrade flow.  Very  tortuous vessels bilaterally.    ADDITIONAL COMMENTS    I have personally reviewed the data relevant to the interpretation of  this  study.    TECHNOLOGIST: Stephanie Acre, RVT  Signed: 05/13/2015 09:45 AM    PHYSICIAN:  Vernia Buff T. Sheliah Hatch, MD  Signed: 05/16/2015 09:18 AM

## 2015-05-16 NOTE — Procedures (Signed)
[facility]  *** FINAL REPORT ***    Name: Leslie Potter, Ardine  MRN: [id number]    Outpatient  DOB: 17 Jan 1941  HIS Order #: [id number]  TRAKnet Visit #: [id number]  Date: 13 May 2015    TYPE OF TEST: Cerebrovascular Duplex    REASON FOR TEST  Follow up "history of plaquing in arteries". Denies symptoms at  present.    Right Carotid:-             Proximal               Mid                 Distal  cm/s  Systolic  Diastolic  Systolic  Diastolic  Systolic  Diastolic  CCA:     08.666.4      26.0                            97.7      28.6  Bulb:  ECA:     93.8      16.8  ICA:     56.9      17.5       72.4      25.8       42.6      14.1  ICA/CCA:  0.6       0.6    ICA Stenosis: <50%    Right Vertebral:-  Finding: Antegrade  Sys:       69.8  Dia:       23.2    Right Subclavian: Normal    Left Carotid:-            Proximal                Mid                 Distal  cm/s  Systolic  Diastolic  Systolic  Diastolic  Systolic  Diastolic  CCA:     57.862.0      27.0                            76.2      28.3  Bulb:  ECA:     74.9      12.8  ICA:     60.7      24.5       82.4      32.3       91.7      38.7  ICA/CCA:  0.8       0.9    ICA Stenosis: <50%    Left Vertebral:-  Finding: Antegrade  Sys:       50.3  Dia:       20.6    Left Subclavian: Normal    INTERPRETATION/FINDINGS  PROCEDURE:  Evaluation of the extracranial cerebrovascular arteries  with ultrasound (B-mode imaging, pulsed Doppler, color Doppler).  Includes the common carotid, internal carotid, external carotid, and  vertebral arteries.    FINDINGS:    IMPRESSION: Findings are consistent with 0-49% range of stenosis of  the right internal carotid and 0-49% range of stenosis of the left  internal carotid.  Vertebrals are patent with antegrade flow.  Very  tortuous vessels bilaterally.    ADDITIONAL COMMENTS    I have personally reviewed the data relevant to the interpretation of  this  study.    TECHNOLOGIST: Stephanie Acre, RVT  Signed: 05/13/2015 09:45 AM     PHYSICIAN: Vernia Buff T. Sheliah Hatch, MD  Signed: 05/16/2015 09:18 AM

## 2015-05-16 NOTE — Progress Notes (Signed)
Patient notified her tests were normal.

## 2015-05-16 NOTE — Progress Notes (Signed)
Call both ultrasound of her aorta and her carotids were normal.  Signed By: Sonia Sideichard Y Shigeko Manard, MD   9:47 AM    May 16, 2015

## 2015-06-19 ENCOUNTER — Ambulatory Visit: Admit: 2015-06-19 | Discharge: 2015-06-19 | Payer: MEDICARE | Attending: Internal Medicine | Primary: Internal Medicine

## 2015-06-19 DIAGNOSIS — J4530 Mild persistent asthma, uncomplicated: Secondary | ICD-10-CM

## 2015-06-19 MED ORDER — PRAVASTATIN 40 MG TAB
40 mg | ORAL_TABLET | Freq: Every evening | ORAL | 12 refills | Status: DC
Start: 2015-06-19 — End: 2016-07-31

## 2015-06-19 MED ORDER — LIOTHYRONINE 5 MCG TAB
5 mcg | ORAL_TABLET | Freq: Every day | ORAL | 4 refills | Status: AC
Start: 2015-06-19 — End: ?

## 2015-06-19 MED ORDER — LEVOTHYROXINE 100 MCG TAB
100 mcg | ORAL_TABLET | Freq: Every day | ORAL | 4 refills | Status: DC
Start: 2015-06-19 — End: 2015-09-14

## 2015-06-19 MED ORDER — ALBUTEROL SULFATE HFA 90 MCG/ACTUATION AEROSOL INHALER
90 mcg/actuation | RESPIRATORY_TRACT | 0 refills | Status: DC | PRN
Start: 2015-06-19 — End: 2016-01-26

## 2015-06-19 MED ORDER — OMEPRAZOLE 40 MG CAP, DELAYED RELEASE
40 mg | ORAL_CAPSULE | Freq: Every day | ORAL | 6 refills | Status: AC
Start: 2015-06-19 — End: ?

## 2015-06-19 MED ORDER — DIPHTH,PERTUS(ACEL)TETANUS VAC(PF) 2 LF-(5-3-5MCG)-5 LF/0.5 ML IM SUSP
2 Lf-(.5-5-3-5 mcg)-5Lf/0.5 mL | Freq: Once | INTRAMUSCULAR | 0 refills | Status: AC
Start: 2015-06-19 — End: 2015-06-19

## 2015-06-19 MED ORDER — PRAVASTATIN 20 MG TAB
20 mg | ORAL_TABLET | Freq: Every evening | ORAL | 3 refills | Status: DC
Start: 2015-06-19 — End: 2015-06-19

## 2015-06-19 NOTE — Progress Notes (Signed)
Leslie Potter is a 75 y.o. female and presents for annual Medicare Wellness Visit.    Assessment of cognitive impairment: Alert and oriented x 3.  Patient denies concerns about cognitive impairment.      Problem List: Reviewed with patient and discussed risk factors.    Patient Active Problem List   Diagnosis Code   ??? Hypothyroidism E03.9   ??? Pure hypercholesterolemia E78.00   ??? History of sinus bradycardia Z86.79   ??? Family history of heart attack Z82.49   ??? History of prediabetes Z87.898   ??? Hx of diverticulitis of colon Z87.19   ??? History of cholecystectomy Z90.49   ??? Dysthymia F34.1       Current medical providers:  Patient Care Team:  Leslie Side, MD as PCP - General (Internal Medicine)  Leslie Mallick, MD as Surgeon (General Surgery)        End of Life Planning: This was discussed with her today and she has an advanced directive - a copy HAS NOT been provided.    Reviewed DNR/DNI and patient is no.      Depression Screen:   Denies sense of hopelessness.  PHQ-2 reviewed. No flowsheet data found.       Fall Risk:   Fall Risk Assessment, last 12 mths 06/19/2015   Able to walk? Yes   Fall in past 12 months? No       Hearing Loss:  mild, denies any hearing loss    Activities of Daily Living:  Self-care.   Requires assistance with: no ADLs    Adult Nutrition Screen:  No risk factors noted.      Health Maintenance    AAA Screening: normal, n/a (IPPE only)  Glaucoma Screening: Yes      Health Maintenance Due   Topic Date Due   ??? DTaP/Tdap/Td series (1 - Tdap) 01/17/1960   ??? GLAUCOMA SCREENING Q2Y  01/17/2004   ??? MEDICARE YEARLY EXAM  01/17/2004   ??? Pneumococcal 65+ Low/Medium Risk (2 of 2 - PPSV23) 04/19/2014        Health Maintenance reviewed - completed.    Plan:      Orders Placed This Encounter   ??? diph,Pertuss,Acell,,Tet Vac-PF (ADACEL) 2 Lf-(2.5-5-3-5 mcg)-5Lf/0.5 mL susp   ??? liothyronine (CYTOMEL) 5 mcg tablet   ??? levothyroxine (SYNTHROID) 100 mcg tablet   ??? omeprazole (PRILOSEC) 40 mg capsule    ??? pravastatin (PRAVACHOL) 20 mg tablet           Plan:    Leslie Potter was seen today for annual wellness visit.    Diagnoses and all orders for this visit:    Medicare annual wellness visit, subsequent    Other orders  -     diph,Pertuss,Acell,,Tet Vac-PF (ADACEL) 2 Lf-(2.5-5-3-5 mcg)-5Lf/0.5 mL susp; 0.5 mL by IntraMUSCular route once for 1 dose.  -     liothyronine (CYTOMEL) 5 mcg tablet; Take 1 Tab by mouth daily.  -     levothyroxine (SYNTHROID) 100 mcg tablet; Take 1 Tab by mouth Daily (before breakfast).  -     omeprazole (PRILOSEC) 40 mg capsule; Take 1 Cap by mouth daily. Indications: GASTROESOPHAGEAL REFLUX  -     pravastatin (PRAVACHOL) 20 mg tablet; Take 1 Tab by mouth nightly.      Orders Placed This Encounter   ??? diph,Pertuss,Acell,,Tet Vac-PF (ADACEL) 2 Lf-(2.5-5-3-5 mcg)-5Lf/0.5 mL susp   ??? liothyronine (CYTOMEL) 5 mcg tablet   ??? levothyroxine (SYNTHROID) 100 mcg tablet   ??? omeprazole (  PRILOSEC) 40 mg capsule   ??? pravastatin (PRAVACHOL) 20 mg tablet         Follow-up Disposition: Not on File  Reviewed with patient the treatment plan, goals of treatment plan, and limitations of treatment plan, to include the potential for Potter effects from medications and procedures. If Potter effects occur, it is the responsibility of the patient to inform the clinic so that a change in the treatment plan can be made in a safe manner. The patient is advised that stopping prescribed medication may cause an increase in symptoms and possible medication withdrawal symptoms. The patient is informed an emergency room evaluation may be necessary if this occurs.      Patient verbalized understanding and is in agreement with treatment plan as outlined above.  All questions answered.     Annual Wellness Visit (Medicare Wellness)       HPI:  Leslie Potter is a 75 y.o. year old female who is here for a follow up visit.  She was last seen by me on 05/08/2015.   She reports the following:         Runny nose, sniffling, water eyes.  claritin helps some.    Tried nasonex.    SOB- last year but worse 6 months.  At rest and with exertion.  Chest feels tight and labored.  During exertion, SOB get worse.  No CP.  Tightness gets worse.  Had stress test 2014 and echo in 2016.  Allergy worse in last 6 months.  She has to stop sometimes.    Uses proventil for bronchitis at one time.  It did help at the same time.      Dry cough.    Stopped bupropion sr and daughter says doing well.         Assessment and Plan        1. Medicare annual wellness visit, subsequent  See above.      2. Reactive airway disease, mild persistent, uncomplicated  depomedrol injection to see if SOB improves with her severe allergies congestion.  - albuterol (PROVENTIL HFA, VENTOLIN HFA, PROAIR HFA) 90 mcg/actuation inhaler; Take 2 Puffs by inhalation every four (4) hours as needed for Wheezing.  Dispense: 1 Inhaler; Refill: 0  - THER/PROPH/DIAG INJECTION, SUBCUT/IM  - METHYLPREDNISOLONE ACETATE INJECTION 80 MG  - CBC WITH AUTOMATED DIFF    3. Hypothyroidism, unspecified type  Recheck TSH  - LIPID PANEL  - TSH REFLEX TO T4    4. History of prediabetes  Recheck blood sugar.   Steroids will increase blood sugars.  - HEMOGLOBIN A1C WITH EAG  - METABOLIC PANEL, COMPREHENSIVE    5. Chronic hyperglycemia  Recheck a1c.  - HEMOGLOBIN A1C WITH EAG      Visit Vitals   ??? BP 132/80 (BP 1 Location: Right arm, BP Patient Position: Sitting)   ??? Pulse 62   ??? Temp 96.3 ??F (35.7 ??C) (Oral)   ??? Resp 16   ??? Ht 5' 4.75" (1.645 m)   ??? Wt 167 lb 12.8 oz (76.1 kg)   ??? SpO2 98%   ??? BMI 28.14 kg/m2       Historical Data    Past Medical History   Diagnosis Date   ??? Arthritis      hands/knees   ??? Dysthymia 05/08/2015   ??? Family history of heart attack 05/08/2015   ??? GERD (gastroesophageal reflux disease)    ??? History of cholecystectomy 05/08/2015   ??? History of prediabetes 05/08/2015   ???  History of sinus bradycardia 05/08/2015   ??? Hx of diverticulitis of colon 05/08/2015    ??? Hypothyroidism 05/08/2015   ??? Pure hypercholesterolemia 05/08/2015   ??? Thyroid disease        Past Surgical History   Procedure Laterality Date   ??? Hx tubal ligation     ??? Hx other surgical       colon polyp removed   ??? Upper gi endoscopy,biopsy  01/06/2015         ??? Hx retinal detachment repair Right 2008     tears   ??? Hx cataract removal Bilateral 2010       Outpatient Encounter Prescriptions as of 06/19/2015   Medication Sig Dispense Refill   ??? diph,Pertuss,Acell,,Tet Vac-PF (ADACEL) 2 Lf-(2.5-5-3-5 mcg)-5Lf/0.5 mL susp 0.5 mL by IntraMUSCular route once for 1 dose. 0.5 mL 0   ??? liothyronine (CYTOMEL) 5 mcg tablet Take 1 Tab by mouth daily. 90 Tab 4   ??? levothyroxine (SYNTHROID) 100 mcg tablet Take 1 Tab by mouth Daily (before breakfast). 90 Tab 4   ??? omeprazole (PRILOSEC) 40 mg capsule Take 1 Cap by mouth daily. Indications: GASTROESOPHAGEAL REFLUX 90 Cap 6   ??? pravastatin (PRAVACHOL) 20 mg tablet Take 1 Tab by mouth nightly. 90 Tab 3   ??? albuterol (PROVENTIL HFA, VENTOLIN HFA, PROAIR HFA) 90 mcg/actuation inhaler Take 2 Puffs by inhalation every four (4) hours as needed for Wheezing. 1 Inhaler 0   ??? docusate sodium (COLACE) 100 mg capsule Take 1 Cap by mouth two (2) times a day. May use OTC instead. Prevents constipation from narcotics. 60 Cap 0   ??? BENEFIBER, WHEAT DEXTRIN, PO Take  by mouth daily.     ??? ascorbic acid (VITAMIN C) 1,000 mg tablet Take 1,000 mg by mouth daily. 2 daily     ??? calcium-magnesium-zinc Tab Take 1,000 mg by mouth daily.     ??? FLAXSEED OIL Take 1,000 mg by mouth two (2) times a day.     ??? GUAIFENESIN/DEXTROMETHORPHAN (TUSSIN DM MAX PO) Take  by mouth.     ??? [DISCONTINUED] omeprazole (PRILOSEC) 40 mg capsule Take 1 Cap by mouth daily. Indications: EROSIVE ESOPHAGITIS 30 Cap 6   ??? buPROPion SR (WELLBUTRIN SR) 150 mg SR tablet Take 150 mg by mouth daily.     ??? [DISCONTINUED] pravastatin (PRAVACHOL) 20 mg tablet Take 20 mg by mouth nightly.      ??? [DISCONTINUED] liothyronine (CYTOMEL) 5 mcg tablet Take 5 mcg by mouth daily.     ??? [DISCONTINUED] levothyroxine (SYNTHROID) 100 mcg tablet Take 100 mcg by mouth daily (before breakfast).       No facility-administered encounter medications on file as of 06/19/2015.         Allergies   Allergen Reactions   ??? Simvastatin Myalgia        Social History     Social History   ??? Marital status: DIVORCED     Spouse name: N/A   ??? Number of children: N/A   ??? Years of education: N/A     Occupational History   ??? Not on file.     Social History Main Topics   ??? Smoking status: Never Smoker   ??? Smokeless tobacco: Never Used   ??? Alcohol use No   ??? Drug use: No   ??? Sexual activity: No     Other Topics Concern   ??? Not on file     Social History Narrative  Review of Systems   Constitutional: Negative for weight loss.   Eyes: Negative for blurred vision.   Respiratory: Negative for shortness of breath.    Cardiovascular: Negative for chest pain.   Gastrointestinal: Negative for abdominal pain.   Genitourinary: Negative for dysuria and frequency.   Skin: Negative for rash.   Neurological: Negative for dizziness, weakness and headaches.         Physical Exam   Constitutional: She appears well-developed and well-nourished. She is active.  Non-toxic appearance. She does not have a sickly appearance. She does not appear ill. No distress.   Eyes: Conjunctivae are normal.   Cardiovascular: Normal rate, regular rhythm, S1 normal, S2 normal, normal heart sounds and normal pulses.  Exam reveals no gallop and no friction rub.    Pulmonary/Chest: Effort normal and breath sounds normal. No respiratory distress.   Abdominal: Soft. Bowel sounds are normal.   Musculoskeletal: She exhibits no edema or deformity.   Neurological: She is alert.   Skin: Skin is warm and dry. No rash noted. No pallor.   Psychiatric: She has a normal mood and affect. Her behavior is normal.          Orders Placed This Encounter    ??? diph,Pertuss,Acell,,Tet Vac-PF (ADACEL) 2 Lf-(2.5-5-3-5 mcg)-5Lf/0.5 mL susp     Sig: 0.5 mL by IntraMUSCular route once for 1 dose.     Dispense:  0.5 mL     Refill:  0   ??? liothyronine (CYTOMEL) 5 mcg tablet     Sig: Take 1 Tab by mouth daily.     Dispense:  90 Tab     Refill:  4   ??? levothyroxine (SYNTHROID) 100 mcg tablet     Sig: Take 1 Tab by mouth Daily (before breakfast).     Dispense:  90 Tab     Refill:  4   ??? omeprazole (PRILOSEC) 40 mg capsule     Sig: Take 1 Cap by mouth daily. Indications: GASTROESOPHAGEAL REFLUX     Dispense:  90 Cap     Refill:  6   ??? pravastatin (PRAVACHOL) 20 mg tablet     Sig: Take 1 Tab by mouth nightly.     Dispense:  90 Tab     Refill:  3        I have reviewed the patient's medical history in detail and updated the computerized patient record.     We had a prolonged discussion about these complex clinical issues and went over the various important aspects to consider. All questions were answered.     Advised her to call back or return to office if symptoms do not improve, change in nature, or persist.    She was given an after visit summary or informed of MyChart Access which includes patient instructions, diagnoses, current medications, & vitals.  She expressed understanding with the diagnosis and plan.

## 2015-06-19 NOTE — Progress Notes (Signed)
Leslie Potter is a 74 y.o. female  Chief Complaint   Patient presents with   ??? Annual Wellness Visit     Medicare Wellness     1. Have you been to the ER, urgent care clinic since your last visit?  Hospitalized since your last visit? No    2. Have you seen or consulted any other health care providers outside of the [facility] since your last visit?  Include any pap smears or colon screening.  No

## 2015-06-19 NOTE — Patient Instructions (Signed)
Home-made Nasal Saline Rinse  Directions for preparing home-made nasal saline  1. Clean a 1-Quart glass jar carefully, then fill it with bottled or boiled water.  2. Add 1 or 2 heaping teaspoons of pickling or canning salt, or Kosher salt. If you use table salt, you may be getting a preservative and/or additive which might irritate your nose.  3. Add 1 rounded teaspoon of baking soda (pure bicarbonate).  4. Store at room temperature and shake or stir before each use.  5. Mix a new batch weekly.  To use:  1. Pour some of the mixture into a clean bowl. Warming it to body temperature may help, but make sure it is not hot.  2. Fill a spray bottle or a bulb syringe.   3. Stand over the sink or in the shower and apply gentle pressure on the syringe or bottle so that the the mixture goes into one side of the nose and comes out the other side.  Tilt your head so you are inserting the syringe on the top nostril and forward so it doesn't go to the back of throat.  Use slight pressure from the syringe and allow the mixture to come out the other nostril on the bottom as your head is tilted.  Rinse the nose at daily especially at night before bedtime to wash away all of the allergens you accumulated during the day.   You will breathe better and therefore sleep deeper.      Don't wear shoes in the house.  Don't change clothes near the bed.  Wash your hair before bedtime    nasacort aq, fexofenadine, and saline washes

## 2015-07-06 ENCOUNTER — Ambulatory Visit (INDEPENDENT_AMBULATORY_CARE_PROVIDER_SITE_OTHER): Payer: Medicare Other | Admitting: Cardiology

## 2015-07-06 ENCOUNTER — Encounter: Payer: Self-pay | Admitting: Cardiology

## 2015-07-06 VITALS — BP 112/68 | HR 56 | Ht 64.0 in | Wt 169.2 lb

## 2015-07-06 DIAGNOSIS — I2510 Atherosclerotic heart disease of native coronary artery without angina pectoris: Secondary | ICD-10-CM

## 2015-07-06 DIAGNOSIS — E782 Mixed hyperlipidemia: Secondary | ICD-10-CM

## 2015-07-06 NOTE — Patient Instructions (Signed)

## 2015-07-06 NOTE — Progress Notes (Signed)
Cardiology Office Note  Date: 07/06/2015   ID: Amanda Jennings, DOB February 01, 1941, MRN [id number]  PCP: Juline Patch, MD  Primary Cardiologist: Nona Dell, MD   Chief Complaint  Patient presents with  . Cardiac follow-up    History of Present Illness: Amanda Jennings is a 75 y.o. female last seen in February 2016. She presents for a follow-up visit. She states that over the last year she had her gallbladder removed, also has had trouble with recurrent bronchitis. She does not endorse any clear-cut angina symptoms and generally has NYHA class II dyspnea with typical activities. She has been less active in terms of her regular walking regimen. I reviewed her ECG done today which shows sinus bradycardia with borderline low voltage and leftward axis.  I reviewed her last lipid panel which is outlined below. LDL was 131. She states that Pravachol was increased to 40 mg daily at that time.  We discussed her medications. She is on Pravachol, Co-Q 10, and omega-3 supplements.  Echocardiogram done last year showed LVEF of 60% with mild left atrial enlargement, no major valvular abnormalities.  Past Medical History  Diagnosis Date  . Hyperlipidemia   . Hypothyroidism   . Cataracts, bilateral   . Osteopenia   . Cholelithiasis   . Depression   . History of positive PPD   . GERD (gastroesophageal reflux disease)   . Arthritis   . Hx of migraines   . Coronary atherosclerosis of native coronary artery     Mild atherosclerosis  . Bradycardia     Current Outpatient Prescriptions  Medication Sig Dispense Refill  . Ascorbic Acid (VITAMIN C ER PO) Take 4 tablets by mouth 2 (two) times daily.    . Calcium in Bone Mineral Cmplx (OSTIGEN) 350 MG MISC Take 2 each by mouth 2 (two) times daily.    . Calcium-Magnesium 500-250 MG TABS Take 1 tablet by mouth daily.     Marland Kitchen CHERATUSSIN DAC 30-10-100 MG/5ML solution Take 5 mLs by mouth at bedtime as needed.     . Cholecalciferol (VITAMIN D3) 5000  UNITS CAPS Take 1 capsule by mouth daily.    . folic acid (FOLVITE) 400 MCG tablet Take 400 mcg by mouth daily.    Marland Kitchen levothyroxine (SYNTHROID, LEVOTHROID) 100 MCG tablet Take 100 mcg by mouth daily.    Marland Kitchen liothyronine (CYTOMEL) 5 MCG tablet Take 5 mcg by mouth daily.    . Omega-3 Fatty Acids (FISH OIL) 1000 MG CAPS Take 1 capsule by mouth 2 (two) times daily.    . pravastatin (PRAVACHOL) 40 MG tablet Take 40 mg by mouth daily.    . Quercetin 250 MG TABS Take 1 tablet by mouth as needed.     . ranitidine (ZANTAC) 150 MG tablet Take 150 mg by mouth as needed for heartburn.    . SUMAtriptan (IMITREX) 25 MG tablet Take 25 mg by mouth every 2 (two) hours as needed.     . vitamin B-12 (CYANOCOBALAMIN) 1000 MCG tablet Take 1,000 mcg by mouth daily.     No current facility-administered medications for this visit.   Allergies:  Ciprofloxacin   Social History: The patient  reports that she has never smoked. She has never used smokeless tobacco. She reports that she does not drink alcohol or use illicit drugs.   ROS:  Please see the history of present illness. Otherwise, complete review of systems is positive for episodic bronchitis, she is concerned about possible allergy symptoms. All other systems are reviewed and negative.  Physical Exam: VS:  BP 112/68 mmHg  Pulse 56  Ht  (1.626 m)  Wt 169 lb 3.2 oz (76.749 kg)  BMI 29.03 kg/m2  SpO2 98%, BMI Body mass index is 29.03 kg/(m^2).  Wt Readings from Last 3 Encounters:  07/06/15 169 lb 3.2 oz (76.749 kg)  07/14/14 169 lb (76.658 kg)  07/14/13 169 lb (76.658 kg)    Well-nourished woman in no acute distress.  HEENT: Conjunctiva and lids normal, oropharynx with moist mucosa.  Neck: Supple, no elevated JVP or carotid bruits, no thyromegaly.  Lungs: Clear to auscultation, nonlabored.  Cardiac: Regular rate and rhythm, no significant systolic murmur, no S3.  Abdomen: Soft, nontender. Skin: Warm and dry.  Extremities: No pitting  edema, distal pulses full.   ECG:  I personally reviewed the prior tracing from 07/14/2014 which showed normal sinus rhythm with leftward axis and decreased R wave progression.  Recent Labwork:  January 2016: LDL 134, triglycerides 240, cholesterol 236, HDL 54 , hemoglobin 15, platelets 300 , BUN 11, creatinine 0.8, AST 14, ALT 11 , TSH 4. 49 , CRP 3.0 November 2016: LDL 131, HDL 58, triglycerides 262, cholesterol 241  Other Studies Reviewed Today:  Echocardiogram 07/21/2014: Study Conclusions  - Left ventricle: The cavity size was normal. Wall thickness was normal. The estimated ejection fraction was 60%. Wall motion was normal; there were no regional wall motion abnormalities. - Left atrium: The atrium was mildly dilated. - Right ventricle: The cavity size was normal. Systolic function was normal.  Chest x-ray 07/14/2014 [facility]): No infiltrates or effusion. Mild aortic calcification. Compression deformity noted in the upper thoracic spine at approximately T5 level.  Cardiac catheterization 03/31/2006 [facility]): LAD is large vessel proximally, tapering to a small caliber distally, giving off several small to medium-sized diagonal branches, 20-30% stenosis within the midportion of the LAD. Left circumflex is nondominant with several large obtuse marginals, no obstructive disease. RCA is dominant with PDA, and no significant obstructive CAD. LVEF estimated at 55% with no mitral regurgitation.  Assessment and Plan:  1. History of mild, nonobstructive coronary atherosclerosis as outlined above. ECG reviewed and stable. No clear-cut angina symptoms with typical activities. Would recommend continued observation and risk factor modification. Baby aspirin daily, continue on statin.  2. Hyperlipidemia, now on Pravachol 40 mg daily. LDL 131 back in November 2016.  Current medicines were reviewed with the patient today.   Orders Placed This Encounter  Procedures  . EKG  12-Lead    Disposition: FU with me in 1 year.   Signed, Jonelle Sidle, MD, [facility] 07/06/2015 11:01 AM    [facility] [address] [place], [place], [place] [address] Phone: [phone]; Fax: [phone]

## 2015-08-23 NOTE — Telephone Encounter (Signed)
Patient called wanting to know if Dr.Pang wants her to have her lipids/blood work checked again since she has been on Pravachol.If so she will be in [place] on Friday.    Also she is still coughing and having tightness in her chest.She was recently at her daughter's home and the cough wasn't as bad. She had her basement tested for radon and it tested at a level 4.She had a mitigation system put in and now it registers at level 1. She wants to know if Dr.Pang wants to order a chest ct or a complete pulmonary work up because of her exposure.Please call patient back at [phone]

## 2015-08-24 NOTE — Telephone Encounter (Signed)
Yes she would need an office visit but I will order for her to get labs tomorrow.  She can see a NP about this as I am not here tomorrow.    The labs are already ordered.

## 2015-08-24 NOTE — Telephone Encounter (Signed)
-----   Message from Renea EeShanea Wright sent at 08/24/2015  4:16 PM EDT -----  Regarding: Dr. Rosealee AlbeePang/Telephone  Pt is returning a call back from the office. Pt number [phone]

## 2015-08-24 NOTE — Telephone Encounter (Signed)
08/24/2015  4:22 PM      This message was sent to office  Diabetes & Endo    Please see message below from answering service.      Thanks

## 2015-08-24 NOTE — Telephone Encounter (Signed)
Attempted to contact patient without success. Did not leave detailed message due to generic voicemail greeting  Left voicemail requesting a call back to the office

## 2015-08-25 ENCOUNTER — Encounter: Payer: Self-pay | Admitting: Cardiology

## 2015-08-25 ENCOUNTER — Encounter: Attending: Internal Medicine | Primary: Internal Medicine

## 2015-08-25 ENCOUNTER — Inpatient Hospital Stay: Admit: 2015-09-19 | Payer: MEDICARE | Primary: Internal Medicine

## 2015-08-25 DIAGNOSIS — J4530 Mild persistent asthma, uncomplicated: Secondary | ICD-10-CM

## 2015-08-28 LAB — CVD REPORT

## 2015-08-28 LAB — LIPID PANEL
Cholesterol, total: 211 mg/dL — ABNORMAL HIGH (ref 100–199)
HDL Cholesterol: 54 mg/dL (ref >39)
LDL, calculated: 120 mg/dL — ABNORMAL HIGH (ref 0–99)
Triglyceride: 187 mg/dL — ABNORMAL HIGH (ref 0–149)
VLDL, calculated: 37 mg/dL (ref 5–40)

## 2015-08-28 LAB — CBC WITH AUTOMATED DIFF
ABS. BASOPHILS: 0 10*3/uL (ref 0.0–0.2)
ABS. EOSINOPHILS: 0.1 10*3/uL (ref 0.0–0.4)
ABS. IMM. GRANS.: 0 10*3/uL (ref 0.0–0.1)
ABS. MONOCYTES: 0.7 10*3/uL (ref 0.1–0.9)
ABS. NEUTROPHILS: 3.6 10*3/uL (ref 1.4–7.0)
Abs Lymphocytes: 1.8 10*3/uL (ref 0.7–3.1)
BASOPHILS: 0 %
EOSINOPHILS: 1 %
HCT: 43.3 % (ref 34.0–46.6)
HGB: 14.6 g/dL (ref 11.1–15.9)
IMMATURE GRANULOCYTES: 0 %
Lymphocytes: 29 %
MCH: 31 pg (ref 26.6–33.0)
MCHC: 33.7 g/dL (ref 31.5–35.7)
MCV: 92 fL (ref 79–97)
MONOCYTES: 11 %
NEUTROPHILS: 59 %
PLATELET: 279 10*3/uL (ref 150–379)
RBC: 4.71 x10E6/uL (ref 3.77–5.28)
RDW: 13.8 % (ref 12.3–15.4)
WBC: 6.2 10*3/uL (ref 3.4–10.8)

## 2015-08-28 LAB — METABOLIC PANEL, COMPREHENSIVE
A-G Ratio: 1.6 (ref 1.2–2.2)
ALT (SGPT): 16 IU/L (ref 0–32)
AST (SGOT): 21 IU/L (ref 0–40)
Albumin: 4.2 g/dL (ref 3.5–4.8)
Alk. phosphatase: 68 IU/L (ref 39–117)
BUN/Creatinine ratio: 13 (ref 12–28)
BUN: 10 mg/dL (ref 8–27)
Bilirubin, total: 0.4 mg/dL (ref 0.0–1.2)
CO2: 25 mmol/L (ref 18–29)
Calcium: 9.6 mg/dL (ref 8.7–10.3)
Chloride: 98 mmol/L (ref 96–106)
Creatinine: 0.77 mg/dL (ref 0.57–1.00)
GFR est AA: 87 mL/min/{1.73_m2} (ref >59)
GFR est non-AA: 75 mL/min/{1.73_m2} (ref >59)
GLOBULIN, TOTAL: 2.7 g/dL (ref 1.5–4.5)
Glucose: 95 mg/dL (ref 65–99)
Potassium: 4.4 mmol/L (ref 3.5–5.2)
Protein, total: 6.9 g/dL (ref 6.0–8.5)
Sodium: 140 mmol/L (ref 134–144)

## 2015-08-28 LAB — HEMOGLOBIN A1C WITH EAG
Estimated average glucose: 114 mg/dL
Hemoglobin A1c: 5.6 % (ref 4.8–5.6)

## 2015-08-28 LAB — THYROXINE (T4): T4, Total: 8.9 ug/dL (ref 4.5–12.0)

## 2015-08-28 LAB — TSH REFLEX TO T4: TSH: 6.84 u[IU]/mL — ABNORMAL HIGH (ref 0.450–4.500)

## 2015-09-14 ENCOUNTER — Ambulatory Visit: Admit: 2015-09-14 | Discharge: 2015-09-14 | Payer: MEDICARE | Attending: Internal Medicine | Primary: Internal Medicine

## 2015-09-14 DIAGNOSIS — E039 Hypothyroidism, unspecified: Secondary | ICD-10-CM

## 2015-09-14 MED ORDER — LEVOTHYROXINE 112 MCG TAB
112 mcg | ORAL_TABLET | Freq: Every day | ORAL | 6 refills | Status: DC
Start: 2015-09-14 — End: 2017-09-30

## 2015-09-14 MED ORDER — LEVOTHYROXINE 112 MCG TAB
112 mcg | ORAL_TABLET | Freq: Every day | ORAL | 6 refills | Status: DC
Start: 2015-09-14 — End: 2015-09-14

## 2015-09-14 NOTE — Progress Notes (Signed)
Results (discuss)       HPI:  Leslie Potter is a 75 y.o. year old female who is here to establish care. She  had her medical care:     She reports the following history and medical concerns:      Taking 112 mcg - was on 100 mcg.  Didn't skipped pills.  Was normal 12/19.  Been feeling tired and constipated    Had radon detected in house and had a system to put in to draw it out of the basement and levels    Coughing less.    Doing pretty good now.  a1c 5.6    Erosive esophagitis- on omeprazole        Assessment and Plan        1. Hypothyroidism, unspecified type  Tolerating medication.  Discussion on its effectiveness and queried about side effects.  Patient agrees to stay on medication and demonstrate compliance.  No adjustments made on    - levothyroxine (SYNTHROID) 112 mcg tablet; Take 1 Tab by mouth Daily (before breakfast).  Dispense: 90 Tab; Refill: 6    2. 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  Wants PFT.  CXR in past is normal  - PULMONARY FUNCTION TEST; Future    3. SOB (shortness of breath)  With exertion only.  Hx of radon exposure.  - PULMONARY FUNCTION TEST; Future    4. Radon exposure, initial encounter  It has been removed now.  She wants baseline testing.  - PULMONARY FUNCTION TEST; Future        Visit Vitals   ??? BP 130/60 (BP 1 Location: Left arm, BP Patient Position: Sitting)   ??? Pulse 74   ??? Temp 98 ??F (36.7 ??C) (Oral)   ??? Resp 16   ??? Ht 5' 4.75" (1.645 m)   ??? Wt 168 lb 11.2 oz (76.5 kg)   ??? SpO2 98%   ??? BMI 28.29 kg/m2       Historical Data    Past Medical History:   Diagnosis Date   ??? Arthritis     hands/knees   ??? Dysthymia 05/08/2015   ??? Family history of heart attack 05/08/2015   ??? GERD (gastroesophageal reflux disease)    ??? History of cholecystectomy 05/08/2015   ??? History of prediabetes 05/08/2015   ??? History of sinus bradycardia 05/08/2015   ??? Hx of diverticulitis of colon 05/08/2015   ??? Hypothyroidism 05/08/2015   ??? Pure hypercholesterolemia 05/08/2015    ??? Thyroid disease        Past Surgical History:   Procedure Laterality Date   ??? HX CATARACT REMOVAL Bilateral 2010   ??? HX OTHER SURGICAL      colon polyp removed   ??? HX RETINAL DETACHMENT REPAIR Right 2008    tears   ??? HX TUBAL LIGATION     ??? UPPER GI ENDOSCOPY,BIOPSY  01/06/2015            Outpatient Encounter Prescriptions as of 09/14/2015   Medication Sig Dispense Refill   ??? liothyronine (CYTOMEL) 5 mcg tablet Take 1 Tab by mouth daily. 90 Tab 4   ??? levothyroxine (SYNTHROID) 100 mcg tablet Take 1 Tab by mouth Daily (before breakfast). 90 Tab 4   ??? omeprazole (PRILOSEC) 40 mg capsule Take 1 Cap by mouth daily. Indications: GASTROESOPHAGEAL REFLUX 90 Cap 6   ??? pravastatin (PRAVACHOL) 40 mg tablet Take 1 Tab by mouth nightly. 30 Tab 12   ??? docusate sodium (COLACE)  100 mg capsule Take 1 Cap by mouth two (2) times a day. May use OTC instead. Prevents constipation from narcotics. 60 Cap 0   ??? ascorbic acid (VITAMIN C) 1,000 mg tablet Take 1,000 mg by mouth daily. 2 daily     ??? calcium-magnesium-zinc Tab Take 1,000 mg by mouth daily.     ??? FLAXSEED OIL Take 1,000 mg by mouth two (2) times a day.     ??? albuterol (PROVENTIL HFA, VENTOLIN HFA, PROAIR HFA) 90 mcg/actuation inhaler Take 2 Puffs by inhalation every four (4) hours as needed for Wheezing. 1 Inhaler 0   ??? GUAIFENESIN/DEXTROMETHORPHAN (TUSSIN DM MAX PO) Take  by mouth.     ??? buPROPion SR (WELLBUTRIN SR) 150 mg SR tablet Take 150 mg by mouth daily.     ??? BENEFIBER, WHEAT DEXTRIN, PO Take  by mouth daily.       No facility-administered encounter medications on file as of 09/14/2015.         Allergies   Allergen Reactions   ??? Simvastatin Myalgia        Social History     Social History   ??? Marital status: DIVORCED     Spouse name: N/A   ??? Number of children: N/A   ??? Years of education: N/A     Occupational History   ??? Not on file.     Social History Main Topics   ??? Smoking status: Never Smoker   ??? Smokeless tobacco: Never Used   ??? Alcohol use No   ??? Drug use: No    ??? Sexual activity: No     Other Topics Concern   ??? Not on file     Social History Narrative        Review of Systems   Constitutional: Negative for chills, diaphoresis, fever, malaise/fatigue and weight loss.   HENT: Negative for hearing loss.    Respiratory: Positive for shortness of breath. Negative for cough.    Cardiovascular: Negative for chest pain and palpitations.   Gastrointestinal: Negative for blood in stool and constipation.   Genitourinary: Negative for dysuria, flank pain, frequency and urgency.   Musculoskeletal: Negative for myalgias.   Skin: Negative for rash.   Neurological: Negative for dizziness, weakness and headaches.   Endo/Heme/Allergies: Does not bruise/bleed easily.         Physical Exam   Constitutional: She appears well-developed and well-nourished. She is active.  Non-toxic appearance. She does not have a sickly appearance. She does not appear ill. No distress.   Eyes: Conjunctivae are normal.   Cardiovascular: Normal rate, S1 normal, S2 normal, normal heart sounds and normal pulses.  Exam reveals no gallop and no friction rub.    Pulmonary/Chest: Effort normal and breath sounds normal. No respiratory distress.   Abdominal: Soft. Bowel sounds are normal.   Musculoskeletal: She exhibits no edema or deformity.   Neurological: She is alert.   Skin: Skin is warm and dry. No rash noted. No pallor.   Psychiatric: She has a normal mood and affect. Her behavior is normal.   Vitals reviewed.     Ortho Exam       No orders of the defined types were placed in this encounter.       I have reviewed the patient's medical history in detail and updated the computerized patient record.     We had a prolonged discussion about these complex clinical issues and went over the various important aspects to consider. All questions  were answered.     Advised her to call back or return to office if symptoms do not improve, change in nature, or persist.     She was given an after visit summary or informed of MyChart Access which includes patient instructions, diagnoses, current medications, & vitals.  She expressed understanding with the diagnosis and plan.

## 2015-09-14 NOTE — Progress Notes (Signed)
Chief Complaint   Patient presents with   ??? Results     discuss     Reviewed record in preparation for visit and have obtained necessary documentation.    Identified pt with two pt identifiers(name and DOB).      Health Maintenance Due   Topic   ??? DTaP/Tdap/Td series (1 - Tdap)   ??? GLAUCOMA SCREENING Q2Y    ??? Pneumococcal 65+ Low/Medium Risk (2 of 2 - PPSV23)         Chief Complaint   Patient presents with   ??? Results     discuss        Wt Readings from Last 3 Encounters:   09/14/15 168 lb 11.2 oz (76.5 kg)   06/19/15 167 lb 12.8 oz (76.1 kg)   05/08/15 167 lb (75.8 kg)     Temp Readings from Last 3 Encounters:   09/14/15 98 ??F (36.7 ??C) (Oral)   06/19/15 96.3 ??F (35.7 ??C) (Oral)   05/08/15 97.9 ??F (36.6 ??C) (Oral)     BP Readings from Last 3 Encounters:   09/14/15 130/60   06/19/15 132/80   05/08/15 108/64     Pulse Readings from Last 3 Encounters:   09/14/15 74   06/19/15 62   05/08/15 71           Learning Assessment:  :     No flowsheet data found.    Depression Screening:  :     No flowsheet data found.    Fall Risk Assessment:  :     Fall Risk Assessment, last 12 mths 06/19/2015   Able to walk? Yes   Fall in past 12 months? No       Abuse Screening:  :     No flowsheet data found.    Coordination of Care Questionnaire:  :     1) Have you been to an emergency room, urgent care clinic since your last visit? no   Hospitalized since your last visit? no             2) Have you seen or consulted any other health care providers outside of [facility] since your last visit? no  (Include any pap smears or colon screenings in this section.)    3) Do you have an Advance Directive on file? no    4) Are you interested in receiving information on Advance Directives? NO      Patient is accompanied by self I have received verbal consent from Adrienne Mochaarnell H Choplin to discuss any/all medical information while they are present in the room.    Reviewed record  In preparation for visit and have obtained necessary  documentation.

## 2015-09-18 ENCOUNTER — Encounter: Payer: Self-pay | Admitting: Cardiology

## 2015-09-18 ENCOUNTER — Inpatient Hospital Stay: Admit: 2015-09-18 | Payer: MEDICARE | Attending: Internal Medicine | Primary: Internal Medicine

## 2015-09-18 DIAGNOSIS — R05 Cough: Secondary | ICD-10-CM

## 2015-09-18 LAB — PULMONARY FUNCTION TEST

## 2015-09-18 MED ORDER — ALBUTEROL SULFATE 0.083 % (0.83 MG/ML) SOLN FOR INHALATION
2.5 mg /3 mL (0.083 %) | Freq: Once | RESPIRATORY_TRACT | Status: AC
Start: 2015-09-18 — End: 2015-09-18
  Administered 2015-09-18: 18:00:00 via RESPIRATORY_TRACT

## 2015-09-18 MED FILL — ALBUTEROL SULFATE 0.083 % (0.83 MG/ML) SOLN FOR INHALATION: 2.5 mg /3 mL (0.083 %) | RESPIRATORY_TRACT | Qty: 1

## 2015-09-18 NOTE — Procedures (Signed)
[facility] [facility]   [address]   [place], [place] [id number]   SPIROMETRY       Name:  Leslie FreestoneCOCKRAM, [name]   MR#:  [id number]   DOB:  December 24, 1940   Account #:  [id number]    Date of Procedure:  09/18/2015   Date of Adm:  09/18/2015       CLINICAL DIAGNOSIS/INDICATIONS:  Shortness of breath. Has history   of radon exposure.     REFERRING PROVIDER: Juline Patchichard Pang, MD.     INTERPRETATION:  The clinical spirometry suggests obstruction at   the FEV1/FVC ratios reduced at 66%; however, the FVC and FEV1   were both within normal limits individually. 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 There is excellent reversibility with bronchodilators in the   distal airways noted, while there is no significant reversibility with   bronchodilators noted in the large airways. The DLCO is normal. The   flow volume loops are consistent with evidence of mild obstruction.   Clinical correlation is advised.         Sharmon RevereSUNIL Britteney Ayotte, MD      SR / Gustavo.[email or web address]   D:  09/21/2015   12:57   T:  09/21/2015   13:29   Job #:  [id number]

## 2015-09-18 NOTE — Progress Notes (Signed)
Patient informed there is some lung issues with her PFT and she may benefit from using an inhaler when  She gets SOB. Patient was told she can discuss in next office visit.  [place] in [place] and next appointment is in October.  Patient states she will be in town next week.  She was scheduled for Oct 06, 2015.

## 2015-09-18 NOTE — Procedures (Signed)
[facility] [facility]   [address] [place] [address]   SPIROMETRY       Name:  Bracewell, Tajha H   MR#:  [id number]   DOB:  10/26/1940   Account #:  [id number]    Date of Procedure:  09/18/2015   Date of Adm:  09/18/2015       CLINICAL DIAGNOSIS/INDICATIONS:  Shortness of breath. Has history   of radon exposure.     REFERRING PROVIDER: Richard Pang, MD.     INTERPRETATION:  The clinical spirometry suggests obstruction at   the FEV1/FVC ratios reduced at 66%; however, the FVC and FEV1   were both within normal limits individually. 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 There is excellent reversibility with bronchodilators in the   distal airways noted, while there is no significant reversibility with   bronchodilators noted in the large airways. The DLCO is normal. The   flow volume loops are consistent with evidence of mild obstruction.   Clinical correlation is advised.         Kaydynce Pat, MD      SR / JZ   D:  09/21/2015   12:57   T:  09/21/2015   13:29   Job #:  [id number]

## 2015-09-18 NOTE — Progress Notes (Signed)
Inform patient there is some lung issues with her pulmonary function test and that she may benefit from using an inhaler when she gets SOB.  When she comes back, we should talk in a visit about this  Signed electronically by: Sonia Sideichard Y Kayshaun Polanco, MD at 9:52 PM on Sep 23, 2015

## 2015-10-06 ENCOUNTER — Ambulatory Visit: Admit: 2015-10-06 | Discharge: 2015-10-06 | Payer: MEDICARE | Attending: Internal Medicine | Primary: Internal Medicine

## 2015-10-06 ENCOUNTER — Inpatient Hospital Stay: Admit: 2015-10-17 | Payer: MEDICARE | Primary: Internal Medicine

## 2015-10-06 DIAGNOSIS — E039 Hypothyroidism, unspecified: Secondary | ICD-10-CM

## 2015-10-06 DIAGNOSIS — R0602 Shortness of breath: Secondary | ICD-10-CM

## 2015-10-06 MED ORDER — MOMETASONE 220 MCG (120 DOSES) BREATH ACTIVATED POWDER AEROSOL
220 mcg/ actuation (120) | ORAL_CAPSULE | Freq: Two times a day (BID) | RESPIRATORY_TRACT | 3 refills | Status: DC
Start: 2015-10-06 — End: 2015-10-10

## 2015-10-06 NOTE — Progress Notes (Signed)
Chief Complaint   Patient presents with   ??? Other     Pulmonary function test     1. Have you been to the ER, urgent care clinic since your last visit? No  Hospitalized since your last visit?No    2. Have you seen or consulted any other health care providers outside of the [facility] since your last visit? No  Include any pap smears or colon screening. No

## 2015-10-06 NOTE — Progress Notes (Signed)
Other (Pulmonary function test)       HPI:  Leslie Potter is a 75 y.o. year old female who is here for a follow up visit.  She was last seen by me on 09/14/2015.   She reports the following:    Cough is better    Sob and tightness worse after activity.    Radon exposure.  Now removed from the house.    PFT- showed small airway improvement with nebulizer    Daughter who is NP requests she has a CT as she still has SOB at exertion.    I did ask patient to see a cardiologist she goes to in [place] to discuss the SOB with exertion.        Assessment and Plan        1. SOB (shortness of breath)  Daughter would like to request CT.  Ok to do considering she is not better.  This does not appear like a PE but to rule out possible scarring or other radon exposure.    - CT CHEST WO CONT; Future    2. Small airways disease  Will have patient start asmanex bid to see if it would help.  Use albuterol prior to exercise.  - CT CHEST WO CONT; Future    3. 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  Will do CT at daughter's request.  If no relief will consider pulm consult.  Pt will get cardiac workup in [place].  - CT CHEST WO CONT; Future    4. Hypothyroidism, unspecified type  Repeat TSH since adjustment  - TSH REFLEX TO T4        Visit Vitals   ??? BP 120/62 (BP 1 Location: Left arm, BP Patient Position: Sitting)   ??? Pulse 67   ??? Temp 96.7 ??F (35.9 ??C) (Oral)   ??? Resp 18   ??? Ht 5\' 4"  (1.626 m)   ??? Wt 168 lb (76.2 kg)   ??? SpO2 98%   ??? BMI 28.84 kg/m2       Historical Data    Past Medical History:   Diagnosis Date   ??? Arthritis     hands/knees   ??? Dysthymia 05/08/2015   ??? Family history of heart attack 05/08/2015   ??? GERD (gastroesophageal reflux disease)    ??? History of cholecystectomy 05/08/2015   ??? History of prediabetes 05/08/2015   ??? History of sinus bradycardia 05/08/2015   ??? Hx of diverticulitis of colon 05/08/2015   ??? Hypothyroidism 05/08/2015   ??? Pure hypercholesterolemia 05/08/2015   ??? Thyroid disease        Past Surgical History:    Procedure Laterality Date   ??? HX CATARACT REMOVAL Bilateral 2010   ??? HX OTHER SURGICAL      colon polyp removed   ??? HX RETINAL DETACHMENT REPAIR Right 2008    tears   ??? HX TUBAL LIGATION     ??? UPPER GI ENDOSCOPY,BIOPSY  01/06/2015            Outpatient Encounter Prescriptions as of 10/06/2015   Medication Sig Dispense Refill   ??? mometasone (ASMANEX TWISTHALER) 220 mcg (120 doses) aepb inhaler Take 1 Puff by inhalation two (2) times a day. 120 Cap 3   ??? levothyroxine (SYNTHROID) 112 mcg tablet Take 1 Tab by mouth Daily (before breakfast). 90 Tab 6   ??? liothyronine (CYTOMEL) 5 mcg tablet Take 1 Tab by mouth daily. 90 Tab 4   ??? omeprazole (PRILOSEC)  40 mg capsule Take 1 Cap by mouth daily. Indications: GASTROESOPHAGEAL REFLUX 90 Cap 6   ??? albuterol (PROVENTIL HFA, VENTOLIN HFA, PROAIR HFA) 90 mcg/actuation inhaler Take 2 Puffs by inhalation every four (4) hours as needed for Wheezing. 1 Inhaler 0   ??? pravastatin (PRAVACHOL) 40 mg tablet Take 1 Tab by mouth nightly. 30 Tab 12   ??? docusate sodium (COLACE) 100 mg capsule Take 1 Cap by mouth two (2) times a day. May use OTC instead. Prevents constipation from narcotics. 60 Cap 0   ??? BENEFIBER, WHEAT DEXTRIN, PO Take  by mouth daily.     ??? ascorbic acid (VITAMIN C) 1,000 mg tablet Take 1,000 mg by mouth daily. 2 daily     ??? calcium-magnesium-zinc Tab Take 1,000 mg by mouth daily.     ??? FLAXSEED OIL Take 1,000 mg by mouth two (2) times a day.     ??? GUAIFENESIN/DEXTROMETHORPHAN (TUSSIN DM MAX PO) Take  by mouth.       No facility-administered encounter medications on file as of 10/06/2015.         Allergies   Allergen Reactions   ??? Simvastatin Myalgia        Social History     Social History   ??? Marital status: DIVORCED     Spouse name: N/A   ??? Number of children: N/A   ??? Years of education: N/A     Occupational History   ??? Not on file.     Social History Main Topics   ??? Smoking status: Never Smoker   ??? Smokeless tobacco: Never Used   ??? Alcohol use No   ??? Drug use: No    ??? Sexual activity: No     Other Topics Concern   ??? Not on file     Social History Narrative        Review of Systems   Constitutional: Negative for weight loss.   Eyes: Negative for blurred vision.   Respiratory: Negative for shortness of breath.    Cardiovascular: Negative for chest pain.   Gastrointestinal: Negative for abdominal pain.   Genitourinary: Negative for dysuria and frequency.   Skin: Negative for rash.   Neurological: Negative for dizziness, weakness and headaches.          Physical Exam   Constitutional: No distress.   Eyes: Conjunctivae are normal.   Skin: Skin is warm and dry.   Psychiatric: She has a normal mood and affect. Her behavior is normal.   Vitals reviewed.     Ortho Exam      Orders Placed This Encounter   ??? CT CHEST WO CONT     Standing Status:   Future     Standing Expiration Date:   11/05/2016     Order Specific Question:   Is Patient Allergic to Contrast Dye?     Answer:   Unknown   ??? TSH REFLEX TO T4   ??? mometasone (ASMANEX TWISTHALER) 220 mcg (120 doses) aepb inhaler     Sig: Take 1 Puff by inhalation two (2) times a day.     Dispense:  120 Cap     Refill:  3        I have reviewed the patient's medical history in detail and updated the computerized patient record.     We had a prolonged discussion about these complex clinical issues and went over the various important aspects to consider. All questions were answered.     Advised  her to call back or return to office if symptoms do not improve, change in nature, or persist.    She was given an after visit summary or informed of MyChart Access which includes patient instructions, diagnoses, current medications, & vitals.  She expressed understanding with the diagnosis and plan.

## 2015-10-06 NOTE — Telephone Encounter (Signed)
Attempted to contact patient without success.     Patient contacted via my chart by provider

## 2015-10-06 NOTE — Telephone Encounter (Signed)
PAtient was seen this morning and Dr.Pang prescribed an inhaler that is no covered bu the patient's insurance.[facility] told her daughter that they faxed something to us with the alternatives but I could not find any fax from them.Please call daughter back at [phone]

## 2015-10-07 LAB — TSH REFLEX TO T4: TSH: 3.19 u[IU]/mL (ref 0.450–4.500)

## 2015-10-09 ENCOUNTER — Ambulatory Visit

## 2015-10-09 ENCOUNTER — Encounter: Payer: Self-pay | Admitting: Cardiology

## 2015-10-09 ENCOUNTER — Inpatient Hospital Stay: Admit: 2015-10-09 | Payer: MEDICARE | Attending: Internal Medicine | Primary: Internal Medicine

## 2015-10-09 DIAGNOSIS — R0602 Shortness of breath: Secondary | ICD-10-CM

## 2015-10-09 NOTE — Progress Notes (Signed)
Your thyroid is normal now.  Message sent to patient via mychart:  Oct 09, 2015

## 2015-10-10 MED ORDER — FLUTICASONE 220 MCG/ACTUATION AEROSOL INHALER
220 mcg/actuation | Freq: Two times a day (BID) | RESPIRATORY_TRACT | 3 refills | Status: DC
Start: 2015-10-10 — End: 2016-12-09

## 2015-10-10 NOTE — Telephone Encounter (Signed)
flovent sent in for patient.  Please let her know

## 2015-10-10 NOTE — Telephone Encounter (Signed)
Spoke with Pharmacist Nelda,      Asmanex  Requires a PA . Pharmacist voiced that insurance would like pt to try Flovent diskus or Flovent HFA prior to trying Asmanex. Will route medication change request to MD for recommendation. Then make changes as prompted.     Spoke with Patient , made her aware of issuie. She verbalized understanding. She also voiced she has not taken Flovent in the past and willing to try if MD agrees.

## 2015-10-10 NOTE — Progress Notes (Signed)
They didn't find anything on the CT.  Incidental nodule on CT but very small.  Let me know how the inhaler does.  I called in another one since insurance wouldn't pay for the [facility]asmanex  Message sent to patient via mychart:  Oct 10, 2015

## 2015-11-24 ENCOUNTER — Ambulatory Visit: Payer: Medicare Other | Admitting: Cardiology

## 2016-01-01 ENCOUNTER — Encounter: Payer: Self-pay | Admitting: Cardiology

## 2016-01-01 ENCOUNTER — Encounter: Payer: Self-pay | Admitting: *Deleted

## 2016-01-01 ENCOUNTER — Ambulatory Visit (INDEPENDENT_AMBULATORY_CARE_PROVIDER_SITE_OTHER): Payer: Medicare Other | Admitting: Cardiology

## 2016-01-01 VITALS — BP 118/78 | HR 68 | Ht 64.0 in | Wt 174.0 lb

## 2016-01-01 DIAGNOSIS — I25119 Atherosclerotic heart disease of native coronary artery with unspecified angina pectoris: Secondary | ICD-10-CM | POA: Diagnosis not present

## 2016-01-01 DIAGNOSIS — Z87898 Personal history of other specified conditions: Secondary | ICD-10-CM

## 2016-01-01 DIAGNOSIS — E782 Mixed hyperlipidemia: Secondary | ICD-10-CM | POA: Diagnosis not present

## 2016-01-01 DIAGNOSIS — I2510 Atherosclerotic heart disease of native coronary artery without angina pectoris: Secondary | ICD-10-CM | POA: Diagnosis not present

## 2016-01-01 DIAGNOSIS — R0600 Dyspnea, unspecified: Secondary | ICD-10-CM

## 2016-01-01 DIAGNOSIS — R0609 Other forms of dyspnea: Secondary | ICD-10-CM | POA: Diagnosis not present

## 2016-01-01 DIAGNOSIS — R42 Dizziness and giddiness: Secondary | ICD-10-CM | POA: Diagnosis not present

## 2016-01-01 DIAGNOSIS — Z8679 Personal history of other diseases of the circulatory system: Secondary | ICD-10-CM

## 2016-01-01 NOTE — Addendum Note (Signed)
Addended by: Eustace MooreANDERSON, Yalda Herd M on: 01/01/2016 03:37 PM   Modules accepted: Orders

## 2016-01-01 NOTE — Progress Notes (Signed)
Cardiology Office Note  Date: 01/01/2016   ID: Yehudis Monceaux, DOB [birth year], MRN [id number]  PCP: Juline Patch, MD. Primary Cardiologist: Nona Dell, MD   Chief Complaint  Patient presents with  . Dizziness  . Shortness of Breath    History of Present Illness: Carmelle Bamberg is a 75 y.o. female last seen in February. She is referred back to the office by Dr. Ricki Miller for evaluation of intermittent dizziness and also shortness of breath. She has undergone recent testing including PFTs that demonstrated severe distal small airways obstruction with excellent reversibility in terms of bronchodilator response. DLCO was normal. She is now on Flovent and albuterol, has had some improvement in dyspnea, but still feels exertional symptoms at times such as when she is outdoors doing yardwork. She has no chest pain with exertion.  Dizziness sounds to be orthostatic in description, sometimes if she moves her head up quickly from lying down. She describes vertigo in some sense, but not entirely. Orthostatic measurements done in clinic today were normal. These symptoms have not been consistent.  She underwent a cardiac catheterization approximately 10 years ago that demonstrated only mild atherosclerosis. No recent interval ischemic testing. Echocardiogram from last year showed normal LVEF.  Past Medical History:  Diagnosis Date  . Arthritis   . Bradycardia   . Cataracts, bilateral   . Cholelithiasis   . Coronary atherosclerosis of native coronary artery    Mild atherosclerosis  . Depression   . GERD (gastroesophageal reflux disease)   . History of positive PPD   . Hx of migraines   . Hyperlipidemia   . Hypothyroidism   . Osteopenia     Past Surgical History:  Procedure Laterality Date  . Bilateral tubal ligation    . Cataract surgery    . Colonic polyp resection      Current Outpatient Prescriptions  Medication Sig Dispense Refill  . albuterol (PROVENTIL HFA;VENTOLIN HFA)  108 (90 Base) MCG/ACT inhaler Inhale into the lungs every 6 (six) hours as needed for wheezing or shortness of breath.    . Ascorbic Acid (VITAMIN C ER PO) Take 4 tablets by mouth 2 (two) times daily.    . Calcium in Bone Mineral Cmplx (OSTIGEN) 350 MG MISC Take 2 each by mouth 2 (two) times daily.    . Calcium-Magnesium 500-250 MG TABS Take 1 tablet by mouth daily.     Marland Kitchen CHERATUSSIN DAC 30-10-100 MG/5ML solution Take 5 mLs by mouth at bedtime as needed.     . Cholecalciferol (VITAMIN D3) 5000 UNITS CAPS Take 1 capsule by mouth daily.    . fexofenadine (ALLEGRA) 180 MG tablet Take 180 mg by mouth daily.    . fluticasone (FLOVENT HFA) 220 MCG/ACT inhaler Inhale 1 puff into the lungs 2 (two) times daily.    . folic acid (FOLVITE) 400 MCG tablet Take 400 mcg by mouth daily.    Marland Kitchen levothyroxine (SYNTHROID, LEVOTHROID) 100 MCG tablet Take 125 mcg by mouth daily.     Marland Kitchen liothyronine (CYTOMEL) 5 MCG tablet Take 5 mcg by mouth daily.    . Omega-3 Fatty Acids (FISH OIL) 1000 MG CAPS Take 1 capsule by mouth 2 (two) times daily.    . pravastatin (PRAVACHOL) 40 MG tablet Take 40 mg by mouth daily.    . Quercetin 250 MG TABS Take 1 tablet by mouth as needed.     . ranitidine (ZANTAC) 150 MG tablet Take 150 mg by mouth as needed for heartburn.    Marland Kitchen  SUMAtriptan (IMITREX) 25 MG tablet Take 25 mg by mouth every 2 (two) hours as needed.     . vitamin B-12 (CYANOCOBALAMIN) 1000 MCG tablet Take 1,000 mcg by mouth daily.     No current facility-administered medications for this visit.    Allergies:  Ciprofloxacin   Social History: The patient  reports that she has never smoked. She has never used smokeless tobacco. She reports that she does not drink alcohol or use drugs.   ROS:  Please see the history of present illness. Otherwise, complete review of systems is positive for arthritic symptoms.  All other systems are reviewed and negative.   Physical Exam: VS:  BP 118/78   Pulse 68   Ht 5\' 4"  (1.626 m)   Wt  174 lb (78.9 kg)   SpO2 99%   BMI 29.87 kg/m , BMI Body mass index is 29.87 kg/m.  Wt Readings from Last 3 Encounters:  01/01/16 174 lb (78.9 kg)  07/06/15 169 lb 3.2 oz (76.7 kg)  07/14/14 169 lb (76.7 kg)    Well-nourished woman in no acute distress.  HEENT: Conjunctiva and lids normal, oropharynx with moist mucosa.  Neck: Supple, no elevated JVP or carotid bruits, no thyromegaly.  Lungs: Clear to auscultation, nonlabored.  Cardiac: Regular rate and rhythm, no significant systolic murmur, no S3.  Abdomen: Soft, nontender. Skin: Warm and dry.  Extremities: No pitting edema, distal pulses full.  ECG: I personally reviewed the tracing from 07/06/2015 which showed sinus bradycardia with leftward axis.  Recent Labwork:  January 2016: LDL 134, total cholesterol 236, triglycerides 240, HDL 42, hemoglobin 15.0, platelets 300, BUN 11, creatinine 0.8, potassium 4.6, AST 14, ALT 11, TSH 4.49 April 2017: Cholesterol 211, triglycerides 187, HDL 54, LDL 120  Other Studies Reviewed Today:  Echocardiogram 07/21/2014: Study Conclusions  - Left ventricle: The cavity size was normal. Wall thickness was normal. The estimated ejection fraction was 60%. Wall motion was normal; there were no regional wall motion abnormalities. - Left atrium: The atrium was mildly dilated. - Right ventricle: The cavity size was normal. Systolic function was normal.  Cardiac catheterization 03/31/2006 [facility] LAD is large vessel proximally, tapering to a small caliber distally, giving off several small to medium-sized diagonal branches, 20-30% stenosis within the midportion of the LAD. Left circumflex is nondominant with several large obtuse marginals, no obstructive disease. RCA is dominant with PDA, and no significant obstructive CAD. LVEF estimated at 55% with no mitral regurgitation.  PFTs 09/18/2015: Severe obstructive defect distal small airways with excellent bronchodilator response,  normal DLCO. Overall flow loop consistent with mild obstruction.  Chest CT 10/09/2015: Small, subpleural nodules measuring up to 4 mm in the right lateral costophrenic angle and a small right lower lobe calcified granuloma visit. Minimal bands of linear atelectasis/scar in both lung bases.  Assessment and Plan:  1. Dyspnea on exertion. Possibly multifactorial. PFTs were abnormal as outlined above, she is now on Flovent and albuterol. She has not undergone recent ischemic testing with previous history of mild coronary atherosclerosis as of cardiac catheterization 10 years ago. We will plan a Lexiscan Myoview for repeat ischemic evaluation. LVEF was normal range by echocardiogram last year.  2. Intermittent orthostatic dizziness. Etiology is not entirely clear, but she is not orthostatic by measurements today. Some of the symptoms sound more like vertigo, possibly BPV.  3. Hyperlipidemia, currently on Pravachol and omega-3 supplements. Recent lipid panel noted above.  4. History of bradycardia, heart rate is normal today however. Not  entirely clear that this is symptom provoking. Could possibly consider further cardiac monitoring although her episodes of dizziness do not necessarily sound arrhythmogenic.  Current medicines were reviewed with the patient today.  Disposition: Follow-up with me in one month.  Signed, Jonelle SidleSamuel G. Eythan Jayne, MD, [facility] 01/01/2016 3:00 PM    [facility] [address], [place], [place] [address] Phone: [phone]; Fax: [phone]

## 2016-01-01 NOTE — Patient Instructions (Addendum)
Medication Instructions:   Your physician recommends that you continue on your current medications as directed. Please refer to the Current Medication list given to you today.  Labwork:  NONE  Testing/Procedures: Your physician has requested that you have a lexiscan myoview. For further information please visit [email or web address]. Please follow instruction sheet, as given.  Follow-Up:  Your physician recommends that you schedule a follow-up appointment in: 1 month.  Any Other Special Instructions Will Be Listed Below (If Applicable).  If you need a refill on your cardiac medications before your next appointment, please call your pharmacy. 

## 2016-01-02 ENCOUNTER — Encounter (HOSPITAL_COMMUNITY)
Admission: RE | Admit: 2016-01-02 | Discharge: 2016-01-02 | Disposition: A | Discharge status: Home / Self Care | Payer: Medicare Other | Source: Ambulatory Visit | Attending: Cardiology | Admitting: Cardiology

## 2016-01-02 ENCOUNTER — Encounter (HOSPITAL_COMMUNITY): Payer: Self-pay

## 2016-01-02 ENCOUNTER — Ambulatory Visit (HOSPITAL_COMMUNITY)
Admission: RE | Admit: 2016-01-02 | Discharge: 2016-01-02 | Disposition: A | Discharge status: Home / Self Care | Payer: Medicare Other | Source: Ambulatory Visit | Attending: Cardiology | Admitting: Cardiology

## 2016-01-02 DIAGNOSIS — R0600 Dyspnea, unspecified: Secondary | ICD-10-CM

## 2016-01-02 DIAGNOSIS — R0609 Other forms of dyspnea: Secondary | ICD-10-CM | POA: Insufficient documentation

## 2016-01-02 DIAGNOSIS — I25119 Atherosclerotic heart disease of native coronary artery with unspecified angina pectoris: Secondary | ICD-10-CM | POA: Diagnosis not present

## 2016-01-02 HISTORY — DX: Unspecified asthma, uncomplicated: J45.909

## 2016-01-02 LAB — NM MYOCAR MULTI W/SPECT W/WALL MOTION / EF
CHL CUP NUCLEAR SRS: 0
CHL CUP NUCLEAR SSS: 2
LHR: 0.25
LV sys vol: 23 mL
LVDIAVOL: 57 mL (ref 46–106)
Peak HR: 80 {beats}/min
Rest HR: 51 {beats}/min
SDS: 2
TID: 0.89

## 2016-01-02 MED ORDER — TECHNETIUM TC 99M TETROFOSMIN IV KIT
10.0000 | PACK | Freq: Once | INTRAVENOUS | Status: AC | PRN
  Administered 2016-01-02: 10.8 via INTRAVENOUS

## 2016-01-02 MED ORDER — TECHNETIUM TC 99M TETROFOSMIN IV KIT
30.0000 | PACK | Freq: Once | INTRAVENOUS | Status: AC | PRN
  Administered 2016-01-02: 30.2 via INTRAVENOUS

## 2016-01-02 MED ORDER — REGADENOSON 0.4 MG/5ML IV SOLN
INTRAVENOUS | Status: AC
  Administered 2016-01-02: 0.4 mg via INTRAVENOUS
  Filled 2016-01-02: qty 5

## 2016-01-02 MED ORDER — SODIUM CHLORIDE 0.9% FLUSH
INTRAVENOUS | Status: AC
  Administered 2016-01-02: 10 mL via INTRAVENOUS
  Filled 2016-01-02: qty 10

## 2016-01-04 ENCOUNTER — Encounter: Payer: Self-pay | Admitting: *Deleted

## 2016-01-04 ENCOUNTER — Telehealth: Payer: Self-pay | Admitting: *Deleted

## 2016-01-04 NOTE — Telephone Encounter (Signed)
-----   Message from Jonelle SidleSamuel G McDowell, MD sent at 01/03/2016  7:42 AM EDT ----- Results reviewed. Please let her know that the stress test was reassuring, no evidence of the development of obstructive CAD to explain symptoms and LVEF is normal range. A copy of this test should be forwarded to PROVIDER NOT IN SYSTEM.

## 2016-01-04 NOTE — Telephone Encounter (Signed)
Patient informed and copy sent to PCP. 

## 2016-01-17 ENCOUNTER — Encounter

## 2016-01-17 ENCOUNTER — Inpatient Hospital Stay: Admit: 2016-01-17 | Payer: MEDICARE | Primary: Internal Medicine

## 2016-01-17 DIAGNOSIS — R05 Cough: Secondary | ICD-10-CM

## 2016-01-26 ENCOUNTER — Encounter

## 2016-01-29 MED ORDER — ALBUTEROL SULFATE HFA 90 MCG/ACTUATION AEROSOL INHALER
90 mcg/actuation | RESPIRATORY_TRACT | 12 refills | Status: DC | PRN
Start: 2016-01-29 — End: 2017-02-19

## 2016-01-29 NOTE — Telephone Encounter (Signed)
Last office visit- 10/06/15  Next office visit- 03/04/16  Last Refill- 06/19/15    Requested Prescriptions     Pending Prescriptions Disp Refills   ??? albuterol (PROVENTIL HFA, VENTOLIN HFA, PROAIR HFA) 90 mcg/actuation inhaler 1 Inhaler 0     Sig: Take 2 Puffs by inhalation every four (4) hours as needed for Wheezing.

## 2016-01-29 NOTE — Telephone Encounter (Signed)
From: Leslie Potter  To: Sonia Sideichard Y Pang, MD  Sent: 01/26/2016 3:49 PM EDT  Subject:  Medication Renewal Request    Original  authorizing provider: Sonia Sideichard Y Pang, MD    Leslie Mochaarnell  H. Potter would like a refill of the following medications:  albuterol  (PROVENTIL HFA, VENTOLIN HFA, PROAIR HFA) 90 mcg/actuation inhaler Sonia Side[Richard Y Pang, MD]    [place]  [facility] [place]     Comment:  Need  refill [name] may have contacted you. Thanks

## 2016-02-23 ENCOUNTER — Encounter: Payer: Self-pay | Admitting: *Deleted

## 2016-02-23 NOTE — Progress Notes (Signed)
Cardiology Office Note  Date: 02/26/2016   ID: Amanda Jennings, DOB 02-03-1941, MRN [id number]  PCP: PROVIDER NOT IN SYSTEM  Primary Cardiologist: Nona Dell, MD   Chief Complaint  Patient presents with  . Coronary Artery Disease    History of Present Illness: Amanda Jennings is a 75 y.o. female last seen in August. She presents for a follow-up visit. She has seen a pulmonologist in the interim and has been placed on inhalers for treatment of asthma/COPD. She does feel better with this intervention. She does not endorse any chest pain.  She was referred for a follow-up Myoview after the last visit, overall reassuring results as outlined below. We went over this today, and will plan to continue with observation.  Past Medical History:  Diagnosis Date  . Arthritis   . Asthma   . Bradycardia   . Cataracts, bilateral   . Cholelithiasis   . Coronary atherosclerosis of native coronary artery    Mild atherosclerosis  . Depression   . GERD (gastroesophageal reflux disease)   . History of positive PPD   . Hx of migraines   . Hyperlipidemia   . Hypothyroidism   . Osteopenia     Current Outpatient Prescriptions  Medication Sig Dispense Refill  . albuterol (PROVENTIL HFA;VENTOLIN HFA) 108 (90 Base) MCG/ACT inhaler Inhale into the lungs every 6 (six) hours as needed for wheezing or shortness of breath.    . Ascorbic Acid (VITAMIN C ER PO) Take 4 tablets by mouth 2 (two) times daily.    . Calcium in Bone Mineral Cmplx (OSTIGEN) 350 MG MISC Take 2 each by mouth 2 (two) times daily.    . Calcium-Magnesium 500-250 MG TABS Take 1 tablet by mouth daily.     Marland Kitchen CHERATUSSIN DAC 30-10-100 MG/5ML solution Take 5 mLs by mouth at bedtime as needed.     . Cholecalciferol (VITAMIN D3) 5000 UNITS CAPS Take 1 capsule by mouth daily.    . fexofenadine (ALLEGRA) 180 MG tablet Take 180 mg by mouth daily.    . Fluticasone-Salmeterol (ADVAIR DISKUS IN) Inhale into the lungs.    . folic acid  (FOLVITE) 400 MCG tablet Take 400 mcg by mouth daily.    Marland Kitchen levothyroxine (SYNTHROID, LEVOTHROID) 100 MCG tablet Take 125 mcg by mouth daily.     Marland Kitchen liothyronine (CYTOMEL) 5 MCG tablet Take 5 mcg by mouth daily.    . montelukast (SINGULAIR) 10 MG tablet Take 10 mg by mouth at bedtime.    . Omega-3 Fatty Acids (FISH OIL) 1000 MG CAPS Take 1 capsule by mouth 2 (two) times daily.    . pravastatin (PRAVACHOL) 40 MG tablet Take 40 mg by mouth daily.    . Quercetin 250 MG TABS Take 1 tablet by mouth as needed.     . ranitidine (ZANTAC) 150 MG tablet Take 150 mg by mouth as needed for heartburn.    . SUMAtriptan (IMITREX) 25 MG tablet Take 25 mg by mouth every 2 (two) hours as needed.     . vitamin B-12 (CYANOCOBALAMIN) 1000 MCG tablet Take 1,000 mcg by mouth daily.     No current facility-administered medications for this visit.    Allergies:  Ciprofloxacin   Social History: The patient  reports that she has never smoked. She has never used smokeless tobacco. She reports that she does not drink alcohol or use drugs.   ROS:  Please see the history of present illness. Otherwise, complete review of systems is positive for none.  All other systems are reviewed and negative.   Physical Exam: VS:  BP 108/86   Pulse 66   Ht 5\' 4"  (1.626 m)   Wt 172 lb (78 kg)   SpO2 99%   BMI 29.52 kg/m , BMI Body mass index is 29.52 kg/m.  Wt Readings from Last 3 Encounters:  02/26/16 172 lb (78 kg)  01/01/16 174 lb (78.9 kg)  07/06/15 169 lb 3.2 oz (76.7 kg)    Well-nourished woman in no acute distress.  HEENT: Conjunctiva and lids normal, oropharynx with moist mucosa.  Neck: Supple, no elevated JVP or carotid bruits, no thyromegaly.  Lungs: Clear to auscultation, nonlabored.  Cardiac: Regular rate and rhythm, no significant systolic murmur, no S3.  Abdomen: Soft, nontender. Skin: Warm and dry.  Extremities: No pitting edema, distal pulses full.  ECG: I personally reviewed the tracing from  07/06/2015 which showed sinus bradycardia with leftward axis and borderline low-voltage.  Recent Labwork:  April 2017: Cholesterol 211, triglycerides 187, HDL 54, LDL 120  Other Studies Reviewed Today:  Echocardiogram 07/21/2014: Study Conclusions  - Left ventricle: The cavity size was normal. Wall thickness was normal. The estimated ejection fraction was 60%. Wall motion was normal; there were no regional wall motion abnormalities. - Left atrium: The atrium was mildly dilated. - Right ventricle: The cavity size was normal. Systolic function was normal.  Cardiac catheterization 03/31/2006 [facility] LAD is large vessel proximally, tapering to a small caliber distally, giving off several small to medium-sized diagonal branches, 20-30% stenosis within the midportion of the LAD. Left circumflex is nondominant with several large obtuse marginals, no obstructive disease. RCA is dominant with PDA, and no significant obstructive CAD. LVEF estimated at 55% with no mitral regurgitation.  Lexiscan Myoview 01/02/2016:  There was no ST segment deviation noted during stress.  The study is normal. There are no perfusion defects consistent with prior infarction or current ischemia.  This is a low risk study.  The left ventricular ejection fraction is normal (55-65%).  Assessment and Plan:  1. History of mild coronary atherosclerosis, recent follow-up Myoview was low risk without evidence of ischemia to suggest significant progression. Would continue with observation. She continues on statin therapy.  2. Hyperlipidemia, on Pravachol.  Current medicines were reviewed with the patient today.  Disposition: Follow-up with me in one year.  Signed, Jonelle SidleSamuel G. Mylei Brackeen, MD, [facility] 02/26/2016 11:13 AM    [facility] [address], [place], [place] [address] Phone: [phone]; Fax: [phone]

## 2016-02-26 ENCOUNTER — Ambulatory Visit (INDEPENDENT_AMBULATORY_CARE_PROVIDER_SITE_OTHER): Payer: Medicare Other | Admitting: Cardiology

## 2016-02-26 ENCOUNTER — Encounter: Payer: Self-pay | Admitting: Cardiology

## 2016-02-26 VITALS — BP 108/86 | HR 66 | Ht 64.0 in | Wt 172.0 lb

## 2016-02-26 DIAGNOSIS — I2510 Atherosclerotic heart disease of native coronary artery without angina pectoris: Secondary | ICD-10-CM | POA: Diagnosis not present

## 2016-02-26 DIAGNOSIS — E782 Mixed hyperlipidemia: Secondary | ICD-10-CM | POA: Diagnosis not present

## 2016-02-26 NOTE — Patient Instructions (Signed)

## 2016-02-29 ENCOUNTER — Telehealth

## 2016-02-29 NOTE — Telephone Encounter (Signed)
Leslie MochaDarnell H Lysaght 31-Jan-1941     Pt states that she has been feeling dizzy lately and just not feeling well and would like to know if Dr. Ricki MillerPang can add Vitamin D, Magnesium, and B-12 added to current Lab order.

## 2016-02-29 NOTE — Telephone Encounter (Signed)
Per Dr Ricki MillerPang requested labs pended and sent to him to be signed. He states that it is difficult to get b12 labs covered so he prefers not to do this lab

## 2016-02-29 NOTE — Telephone Encounter (Signed)
orders signed

## 2016-03-04 ENCOUNTER — Encounter: Attending: Internal Medicine | Primary: Internal Medicine

## 2016-03-15 ENCOUNTER — Encounter

## 2016-04-09 ENCOUNTER — Inpatient Hospital Stay: Admit: 2016-05-29 | Payer: MEDICARE | Primary: Internal Medicine

## 2016-04-09 ENCOUNTER — Encounter: Attending: Internal Medicine | Primary: Internal Medicine

## 2016-04-09 DIAGNOSIS — R0602 Shortness of breath: Secondary | ICD-10-CM

## 2016-04-11 LAB — CBC WITH AUTOMATED DIFF
ABS. BASOPHILS: 0 10*3/uL (ref 0.0–0.2)
ABS. EOSINOPHILS: 0.1 10*3/uL (ref 0.0–0.4)
ABS. IMM. GRANS.: 0 10*3/uL (ref 0.0–0.1)
ABS. MONOCYTES: 0.7 10*3/uL (ref 0.1–0.9)
ABS. NEUTROPHILS: 3.6 10*3/uL (ref 1.4–7.0)
Abs Lymphocytes: 1.8 10*3/uL (ref 0.7–3.1)
BASOPHILS: 1 %
EOSINOPHILS: 1 %
HCT: 43.5 % (ref 34.0–46.6)
HGB: 14.3 g/dL (ref 11.1–15.9)
IMMATURE GRANULOCYTES: 0 %
Lymphocytes: 29 %
MCH: 29.7 pg (ref 26.6–33.0)
MCHC: 32.9 g/dL (ref 31.5–35.7)
MCV: 90 fL (ref 79–97)
MONOCYTES: 11 %
NEUTROPHILS: 58 %
PLATELET: 295 10*3/uL (ref 150–379)
RBC: 4.82 x10E6/uL (ref 3.77–5.28)
RDW: 13.7 % (ref 12.3–15.4)
WBC: 6.1 10*3/uL (ref 3.4–10.8)

## 2016-04-11 LAB — VITAMIN D, 25 HYDROXY: VITAMIN D, 25-HYDROXY: 51.3 ng/mL (ref 30.0–100.0)

## 2016-04-11 LAB — METABOLIC PANEL, COMPREHENSIVE
A-G Ratio: 2.6 — ABNORMAL HIGH (ref 1.2–2.2)
ALT (SGPT): 19 IU/L (ref 0–32)
AST (SGOT): 22 IU/L (ref 0–40)
Albumin: 4.5 g/dL (ref 3.5–4.8)
Alk. phosphatase: 71 IU/L (ref 39–117)
BUN/Creatinine ratio: 14 (ref 12–28)
BUN: 11 mg/dL (ref 8–27)
Bilirubin, total: 0.3 mg/dL (ref 0.0–1.2)
CO2: 26 mmol/L (ref 18–29)
Calcium: 9.1 mg/dL (ref 8.7–10.3)
Chloride: 99 mmol/L (ref 96–106)
Creatinine: 0.78 mg/dL (ref 0.57–1.00)
GFR est AA: 85 mL/min/{1.73_m2} (ref >59)
GFR est non-AA: 74 mL/min/{1.73_m2} (ref >59)
GLOBULIN, TOTAL: 1.7 g/dL (ref 1.5–4.5)
Glucose: 95 mg/dL (ref 65–99)
Potassium: 4.2 mmol/L (ref 3.5–5.2)
Protein, total: 6.2 g/dL (ref 6.0–8.5)
Sodium: 140 mmol/L (ref 134–144)

## 2016-04-11 LAB — MAGNESIUM: Magnesium: 2 mg/dL (ref 1.6–2.3)

## 2016-04-11 LAB — LIPID PANEL
Cholesterol, total: 195 mg/dL (ref 100–199)
HDL Cholesterol: 52 mg/dL (ref >39)
LDL, calculated: 111 mg/dL — ABNORMAL HIGH (ref 0–99)
Triglyceride: 158 mg/dL — ABNORMAL HIGH (ref 0–149)
VLDL, calculated: 32 mg/dL (ref 5–40)

## 2016-04-11 LAB — CVD REPORT

## 2016-04-12 ENCOUNTER — Ambulatory Visit: Admit: 2016-04-12 | Discharge: 2016-04-18 | Payer: MEDICARE | Attending: Internal Medicine | Primary: Internal Medicine

## 2016-04-12 DIAGNOSIS — H8113 Benign paroxysmal vertigo, bilateral: Secondary | ICD-10-CM

## 2016-04-12 LAB — AMB POC HEMOGLOBIN A1C: Hemoglobin A1c (POC): 5 %

## 2016-04-12 MED ORDER — ZOSTER VACCINE LIVE (PF) 19,400 UNIT SUB-Q SOLN
19400 unit/0.65 mL | Freq: Once | SUBCUTANEOUS | 0 refills | Status: AC
Start: 2016-04-12 — End: 2016-04-12

## 2016-04-12 NOTE — Progress Notes (Signed)
Chief Complaint   Patient presents with   ??? Hypothyroidism     6 month follow up     1. Have you been to the ER, urgent care clinic since your last visit?  Hospitalized since your last visit?No    2. Have you seen or consulted any other health care providers outside of the [facility] since your last visit?  Include any pap smears or colon screening. No

## 2016-04-12 NOTE — Progress Notes (Signed)
Hypothyroidism (6 month follow up)       HPI:  Leslie Potter is a 75 y.o. year old female who is here for a follow up visit.  She was last seen by me on 04/09/2016.   She reports the following:  Dizzy spell for 3 months    Had a stress test- with cardiologist.    Saw pulmonologist.  PFT. Asthma and mild copd.  advair disc and singulair.    Nystatin during the day.  Started 20th of September.    Reducing advair to once a day    Went to GYN- ovarian cyst on right.    Hyperlipidemia    Patient has history of hyperlipidemia that is being managed with medications. She has been taking her statin cholesterol medication regularly without side effects such as myalgias or upper abdominal pain, nausea or jaundice.     Lab Results   Component Value Date/Time    Cholesterol, total 195 04/09/2016 08:50 AM    HDL Cholesterol 52 04/09/2016 08:50 AM    LDL, calculated 111 04/09/2016 08:50 AM    VLDL, calculated 32 04/09/2016 08:50 AM    Triglyceride 158 04/09/2016 08:50 AM         Hypothyroidism    Patient is being follow for underactive thyroid and takes medication regularly.  She is aware to take the medication on an empty stomach first thing in the morning and not to eat 30 min to 1 hr after taking their pill.  No symptoms of hypo or hyperthyroidism: no decreased or increased weight, no feeling cold/chilly or excessively warm, no diarrhea or constipation, no undue sweatiness, anxiety or palpitations.     Lab Results   Component Value Date/Time    TSH 3.190 10/06/2015 10:53 AM    TSH 1.450 05/08/2015 02:17 PM           Assessment and Plan        1. Benign paroxysmal positional vertigo due to bilateral vestibular disorder  Likely BPV.  Try vertigo PT near her home.  Referral given by paper.  - MONTELUKAST SODIUM (SINGULAIR PO); Take  by mouth.  - raNITIdine (ZANTAC) 150 mg tablet; Take 150 mg by mouth two (2) times a day.  - fexofenadine (ALLEGRA) 180 mg tablet; Take  by mouth.  - REFERRAL TO PHYSICAL THERAPY     2. IFG (impaired fasting glucose)  5.0 now.  No need to check further.   - AMB POC HEMOGLOBIN A1C    3. Small airways disease  Saw lung doctor and diagnosed with asthma.  Doing better on combination inhaler.     4. Hypothyroidism, unspecified type  Tolerating thyroid medication and takes on empty stomach.  Aware to watch for symptoms of overactive thyroid like heat intolerance, loose stools, increase appetite, and weight loss along with palpitations.   Pt informed if low thyroid symptoms, like cold intolerance, weight gain, hair loss, edema, and mental slowness, please call to get TSH rechecked.  Noncompliance of medications can lead to coma and severe mental status changes.  Stay on current regimen of  Synthroid and cytomel previous doctor regimen    5. Pure hypercholesterolemia  The nature of cardiac risk has been fully discussed with this patient. I have made her aware of her LDL target goal given her cardiovascular risk analysis. I have discussed the appropriate diet. The need for lifelong compliance in order to reduce risk is stressed. A regular exercise program is recommended to help achieve and maintain normal  body weight, fitness and improve lipid balance.  The risks and benefits of medications were discussed.    Last cholesterol labs reviewed with patient.  Patient made aware to get liver checked every 6 months.  Continue with  Pravastatin.        Visit Vitals   ??? BP 110/84 (BP 1 Location: Left arm, BP Patient Position: Sitting)   ??? Pulse 80   ??? Temp 97 ??F (36.1 ??C) (Oral)   ??? Resp 14   ??? Ht 5\' 4"  (1.626 m)   ??? Wt 171 lb 11.2 oz (77.9 kg)   ??? SpO2 98%   ??? BMI 29.47 kg/m2       Historical Data    Past Medical History:   Diagnosis Date   ??? Arthritis     hands/knees   ??? Dysthymia 05/08/2015   ??? Family history of heart attack 05/08/2015   ??? GERD (gastroesophageal reflux disease)    ??? History of cholecystectomy 05/08/2015   ??? History of prediabetes 05/08/2015   ??? History of sinus bradycardia 05/08/2015    ??? Hx of diverticulitis of colon 05/08/2015   ??? Hypothyroidism 05/08/2015   ??? Pure hypercholesterolemia 05/08/2015   ??? 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 Thyroid disease        Past Surgical History:   Procedure Laterality Date   ??? HX CATARACT REMOVAL Bilateral 2010   ??? HX OTHER SURGICAL      colon polyp removed   ??? HX RETINAL DETACHMENT REPAIR Right 2008    tears   ??? HX TUBAL LIGATION     ??? UPPER GI ENDOSCOPY,BIOPSY  01/06/2015            Outpatient Encounter Prescriptions as of 04/12/2016   Medication Sig Dispense Refill   ??? MONTELUKAST SODIUM (SINGULAIR PO) Take  by mouth.     ??? raNITIdine (ZANTAC) 150 mg tablet Take 150 mg by mouth two (2) times a day.     ??? fexofenadine (ALLEGRA) 180 mg tablet Take  by mouth.     ??? [EXPIRED] varicella zoster vaccine live (ZOSTAVAX) 19,400 unit/0.65 mL susr injection 1 Vial by SubCUTAneous route once for 1 dose. 0.65 mL 0   ??? albuterol (PROVENTIL HFA, VENTOLIN HFA, PROAIR HFA) 90 mcg/actuation inhaler Take 2 Puffs by inhalation every four (4) hours as needed for Wheezing. 1 Inhaler 12   ??? fluticasone (FLOVENT HFA) 220 mcg/actuation inhaler Take 1 Puff by inhalation two (2) times a day. 1 Inhaler 3   ??? levothyroxine (SYNTHROID) 112 mcg tablet Take 1 Tab by mouth Daily (before breakfast). 90 Tab 6   ??? liothyronine (CYTOMEL) 5 mcg tablet Take 1 Tab by mouth daily. 90 Tab 4   ??? omeprazole (PRILOSEC) 40 mg capsule Take 1 Cap by mouth daily. Indications: GASTROESOPHAGEAL REFLUX 90 Cap 6   ??? pravastatin (PRAVACHOL) 40 mg tablet Take 1 Tab by mouth nightly. 30 Tab 12   ??? docusate sodium (COLACE) 100 mg capsule Take 1 Cap by mouth two (2) times a day. May use OTC instead. Prevents constipation from narcotics. 60 Cap 0   ??? BENEFIBER, WHEAT DEXTRIN, PO Take  by mouth daily.     ??? ascorbic acid (VITAMIN C) 1,000 mg tablet Take 1,000 mg by mouth daily. 2 daily     ??? calcium-magnesium-zinc Tab Take 1,000 mg by mouth daily.     ??? FLAXSEED OIL Take 1,000 mg by mouth two (2) times a day.      ???  GUAIFENESIN/DEXTROMETHORPHAN (TUSSIN DM MAX PO) Take  by mouth.       No facility-administered encounter medications on file as of 04/12/2016.         Allergies   Allergen Reactions   ??? Simvastatin Myalgia        Social History     Social History   ??? Marital status: DIVORCED     Spouse name: N/A   ??? Number of children: N/A   ??? Years of education: N/A     Occupational History   ??? Not on file.     Social History Main Topics   ??? Smoking status: Never Smoker   ??? Smokeless tobacco: Never Used   ??? Alcohol use No   ??? Drug use: No   ??? Sexual activity: No     Other Topics Concern   ??? Not on file     Social History Narrative        family history includes Colon Cancer in her sister; Heart Disease in her brother, father, mother, and sister.     Review of Systems   Constitutional: Negative for weight loss.   Eyes: Negative for blurred vision.   Respiratory: Negative for shortness of breath.    Cardiovascular: Negative for chest pain.   Gastrointestinal: Negative for abdominal pain.   Genitourinary: Negative for dysuria and frequency.   Skin: Negative for rash.   Neurological: Negative for dizziness, focal weakness, weakness and headaches.   Endo/Heme/Allergies: Negative for environmental allergies. Does not bruise/bleed easily.         Physical Exam   Constitutional: She appears well-developed and well-nourished. She is active.  Non-toxic appearance. She does not have a sickly appearance. She does not appear ill. No distress.   Eyes: Conjunctivae are normal. Right eye exhibits no discharge.   Cardiovascular: Normal rate, regular rhythm, S1 normal, S2 normal, normal heart sounds and normal pulses.  Exam reveals no gallop and no friction rub.    Pulmonary/Chest: Effort normal and breath sounds normal. No respiratory distress.   Abdominal: Soft. Bowel sounds are normal.   Musculoskeletal: She exhibits no edema or deformity.   Neurological: She is alert.   Skin: Skin is warm and dry. No rash noted. No pallor.    Psychiatric: She has a normal mood and affect. Her behavior is normal.   Vitals reviewed.     Ortho Exam      Orders Placed This Encounter   ??? REFERRAL TO PHYSICAL THERAPY     Referral Priority:   Routine     Referral Type:   PT/OT/ST     Referral Reason:   Specialty Services Required   ??? AMB POC HEMOGLOBIN A1C   ??? MONTELUKAST SODIUM (SINGULAIR PO)     Sig: Take  by mouth.   ??? raNITIdine (ZANTAC) 150 mg tablet     Sig: Take 150 mg by mouth two (2) times a day.   ??? fexofenadine (ALLEGRA) 180 mg tablet     Sig: Take  by mouth.   ??? varicella zoster vaccine live (ZOSTAVAX) 19,400 unit/0.65 mL susr injection     Sig: 1 Vial by SubCUTAneous route once for 1 dose.     Dispense:  0.65 mL     Refill:  0        I have reviewed the patient's medical history in detail and updated the computerized patient record.     We had a prolonged discussion about these complex clinical issues and went over the various  important aspects to consider. All questions were answered.     Advised her to call back or return to office if symptoms do not improve, change in nature, or persist.    She was given an after visit summary or informed of MyChart Access which includes patient instructions, diagnoses, current medications, & vitals.  She expressed understanding with the diagnosis and plan.

## 2016-07-31 MED ORDER — PRAVASTATIN 40 MG TAB
40 mg | ORAL_TABLET | ORAL | 12 refills | Status: DC
Start: 2016-07-31 — End: 2017-08-06

## 2016-10-04 ENCOUNTER — Encounter: Attending: Internal Medicine | Primary: Internal Medicine

## 2016-10-10 ENCOUNTER — Encounter: Attending: Internal Medicine | Primary: Internal Medicine

## 2016-12-09 ENCOUNTER — Ambulatory Visit: Admit: 2016-12-09 | Discharge: 2016-12-09 | Payer: MEDICARE | Attending: Internal Medicine | Primary: Internal Medicine

## 2016-12-09 ENCOUNTER — Encounter

## 2016-12-09 ENCOUNTER — Inpatient Hospital Stay: Admit: 2017-03-12 | Payer: MEDICARE | Primary: Internal Medicine

## 2016-12-09 DIAGNOSIS — R0600 Dyspnea, unspecified: Secondary | ICD-10-CM

## 2016-12-09 MED ORDER — PREDNISONE 20 MG TAB
20 mg | ORAL_TABLET | ORAL | 0 refills | Status: DC
Start: 2016-12-09 — End: 2017-04-17

## 2016-12-09 NOTE — Progress Notes (Signed)
Patient came into office to have labs collected. Lab order in system is expired. Labs re ordered and printed for lab [employer]

## 2016-12-09 NOTE — Progress Notes (Signed)
Chief Complaint   Patient presents with   ??? Thyroid Problem     follow up     1. Have you been to the ER, urgent care clinic since your last visit?  Hospitalized since your last visit? [facility].     2. Have you seen or consulted any other health care providers outside of the [facility] since your last visit?  Include any pap smears or colon screening. Dr. Frankey PootBurger (endocrynology) Dr. Altamease OilerGergh (pulmonary)    Visit Vitals   ??? BP 104/76 (BP 1 Location: Left arm, BP Patient Position: Sitting)   ??? Pulse 74   ??? Temp 97.5 ??F (36.4 ??C) (Oral)   ??? Resp 18   ??? Ht 5\' 4"  (1.626 m)   ??? Wt 171 lb (77.6 kg)   ??? SpO2 97%   ??? BMI 29.35 kg/m2

## 2016-12-09 NOTE — Progress Notes (Signed)
Thyroid Problem (follow up)       HPI:  Leslie Potter is a 76 y.o. year old female who is here for a follow up visit.  She was last seen by me on Visit date not found.   She reports the following:    Still lives in [place].  Sees heart doctor.    Exercises has eliminated vertigo.    Still sees lung doctor.  Started spiriva.   Couldn't take the one with steroids- caused thrush.  Noticed more SOB since coming here.  Can't take advair as it caused thrush.      On 100 mcg synthroid.  - from 125.    Dr. Luster Landsberg in April  Feeling tired at this dose.  No constipation.            Assessment and Plan        1. Dyspnea, unspecified type  Had recent stress test.  No wheezing.  Started since coming here to [place].  At rest also but she says it is not bad now.  Associated with cough and some post nasal drip.  Cough is dry.  Call by end of week if she is not better.   - predniSONE (DELTASONE) 20 mg tablet; Take 3 tabs once a day for 3 days, 2 tabs once a day for 2 days, 1 tab once daily for 3 days  Dispense: 18 Tab; Refill: 0    2. Hypothyroidism, unspecified type  Will recheck TSH for Dr. Luster Landsberg.  Pt states her dose was changed from 125 to 100 and she is feeling tired.     3. Left hip pain  Likely bursitis, but will check xray.  Hurts to lay on it.   - XR HIP LT W OR WO PELV 2-3 VWS; Future    4. Osteopenia of multiple sites  Due for DXA.  She will get at [facility]. Thelma Barge  - DEXA BONE DENSITY STUDY AXIAL; Future    5. Medicare annual wellness visit, subsequent  Completed today.  Pt drives to [place] about once a month and stays with daughter.     6. Screening for depression  PHQ over the last two weeks 12/09/2016   Little interest or pleasure in doing things Not at all   Feeling down, depressed, irritable, or hopeless Not at all   Total Score PHQ 2 0        7. Small airways disease  Suspect cause of her SOB.  No chest pain.  Use albuterol.  Call if not better.    Wt Readings from Last 3 Encounters:    12/09/16 171 lb (77.6 kg)   04/12/16 171 lb 11.2 oz (77.9 kg)   10/06/15 168 lb (76.2 kg)              Visit Vitals   ??? BP 104/76 (BP 1 Location: Left arm, BP Patient Position: Sitting)   ??? Pulse 74   ??? Temp 97.5 ??F (36.4 ??C) (Oral)   ??? Resp 18   ??? Ht 5\' 4"  (1.626 m)   ??? Wt 171 lb (77.6 kg)   ??? SpO2 97%   ??? BMI 29.35 kg/m2       Historical Data    Past Medical History:   Diagnosis Date   ??? Arthritis     hands/knees   ??? Dysthymia 05/08/2015   ??? Family history of heart attack 05/08/2015   ??? GERD (gastroesophageal reflux disease)    ??? History of cholecystectomy 05/08/2015   ???  History of prediabetes 05/08/2015   ??? History of sinus bradycardia 05/08/2015   ??? Hx of diverticulitis of colon 05/08/2015   ??? Hypothyroidism 05/08/2015   ??? Pure hypercholesterolemia 05/08/2015   ??? 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 Thyroid disease        Past Surgical History:   Procedure Laterality Date   ??? HX CATARACT REMOVAL Bilateral 2010   ??? HX OTHER SURGICAL      colon polyp removed   ??? HX OTHER SURGICAL      ovarian cyst removed   ??? HX RETINAL DETACHMENT REPAIR Right 2008    tears   ??? HX TUBAL LIGATION     ??? UPPER GI ENDOSCOPY,BIOPSY  01/06/2015            Outpatient Encounter Prescriptions as of 12/09/2016   Medication Sig Dispense Refill   ??? tiotropium (SPIRIVA WITH HANDIHALER) 18 mcg inhalation capsule Take 1 Cap by inhalation daily.     ??? Omega-3 Fatty Acids (FISH OIL) 500 mg cap Take  by mouth daily.     ??? predniSONE (DELTASONE) 20 mg tablet Take 3 tabs once a day for 3 days, 2 tabs once a day for 2 days, 1 tab once daily for 3 days 18 Tab 0   ??? pravastatin (PRAVACHOL) 40 mg tablet TAKE ONE TABLET BY MOUTH ONCE DAILY IN THE EVENING 30 Tab 12   ??? MONTELUKAST SODIUM (SINGULAIR PO) Take  by mouth.     ??? raNITIdine (ZANTAC) 150 mg tablet Take 150 mg by mouth two (2) times a day.     ??? fexofenadine (ALLEGRA) 180 mg tablet Take  by mouth.     ??? albuterol (PROVENTIL HFA, VENTOLIN HFA, PROAIR HFA) 90 mcg/actuation  inhaler Take 2 Puffs by inhalation every four (4) hours as needed for Wheezing. 1 Inhaler 12   ??? levothyroxine (SYNTHROID) 112 mcg tablet Take 1 Tab by mouth Daily (before breakfast). (Patient taking differently: Take 100 mcg by mouth Daily (before breakfast).) 90 Tab 6   ??? liothyronine (CYTOMEL) 5 mcg tablet Take 1 Tab by mouth daily. 90 Tab 4   ??? omeprazole (PRILOSEC) 40 mg capsule Take 1 Cap by mouth daily. Indications: GASTROESOPHAGEAL REFLUX 90 Cap 6   ??? docusate sodium (COLACE) 100 mg capsule Take 1 Cap by mouth two (2) times a day. May use OTC instead. Prevents constipation from narcotics. 60 Cap 0   ??? ascorbic acid (VITAMIN C) 1,000 mg tablet Take 1,000 mg by mouth daily. 2 daily     ??? calcium-magnesium-zinc Tab Take 1,000 mg by mouth daily.     ??? fluticasone (FLOVENT HFA) 220 mcg/actuation inhaler Take 1 Puff by inhalation two (2) times a day. 1 Inhaler 3   ??? GUAIFENESIN/DEXTROMETHORPHAN (TUSSIN DM MAX PO) Take  by mouth.     ??? BENEFIBER, WHEAT DEXTRIN, PO Take  by mouth daily.     ??? FLAXSEED OIL Take 1,000 mg by mouth two (2) times a day.       No facility-administered encounter medications on file as of 12/09/2016.         Allergies   Allergen Reactions   ??? Simvastatin Myalgia        Social History     Social History   ??? Marital status: DIVORCED     Spouse name: N/A   ??? Number of children: N/A   ??? Years of education: N/A     Occupational History   ??? Not on file.  Social History Main Topics   ??? Smoking status: Never Smoker   ??? Smokeless tobacco: Never Used   ??? Alcohol use No   ??? Drug use: No   ??? Sexual activity: No     Other Topics Concern   ??? Not on file     Social History Narrative        family history includes Colon Cancer in her sister; Heart Disease in her brother, father, mother, and sister.     Review of Systems   Constitutional: Negative for weight loss.   Eyes: Negative for blurred vision.   Respiratory: Negative for shortness of breath.    Cardiovascular: Negative for chest pain.    Gastrointestinal: Negative for abdominal pain.   Genitourinary: Negative for dysuria and frequency.   Skin: Negative for rash.   Neurological: Negative for dizziness, focal weakness, weakness and headaches.   Endo/Heme/Allergies: Negative for environmental allergies. Does not bruise/bleed easily.         Physical Exam   Constitutional: She appears well-developed and well-nourished. She is active.  Non-toxic appearance. She does not have a sickly appearance. She does not appear ill. No distress.   HENT:   Mouth/Throat: Oropharyngeal exudate present.   Eyes: Conjunctivae are normal. Right eye exhibits no discharge.   Cardiovascular: Normal rate, regular rhythm, S1 normal, S2 normal, normal heart sounds and normal pulses.  Exam reveals no gallop and no friction rub.    Pulmonary/Chest: Effort normal. No respiratory distress. She has no wheezes. She has rales.   Abdominal: Soft. Bowel sounds are normal.   Musculoskeletal: She exhibits no deformity.   Neurological: She is alert.   Skin: Skin is warm and dry. No rash noted. No pallor.   Psychiatric: She has a normal mood and affect. Her behavior is normal.   Vitals reviewed.     Ortho Exam      Orders Placed This Encounter   ??? XR HIP LT W OR WO PELV 2-3 VWS     Standing Status:   Future     Standing Expiration Date:   01/09/2018     Order Specific Question:   Reason for Exam     Answer:   pain left hip     Order Specific Question:   Is Patient Allergic to Contrast Dye?     Answer:   Unknown   ??? DEXA BONE DENSITY STUDY AXIAL     Standing Status:   Future     Standing Expiration Date:   01/09/2018     Order Specific Question:   Reason for Exam     Answer:   Osteopenia   ??? tiotropium (SPIRIVA WITH HANDIHALER) 18 mcg inhalation capsule     Sig: Take 1 Cap by inhalation daily.   ??? Omega-3 Fatty Acids (FISH OIL) 500 mg cap     Sig: Take  by mouth daily.   ??? predniSONE (DELTASONE) 20 mg tablet     Sig: Take 3 tabs once a day for 3 days, 2 tabs once a day for 2 days, 1  tab once daily for 3 days     Dispense:  18 Tab     Refill:  0        I have reviewed the patient's medical history in detail and updated the computerized patient record.     We had a prolonged discussion about these complex clinical issues and went over the various important aspects to consider. All questions were answered.  Advised her to call back or return to office if symptoms do not improve, change in nature, or persist.    She was given an after visit summary or informed of MyChart Access which includes patient instructions, diagnoses, current medications, & vitals.  She expressed understanding with the diagnosis and plan.        Leslie Potter is a 76 y.o. female and presents for annual Medicare Wellness Visit.    Assessment of cognitive impairment: Alert and oriented x 3.  Patient denies concerns about cognitive impairment.      Problem List: Reviewed with patient and discussed risk factors.    Patient Active Problem List   Diagnosis Code   ??? Hypothyroidism E03.9   ??? Pure hypercholesterolemia E78.00   ??? History of sinus bradycardia Z86.79   ??? Family history of heart attack Z82.49   ??? History of prediabetes Z87.898   ??? Hx of diverticulitis of colon Z87.19   ??? History of cholecystectomy Z90.49   ??? Dysthymia F34.1   ??? Small airways disease J98.4       Current medical providers:  Patient Care Team:  Sonia Side, MD as PCP - General (Internal Medicine)  Ernest Mallick, MD as Surgeon (General Surgery)        End of Life Planning: This was discussed with her today and she has an advanced directive - a copy HAS NOT been provided.    Reviewed DNR/DNI and patient is no.        Depression Screen: Reviewed PQH2 done by nurse.  PHQ over the last two weeks 12/09/2016   Little interest or pleasure in doing things Not at all   Feeling down, depressed, irritable, or hopeless Not at all   Total Score PHQ 2 0       Fall Risk:   Fall Risk Assessment, last 12 mths 12/09/2016   Able to walk? Yes    Fall in past 12 months? No       Home Safety - discussion completed.  No issues found.    Hearing Loss:  denies any hearing loss    Activities of Daily Living:  Self-care.   Requires assistance with: no ADLs    Adult Nutrition Screen:  No risk factors noted.      Health Maintenance- Reviewed    AAA Screening: done  Glaucoma Screening:       Health Maintenance Due   Topic Date Due   ??? Bone Densitometry (Dexa) Screening  01/17/2004   ??? MEDICARE YEARLY EXAM  07/31/2016        Health Maintenance reviewed -  Plan:      Orders Placed This Encounter   ??? XR HIP LT W OR WO PELV 2-3 VWS   ??? DEXA BONE DENSITY STUDY AXIAL   ??? tiotropium (SPIRIVA WITH HANDIHALER) 18 mcg inhalation capsule   ??? Omega-3 Fatty Acids (FISH OIL) 500 mg cap   ??? predniSONE (DELTASONE) 20 mg tablet           Plan:    Diagnoses and all orders for this visit:    1. Dyspnea, unspecified type  -     predniSONE (DELTASONE) 20 mg tablet; Take 3 tabs once a day for 3 days, 2 tabs once a day for 2 days, 1 tab once daily for 3 days    2. Hypothyroidism, unspecified type    3. Left hip pain  -     XR HIP LT W OR WO PELV 2-3  VWS; Future    4. Osteopenia of multiple sites  -     DEXA BONE DENSITY STUDY AXIAL; Future    5. Medicare annual wellness visit, subsequent    6. Screening for depression    7. Small airways disease      Orders Placed This Encounter   ??? XR HIP LT W OR WO PELV 2-3 VWS   ??? DEXA BONE DENSITY STUDY AXIAL   ??? tiotropium (SPIRIVA WITH HANDIHALER) 18 mcg inhalation capsule   ??? Omega-3 Fatty Acids (FISH OIL) 500 mg cap   ??? predniSONE (DELTASONE) 20 mg tablet         Follow-up Disposition:  Return in about 6 months (around 06/11/2017) for Follow up 15 minutes.  Reviewed with patient the treatment plan, goals of treatment plan, and limitations of treatment plan, to include the potential for side effects from medications and procedures. If side effects occur, it is the responsibility of the patient to inform the clinic so that a change in the  treatment plan can be made in a safe manner. The patient is advised that stopping prescribed medication may cause an increase in symptoms and possible medication withdrawal symptoms. The patient is informed an emergency room evaluation may be necessary if this occurs.      Patient verbalized understanding and is in agreement with treatment plan as outlined above.  All questions answered.

## 2016-12-10 LAB — METABOLIC PANEL, COMPREHENSIVE
A-G Ratio: 2.1 (ref 1.2–2.2)
ALT (SGPT): 16 IU/L (ref 0–32)
AST (SGOT): 20 IU/L (ref 0–40)
Albumin: 4.5 g/dL (ref 3.5–4.8)
Alk. phosphatase: 78 IU/L (ref 39–117)
BUN/Creatinine ratio: 16 (ref 12–28)
BUN: 13 mg/dL (ref 8–27)
Bilirubin, total: 0.4 mg/dL (ref 0.0–1.2)
CO2: 27 mmol/L (ref 20–29)
Calcium: 9.7 mg/dL (ref 8.7–10.3)
Chloride: 99 mmol/L (ref 96–106)
Creatinine: 0.79 mg/dL (ref 0.57–1.00)
GFR est AA: 84 mL/min/{1.73_m2} (ref >59)
GFR est non-AA: 72 mL/min/{1.73_m2} (ref >59)
GLOBULIN, TOTAL: 2.1 g/dL (ref 1.5–4.5)
Glucose: 94 mg/dL (ref 65–99)
Potassium: 4.4 mmol/L (ref 3.5–5.2)
Protein, total: 6.6 g/dL (ref 6.0–8.5)
Sodium: 140 mmol/L (ref 134–144)

## 2016-12-10 LAB — CBC WITH AUTOMATED DIFF
ABS. BASOPHILS: 0 10*3/uL (ref 0.0–0.2)
ABS. EOSINOPHILS: 0.1 10*3/uL (ref 0.0–0.4)
ABS. IMM. GRANS.: 0 10*3/uL (ref 0.0–0.1)
ABS. MONOCYTES: 0.8 10*3/uL (ref 0.1–0.9)
ABS. NEUTROPHILS: 4.7 10*3/uL (ref 1.4–7.0)
Abs Lymphocytes: 2.2 10*3/uL (ref 0.7–3.1)
BASOPHILS: 0 %
EOSINOPHILS: 1 %
HCT: 44.1 % (ref 34.0–46.6)
HGB: 15 g/dL (ref 11.1–15.9)
IMMATURE GRANULOCYTES: 0 %
Lymphocytes: 28 %
MCH: 30.5 pg (ref 26.6–33.0)
MCHC: 34 g/dL (ref 31.5–35.7)
MCV: 90 fL (ref 79–97)
MONOCYTES: 11 %
NEUTROPHILS: 60 %
PLATELET: 308 10*3/uL (ref 150–379)
RBC: 4.91 x10E6/uL (ref 3.77–5.28)
RDW: 14 % (ref 12.3–15.4)
WBC: 7.9 10*3/uL (ref 3.4–10.8)

## 2016-12-10 LAB — TSH 3RD GENERATION: TSH: 2.78 u[IU]/mL (ref 0.450–4.500)

## 2016-12-10 LAB — LIPID PANEL
Cholesterol, total: 236 mg/dL — ABNORMAL HIGH (ref 100–199)
HDL Cholesterol: 57 mg/dL (ref >39)
LDL, calculated: 136 mg/dL — ABNORMAL HIGH (ref 0–99)
Triglyceride: 213 mg/dL — ABNORMAL HIGH (ref 0–149)
VLDL, calculated: 43 mg/dL — ABNORMAL HIGH (ref 5–40)

## 2016-12-10 LAB — MICROALBUMIN, UR, RAND W/ MICROALB/CREAT RATIO
Creatinine, urine random: 85.9 mg/dL
Microalb/Creat ratio (ug/mg creat.): 8.7 mg/g creat (ref 0.0–30.0)
Microalbumin, urine: 7.5 ug/mL

## 2016-12-10 LAB — CVD REPORT

## 2016-12-11 NOTE — Progress Notes (Signed)
Are you taking the pravastatin regularly?  If so, we should increase the dose to 80 mg as it did get a lot worse.  All of your other tests came back normal including your thyroid.  Message sent to patient via mychart:  December 11, 2016

## 2016-12-13 ENCOUNTER — Inpatient Hospital Stay: Admit: 2016-12-13 | Payer: MEDICARE | Attending: Internal Medicine | Primary: Internal Medicine

## 2016-12-13 DIAGNOSIS — Z780 Asymptomatic menopausal state: Secondary | ICD-10-CM

## 2016-12-13 DIAGNOSIS — M25552 Pain in left hip: Secondary | ICD-10-CM

## 2016-12-13 NOTE — Progress Notes (Signed)
No additional comment

## 2016-12-16 NOTE — Progress Notes (Signed)
Your bone density is borderline to Osteoporosis.  It is still in the osteopenic range though (which is the level before needing treatment).  Prednisone can make it worse.  Considering where it is, it is an option to start medicine to reverse it since you have risk factors more than others (needing prednisone at times.)  Let me know what you think.  Message sent to patient via mychart:  December 16, 2016

## 2017-02-04 LAB — PULMONARY FUNCTION TEST

## 2017-02-19 ENCOUNTER — Encounter

## 2017-02-19 MED ORDER — VENTOLIN HFA 90 MCG/ACTUATION AEROSOL INHALER
90 mcg/actuation | RESPIRATORY_TRACT | 12 refills | Status: DC
Start: 2017-02-19 — End: 2017-05-05

## 2017-02-27 ENCOUNTER — Encounter: Payer: Self-pay | Admitting: Cardiology

## 2017-02-27 ENCOUNTER — Ambulatory Visit (INDEPENDENT_AMBULATORY_CARE_PROVIDER_SITE_OTHER): Payer: Medicare Other | Admitting: Cardiology

## 2017-02-27 VITALS — BP 108/64 | HR 63 | Ht 64.0 in | Wt 176.0 lb

## 2017-02-27 DIAGNOSIS — E039 Hypothyroidism, unspecified: Secondary | ICD-10-CM

## 2017-02-27 DIAGNOSIS — E782 Mixed hyperlipidemia: Secondary | ICD-10-CM | POA: Diagnosis not present

## 2017-02-27 DIAGNOSIS — M25473 Effusion, unspecified ankle: Secondary | ICD-10-CM | POA: Diagnosis not present

## 2017-02-27 DIAGNOSIS — I2510 Atherosclerotic heart disease of native coronary artery without angina pectoris: Secondary | ICD-10-CM

## 2017-02-27 MED ORDER — FUROSEMIDE 20 MG PO TABS: 20.0000 mg | ORAL_TABLET | ORAL | 3 refills | Status: DC | PRN

## 2017-02-27 NOTE — Progress Notes (Signed)
Cardiology Office Note  Date: 02/27/2017   ID: Amanda Jennings, DOB 08/07/1940, MRN [id number]  PCP: Juline Patch, MD  Primary Cardiologist: Nona Dell, MD   Chief Complaint  Patient presents with  . Coronary Artery Disease    History of Present Illness: Amanda Jennings is a 76 y.o. female last seen in October 2017. She presents for a routine follow-up visit. Since last assessment she does not report any significant angina symptoms. She has had some trouble with mild ankle edema at times. Reports no major change in her sodium intake. She denies orthopnea and PND. She also states that she was diagnosed with viral allergies and is on allergy shots additional treatment for her asthma.  I reviewed her recent lab work from July as outlined below. LDL has not been optimally controlled. She states that she has not been as consistent in taking her Pravachol.  She had a follow-up Myoview last year which was low risk.  I personally reviewed her ECG today which shows sinus rhythm with leftward axis and decreased R wave progression.  She states that she is trying to stay active. She has a trip planned to [place] in the near future.  Past Medical History:  Diagnosis Date  . Arthritis   . Asthma   . Bradycardia   . Cataracts, bilateral   . Cholelithiasis   . Coronary atherosclerosis of native coronary artery    Mild atherosclerosis  . Depression   . GERD (gastroesophageal reflux disease)   . History of positive PPD   . Hx of migraines   . Hyperlipidemia   . Hypothyroidism   . Osteopenia     Past Surgical History:  Procedure Laterality Date  . Bilateral tubal ligation    . Cataract surgery    . Colonic polyp resection      Current Outpatient Prescriptions  Medication Sig Dispense Refill  . albuterol (PROVENTIL HFA;VENTOLIN HFA) 108 (90 Base) MCG/ACT inhaler Inhale into the lungs every 6 (six) hours as needed for wheezing or shortness of breath.    . Ascorbic Acid  (VITAMIN C ER PO) Take 1 tablet by mouth daily.     . Calcium in Bone Mineral Cmplx (OSTIGEN) 350 MG MISC Take 2 each by mouth 2 (two) times daily.    . Calcium-Magnesium 500-250 MG TABS Take 1 tablet by mouth daily.     . Cholecalciferol (VITAMIN D3) 5000 UNITS CAPS Take 1 capsule by mouth daily.    . fexofenadine (ALLEGRA) 180 MG tablet Take 180 mg by mouth daily.    . folic acid (FOLVITE) 400 MCG tablet Take 400 mcg by mouth daily.    Marland Kitchen levothyroxine (SYNTHROID, LEVOTHROID) 100 MCG tablet Take 125 mcg by mouth daily.     Marland Kitchen liothyronine (CYTOMEL) 5 MCG tablet Take 5 mcg by mouth daily.    . montelukast (SINGULAIR) 10 MG tablet Take 10 mg by mouth at bedtime.    . Omega-3 Fatty Acids (FISH OIL) 1000 MG CAPS Take 1 capsule by mouth 2 (two) times daily.    . pravastatin (PRAVACHOL) 40 MG tablet Take 40 mg by mouth daily.    . ranitidine (ZANTAC) 150 MG tablet Take 150 mg by mouth as needed for heartburn.    Marland Kitchen UNKNOWN TO PATIENT 2 time weekly allergy injection - doesn't know the name or dose    . furosemide (LASIX) 20 MG tablet Take 1 tablet (20 mg total) by mouth as needed (Leg swelling). 30 tablet 3   No  current facility-administered medications for this visit.    Allergies:  Ciprofloxacin   Social History: The patient  reports that she has never smoked. She has never used smokeless tobacco. She reports that she does not drink alcohol or use drugs.   ROS:  Please see the history of present illness. Otherwise, complete review of systems is positive for none.  All other systems are reviewed and negative.   Physical Exam: VS:  BP 108/64   Pulse 63   Ht  (1.626 m)   Wt 176 lb (79.8 kg)   SpO2 98%   BMI 30.21 kg/m , BMI Body mass index is 30.21 kg/m.  Wt Readings from Last 3 Encounters:  02/27/17 176 lb (79.8 kg)  02/26/16 172 lb (78 kg)  01/01/16 174 lb (78.9 kg)    General: Patient appears comfortable at rest. HEENT: Conjunctiva and lids normal, oropharynx clear. Neck:  Supple, no elevated JVP or carotid bruits, no thyromegaly. Lungs: Clear to auscultation, nonlabored breathing at rest. Cardiac: Regular rate and rhythm, no S3 or significant systolic murmur, no pericardial rub. Abdomen: Soft, nontender, bowel sounds present, no guarding or rebound. Extremities: Trace ankle edema, distal pulses 2+. Skin: Warm and dry. Musculoskeletal: No kyphosis. Neuropsychiatric: Alert and oriented x3, affect grossly appropriate.  ECG: I personally reviewed the tracing from 07/06/2015 which showed sinus bradycardia with leftward axis.  Recent Labwork:  July 2018: Potassium 4.4, BUN 13, creatinine 0.79, AST 20, ALT 16, cholesterol 236, triglycerides 213, HDL 57, LDL 136, hemoglobin 15.0, platelets 308  Other Studies Reviewed Today:  Lexiscan Myoview 01/02/2016:  There was no ST segment deviation noted during stress.  The study is normal. There are no perfusion defects consistent with prior infarction or current ischemia.  This is a low risk study.  The left ventricular ejection fraction is normal (55-65%).  Assessment and Plan:  1. Mild coronary atherosclerosis based on previous workup with Myoview study last year low risk and showing normal LVEF. Continue with observation, diet and exercise discussed. She is also on statin therapy.  2. Mild intermittent ankle edema. Prescription for Lasix 20 mg given to be used as needed. As long as this is relatively infrequent, she should not require potassium supplements.  3. Mixed hyperlipidemia, states that she has not been consistent with Pravachol. We discussed diet and medication compliance. Her LDL was 136 recently.  4. Hypothyroidism on Synthroid. Continues to follow with PCP.  Current medicines were reviewed with the patient today.   Orders Placed This Encounter  Procedures  . EKG 12-Lead    Disposition: Follow-up in one year, sooner if needed.  Signed, Jonelle Sidle, MD, [facility] 02/27/2017 10:11 AM      [facility] [address] [place], [place], [place] [address] Phone: [phone]; Fax: [phone]

## 2017-02-27 NOTE — Patient Instructions (Signed)
Medication Instructions:  Your physician has recommended you make the following change in your medication:   Begin Lasix 20 mg as needed for leg swelling  Please continue all other medications as prescribed  Labwork: NONE  Testing/Procedures: NONE  Follow-Up: Your physician wants you to follow-up in: 1 YEAR WITH DR. Diona Browner You will receive a reminder letter in the mail two months in advance. If you don't receive a letter, please call our office to schedule the follow-up appointment.  Any Other Special Instructions Will Be Listed Below (If Applicable).  If you need a refill on your cardiac medications before your next appointment, please call your pharmacy.

## 2017-04-22 ENCOUNTER — Inpatient Hospital Stay: Payer: MEDICARE

## 2017-04-22 MED ORDER — SODIUM CHLORIDE 0.9 % IJ SYRG
INTRAMUSCULAR | Status: DC | PRN
Start: 2017-04-22 — End: 2017-04-22

## 2017-04-22 MED ORDER — SIMETHICONE 40 MG/0.6 ML ORAL DROPS, SUSP
40 mg/0.6 mL | ORAL | Status: DC | PRN
Start: 2017-04-22 — End: 2017-04-22

## 2017-04-22 MED ORDER — SODIUM CHLORIDE 0.9 % IJ SYRG
Freq: Three times a day (TID) | INTRAMUSCULAR | Status: DC
Start: 2017-04-22 — End: 2017-04-22

## 2017-04-22 MED ORDER — FLUMAZENIL 0.1 MG/ML IV SOLN
0.1 mg/mL | INTRAVENOUS | Status: DC | PRN
Start: 2017-04-22 — End: 2017-04-22

## 2017-04-22 MED ORDER — NALOXONE 0.4 MG/ML INJECTION
0.4 mg/mL | INTRAMUSCULAR | Status: DC | PRN
Start: 2017-04-22 — End: 2017-04-22

## 2017-04-22 MED ORDER — EPINEPHRINE 0.1 MG/ML SYRINGE
0.1 mg/mL | Freq: Once | INTRAMUSCULAR | Status: DC | PRN
Start: 2017-04-22 — End: 2017-04-22

## 2017-04-22 MED ORDER — FENTANYL CITRATE (PF) 50 MCG/ML IJ SOLN
50 mcg/mL | INTRAMUSCULAR | Status: DC | PRN
Start: 2017-04-22 — End: 2017-04-22

## 2017-04-22 MED ORDER — ATROPINE 0.1 MG/ML SYRINGE
0.1 mg/mL | Freq: Once | INTRAMUSCULAR | Status: DC | PRN
Start: 2017-04-22 — End: 2017-04-22

## 2017-04-22 MED ORDER — SODIUM CHLORIDE 0.9 % IV
INTRAVENOUS | Status: DC
Start: 2017-04-22 — End: 2017-04-22
  Administered 2017-04-22: 18:00:00 via INTRAVENOUS

## 2017-04-22 MED ORDER — LIDOCAINE (PF) 20 MG/ML (2 %) IJ SOLN
20 mg/mL (2 %) | INTRAMUSCULAR | Status: DC | PRN
Start: 2017-04-22 — End: 2017-04-22
  Administered 2017-04-22: 19:00:00 via INTRAVENOUS

## 2017-04-22 MED ORDER — PROPOFOL 10 MG/ML IV EMUL
10 mg/mL | INTRAVENOUS | Status: DC | PRN
Start: 2017-04-22 — End: 2017-04-22
  Administered 2017-04-22 (×3): via INTRAVENOUS

## 2017-04-22 MED ORDER — MIDAZOLAM 1 MG/ML IJ SOLN
1 mg/mL | INTRAMUSCULAR | Status: DC
Start: 2017-04-22 — End: 2017-04-22

## 2017-04-22 MED FILL — DIPRIVAN 10 MG/ML INTRAVENOUS EMULSION: 10 mg/mL | INTRAVENOUS | Qty: 150

## 2017-04-22 MED FILL — LIDOCAINE (PF) 20 MG/ML (2 %) IJ SOLN: 20 mg/mL (2 %) | INTRAMUSCULAR | Qty: 40

## 2017-04-22 MED FILL — SODIUM CHLORIDE 0.9 % IV: INTRAVENOUS | Qty: 1000

## 2017-04-22 NOTE — Procedures (Signed)
Procedures by Eugenie Filler., MD at 04/22/17 1350                Author: Eugenie Filler., MD  Service: Gastroenterology  Author Type: Physician       Filed: 04/22/17 1353  Date of Service: 04/22/17 1350  Status: Signed          Editor: Eugenie Filler., MD (Physician)            Pre-procedure Diagnoses        1. History of colonic polyps [Z86.010]                           Post-procedure Diagnoses        1. Internal hemorrhoids [K64.8]                           Procedures        1. COLORECTAL SCRN; HI RISK IND [G0105 ([place]                        Colonoscopy Operative Report      04/22/2017         MAYLEE BARE   [id number]   March 26, 1941      Procedure Type:   Colonoscopy --screening       Indications:    Personal history of colon polyps (screening only)       Pre-operative Diagnosis: see indication above      Post-operative Diagnosis:  See findings below      Operator:  Elmon Else, MD      Referring Provider: Mortimer Fries, MD         Sedation:  MAC anesthesia Propofol      Pre-Procedural Exam:        Airway: clear,  No airway problems anticipated   Heart: RRR, without gallops or rubs   Lungs: clear bilaterally without wheezes, crackles, or rhonchi   Abdomen: soft, nontender, nondistended, bowel sounds present   Mental Status: awake, alert and oriented to person, place and time       Procedure Details:  After informed consent was obtained with all risks and benefits of procedure explained and preoperative exam completed,  the patient was taken to the endoscopy suite and placed in the left lateral decubitus position.  Upon sequential sedation as per above, a digital rectal exam was performed .  The Olympus videocolonoscope  was inserted in the rectum and carefully advanced  to the cecum, which was identified by the ileocecal valve and appendiceal orifice.  The cecum was  identified by the ileocecal valve and appendiceal  orifice.  The quality of preparation was good.  The colonoscope was slowly withdrawn with careful evaluation between folds. Retroflexion in the rectum was completed demonstrating internal  hemorrhoids.       Findings:    Rectum: Grade 1 internal hemorrhoid(s);   Niger ink stain seen. No evidence of polyps.   Sigmoid: normal   Descending Colon: normal   Transverse Colon: normal   Ascending Colon: normal   Cecum: normal   Terminal Ileum: not intubated  Specimen Removed:  none      Complications: None.       EBL:  None.      Impression:    normal colonic mucosa throughout   hemorrhoids internal, Small in size   NO polyps seen.      Recommendations: --No repeat screening colonoscopy indicated due to age.  High fiber diet.  Resume normal medication(s).    Discharge Disposition:  Home in the company of a driver when able to ambulate.      Eugenie Filler., MD      04/22/2017       P. Jethro Bolus, MD   [facility], [place], [place] [address]   [phone]   [email or web address]

## 2017-04-22 NOTE — Procedures (Signed)
[facility]                  Colonoscopy Operative Report    04/22/2017      Leslie Potter  [id number]  Oct 24, 1940    Procedure Type:   Colonoscopy --screening     Indications:    Personal history of colon polyps (screening only)     Pre-operative Diagnosis: see indication above    Post-operative Diagnosis:  See findings below    Operator:  Elmon Else, MD    Referring Provider: Mortimer Fries, MD      Sedation:  MAC anesthesia Propofol    Pre-Procedural Exam:      Airway: clear,  No airway problems anticipated  Heart: RRR, without gallops or rubs  Lungs: clear bilaterally without wheezes, crackles, or rhonchi  Abdomen: soft, nontender, nondistended, bowel sounds present  Mental Status: awake, alert and oriented to person, place and time     Procedure Details:  After informed consent was obtained with all risks and benefits of procedure explained and preoperative exam completed, the patient was taken to the endoscopy suite and placed in the left lateral decubitus position.  Upon sequential sedation as per above, a digital rectal exam was performed .  The Olympus videocolonoscope  was inserted in the rectum and carefully advanced to the cecum, which was identified by the ileocecal valve and appendiceal orifice.  The cecum was identified by the ileocecal valve and appendiceal orifice.  The quality of preparation was good.  The colonoscope was slowly withdrawn with careful evaluation between folds. Retroflexion in the rectum was completed demonstrating internal hemorrhoids.     Findings:   Rectum: Grade 1 internal hemorrhoid(s);  Niger ink stain seen. No evidence of polyps.  Sigmoid: normal  Descending Colon: normal  Transverse Colon: normal  Ascending Colon: normal  Cecum: normal  Terminal Ileum: not intubated      Specimen Removed:  none    Complications: None.     EBL:  None.    Impression:    normal colonic mucosa throughout  hemorrhoids internal, Small in size   NO polyps seen.    Recommendations: --No repeat screening colonoscopy indicated due to age. High fiber diet.  Resume normal medication(s).   Discharge Disposition:  Home in the company of a driver when able to ambulate.    Eugenie Filler., MD    04/22/2017   P. Jethro Bolus, MD  [facility], [place], [place] [address]  [phone]  [email or web address]

## 2017-04-22 NOTE — H&P (Signed)
[name], MD  [facility]  [address], [address]  [place], [place] [id number]  [phone]  [email or web address]  Gastroenterology Outpatient History and Physical    Patient: Leslie Potter    Physician: Herbert MoorsPaul Frederick Duckworth Jr., MD    Vital Signs: Blood pressure 148/86, pulse 65, temperature 98.3 ??F (36.8 ??C), resp. rate 12, height 5\' 4"  (1.626 m), weight 77.1 kg (170 lb), SpO2 97 %, not currently breastfeeding.    Allergies:   Allergies   Allergen Reactions   ??? Simvastatin Myalgia       Chief Complaint: Hx of polyps    History of Present Illness: Personal history of colonic polyps. Last colonoscopy was 2013 and showed no polyps. Currently has no GI symptoms. No FH of colon cancer or polyps.      History:  Past Medical History:   Diagnosis Date   ??? Arthritis     hands/knees   ??? Asthma    ??? Chronic obstructive pulmonary disease (HCC)    ??? Dysthymia 05/08/2015   ??? Family history of heart attack 05/08/2015   ??? GERD (gastroesophageal reflux disease)    ??? History of cholecystectomy 05/08/2015   ??? History of prediabetes 05/08/2015   ??? History of sinus bradycardia 05/08/2015   ??? Hx of diverticulitis of colon 05/08/2015   ??? Hypothyroidism 05/08/2015   ??? Pure hypercholesterolemia 05/08/2015   ??? 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 Thyroid disease       Past Surgical History:   Procedure Laterality Date   ??? HX CATARACT REMOVAL Bilateral 2010   ??? HX CHOLECYSTECTOMY  2016   ??? HX OOPHORECTOMY Bilateral 2017   ??? HX OTHER SURGICAL  2012    colon polyp removed   ??? HX OTHER SURGICAL      ovarian cyst removed   ??? HX RETINAL DETACHMENT REPAIR Right 2008    tears   ??? HX TUBAL LIGATION     ??? UPPER GI ENDOSCOPY,BIOPSY  01/06/2015           Social History     Socioeconomic History   ??? Marital status: DIVORCED     Spouse name: Not on file   ??? Number of children: Not on file   ??? Years of education: Not on file   ??? Highest education level: Not on file   Tobacco Use    ??? Smoking status: Never Smoker   ??? Smokeless tobacco: Never Used   Substance and Sexual Activity   ??? Alcohol use: No   ??? Drug use: No   ??? Sexual activity: No      Family History   Problem Relation Age of Onset   ??? Heart Disease Mother    ??? Heart Disease Father    ??? Heart Disease Sister    ??? Colon Cancer Sister    ??? Heart Disease Brother       Patient Active Problem List   Diagnosis Code   ??? Hypothyroidism E03.9   ??? Pure hypercholesterolemia E78.00   ??? History of sinus bradycardia Z86.79   ??? Family history of heart attack Z82.49   ??? History of prediabetes Z87.898   ??? Hx of diverticulitis of colon Z87.19   ??? History of cholecystectomy Z90.49   ??? Dysthymia F34.1   ??? Small airways disease J98.4       Medications:   Prior to Admission medications    Medication Sig Start Date End Date Taking? Authorizing Provider   VENTOLIN HFA  90 mcg/actuation inhaler INHALE TWO PUFFS BY MOUTH EVERY 4 HOURS AS NEEDED FOR  WHEEZING 02/19/17  Yes Sonia SidePang, Richard Y, MD   Omega-3 Fatty Acids (FISH OIL) 500 mg cap Take  by mouth daily.   Yes Provider, Historical   pravastatin (PRAVACHOL) 40 mg tablet TAKE ONE TABLET BY MOUTH ONCE DAILY IN THE EVENING 07/31/16  Yes Sonia SidePang, Richard Y, MD   MONTELUKAST SODIUM (SINGULAIR PO) Take  by mouth daily.   Yes Provider, Historical   fexofenadine (ALLEGRA) 180 mg tablet Take  by mouth.   Yes Provider, Historical   levothyroxine (SYNTHROID) 112 mcg tablet Take 1 Tab by mouth Daily (before breakfast).  Patient taking differently: Take 100 mcg by mouth Daily (before breakfast). 09/14/15  Yes Sonia SidePang, Richard Y, MD   liothyronine (CYTOMEL) 5 mcg tablet Take 1 Tab by mouth daily. 06/19/15  Yes Sonia SidePang, Richard Y, MD   omeprazole (PRILOSEC) 40 mg capsule Take 1 Cap by mouth daily. Indications: GASTROESOPHAGEAL REFLUX 06/19/15  Yes Sonia SidePang, Richard Y, MD   docusate sodium (COLACE) 100 mg capsule Take 1 Cap by mouth two (2) times a day. May use OTC instead. Prevents constipation from narcotics.   Patient taking differently: Take 100 mg by mouth two (2) times daily as needed. May use OTC instead. Prevents constipation from narcotics. 01/20/15  Yes Ernest Mallickougherty, David J, MD   ascorbic acid (VITAMIN C) 1,000 mg tablet Take 1,000 mg by mouth daily. 2 daily 09/08/08  Yes Provider, Historical   calcium-magnesium-zinc Tab Take 1,000 mg by mouth daily. 09/08/08  Yes Provider, Historical       Physical Exam:     General: well developed, well nourished   HEENT: unremarkable   Heart: regular rhythm no mumur    Lungs: clear   Abdominal:  benign   Neurological: unremarkable   Extremities: no edema     Findings/Diagnosis: Personal history of colonic polyps  Plan of Care/Planned Procedure: Colonoscopy with monitored anesthesia care sedation    Signed:  Herbert MoorsPaul Frederick Duckworth Jr., MD 04/22/2017

## 2017-04-22 NOTE — Anesthesia Post-Procedure Evaluation (Signed)
Procedure(s):  COLONOSCOPY.    Anesthesia Post Evaluation        Patient location during evaluation: PACU  Note status: Adequate.  Level of consciousness: responsive to verbal stimuli and sleepy but conscious  Pain management: satisfactory to patient  Airway patency: patent  Anesthetic complications: no  Cardiovascular status: acceptable  Respiratory status: acceptable  Hydration status: acceptable  Comments: +Post-Anesthesia Evaluation and Assessment    Patient: Leslie Potter MRN: [id number]  SSN: [id number]   Date of Birth: 1940-10-25  Age: 76 y.o.  Sex: female      Cardiovascular Function/Vital Signs    BP 160/78    Pulse (!) 56    Temp 36.8 ??C (98.2 ??F)    Resp 11    Ht 5\' 4"  (1.626 m)    Wt 77.1 kg (170 lb)    SpO2 97%    Breastfeeding? No    BMI 29.18 kg/m??     Patient is status post Procedure(s):  COLONOSCOPY.    Nausea/Vomiting: Controlled.    Postoperative hydration reviewed and adequate.    Pain:  Pain Scale 1: Numeric (0 - 10) (04/22/17 1419)  Pain Intensity 1: 0 (04/22/17 1419)   Managed.    Neurological Status:       At baseline.    Mental Status and Level of Consciousness: Arousable.    Pulmonary Status:   O2 Device: Room air (04/22/17 1421)   Adequate oxygenation and airway patent.    Complications related to anesthesia: None    Post-anesthesia assessment completed. No concerns.    Signed By: Nicanor BakePaul C Eissa Buchberger, MD    04/22/2017  Post anesthesia nausea and vomiting:  controlled      Visit Vitals  BP 160/78   Pulse (!) 56   Temp 36.8 ??C (98.2 ??F)   Resp 11   Ht 5\' 4"  (1.626 m)   Wt 77.1 kg (170 lb)   SpO2 97%   Breastfeeding? No   BMI 29.18 kg/m??

## 2017-04-22 NOTE — Other (Signed)
Leslie Potter  [birth year]  [id number]    Situation:  Verbal report received from: Leslie HymenBonnie RN  Procedure: Procedure(s):  COLONOSCOPY    Background:    Preoperative diagnosis: HX OF POLYPS  Postoperative diagnosis: history of colon polyps; negative exam    Operator:  Leslie Potter  Assistant(s): Endoscopy Technician-1: Leslie Potter, Leslie Potter  Endoscopy RN-1: Leslie Potter, Leslie Potter    Specimens: * No specimens in log *  H. Pylori  no    Assessment:  Intra-procedure medications     Anesthesia gave intra-procedure sedation and medications, see anesthesia flow sheet yes    Intravenous fluids: NS@ KVO     Vital signs stable       Abdominal assessment: round and soft       Recommendation:  Discharge patient per MD order    Family or Dixie DialsFriend  Leslie Potter Daughter  Permission to share finding with family or friend yes

## 2017-04-22 NOTE — Other (Signed)
Endoscope was pre-cleaned at bedside immediately following procedure by Chelsy Davis/Bonne Radden.

## 2017-04-22 NOTE — Anesthesia Pre-Procedure Evaluation (Signed)
Anesthetic History   No history of anesthetic complications            Review of Systems / Medical History  Patient summary reviewed, nursing notes reviewed and pertinent labs reviewed    Pulmonary    COPD        Asthma        Neuro/Psych   Within defined limits      Psychiatric history     Cardiovascular  Within defined limits            CAD    Exercise tolerance: >4 METS     GI/Hepatic/Renal     GERD           Endo/Other      Hypothyroidism  Arthritis     Other Findings              Physical Exam    Airway  Mallampati: II  TM Distance: 4 - 6 cm  Neck ROM: normal range of motion   Mouth opening: Normal     Cardiovascular  Regular rate and rhythm,  S1 and S2 normal,  no murmur, click, rub, or gallop             Dental  No notable dental hx  Dentition: Bridges and Implants     Pulmonary  Breath sounds clear to auscultation               Abdominal  GI exam deferred       Other Findings            Anesthetic Plan    ASA: 2  Anesthesia type: general and total IV anesthesia          Induction: Intravenous  Anesthetic plan and risks discussed with: Patient      Propofol MAC

## 2017-05-05 ENCOUNTER — Encounter

## 2017-05-05 MED ORDER — VENTOLIN HFA 90 MCG/ACTUATION AEROSOL INHALER
90 mcg/actuation | RESPIRATORY_TRACT | 12 refills | Status: AC
Start: 2017-05-05 — End: ?

## 2017-06-05 ENCOUNTER — Encounter

## 2017-06-05 NOTE — Telephone Encounter (Signed)
From: Leslie Potter  To: Leslie Potter, Leslie Ebey Y, MD  Sent: 06/05/2017 9:05 AM EST  Subject: Non-Urgent Medical Question    Dr. Ricki MillerPang, so sorry that you are leaving. Good luck!  I will be getting lab tests Monday am.  I would like to get an a1c ( my blood sugar has been up) and  A TSH and vitamin d and CRP.  Also my knees have been bothering me alot. X-ray while I am out there?  Thanks, Merrill LynchDarnell Potter

## 2017-06-09 ENCOUNTER — Inpatient Hospital Stay: Admit: 2017-08-07 | Payer: MEDICARE | Primary: Internal Medicine

## 2017-06-09 ENCOUNTER — Encounter: Attending: Internal Medicine | Primary: Internal Medicine

## 2017-06-09 DIAGNOSIS — E039 Hypothyroidism, unspecified: Secondary | ICD-10-CM

## 2017-06-10 ENCOUNTER — Ambulatory Visit: Admit: 2017-06-10 | Discharge: 2017-06-10 | Payer: MEDICARE | Attending: Internal Medicine | Primary: Internal Medicine

## 2017-06-10 DIAGNOSIS — R0600 Dyspnea, unspecified: Secondary | ICD-10-CM

## 2017-06-10 MED ORDER — NITROFURANTOIN (25% MACROCRYSTAL FORM) 100 MG CAP
100 mg | ORAL_CAPSULE | Freq: Two times a day (BID) | ORAL | 0 refills | Status: DC
Start: 2017-06-10 — End: 2017-06-24

## 2017-06-10 NOTE — Progress Notes (Signed)
Follow-up (6 month)     HPI:  Leslie Potter is a 77 y.o. year old female who is here for a follow up visit.  She was last seen by me on Visit date not found.   She reports the following:    "pretty good"    Blood sugars have been 124- 116    Did labs yesterday and not back.    Doesn't eat a lot of sweets    a1c was 5.5.  Rest of labs not back.    Lives in [place]    Was started on lasix for leg swelling.    No SOB when lying flat.  Weight slight increase.  Getting PFT this Friday with lung doctor here.    Some SOB when walking.  No wheezing.  Not at night    OA of knees- worst when going on trip.  Causing some swelling.  Takes tylenol.  Had xray in long past and was told she had severe OA.  Wants to get xray and see possible orthopedic doctor    Getting allergy shots    Positive RBC and WBC in urine.  Reports some foul smelling urine   30 on WBC with RBC.  Nitrate positive      Wt Readings from Last 3 Encounters:   06/10/17 176 lb (79.8 kg)   04/22/17 170 lb (77.1 kg)   12/09/16 171 lb (77.6 kg)            Assessment and Plan        1. Dyspnea, unspecified type  ddx is her CHF with weight gain but not lying flat.  More likely her small airways disease as it happens in am.  Use albuterol in am and see if relief.  Negative stress test may not mean cardiac so if she is still having sx and albuterol is not helping, she needs to call her cardiologist in [place]  Will do CXR at patient's request.   - XR CHEST PA LAT; Future    2. Osteoarthritis of both knees, unspecified osteoarthritis type  Pt would like to get surgery in [place] where family is if she needs it.   - REFERRAL TO ORTHOPEDICS    3. Pain and swelling of knee, unspecified laterality  No swelling today.  Use tylenol only  - XR KNEE LT MAX 2 VWS; Future  - XR KNEE RT MAX 2 VWS; Future    4. Urinary tract infection with hematuria, site unspecified  Start empirically but await culture.  Repeat urine in 2 weeks after treatment to make sure hematuria is gone   - nitrofurantoin, macrocrystal-monohydrate, (MACROBID) 100 mg capsule; Take 1 Cap by mouth two (2) times a day.  Dispense: 10 Cap; Refill: 0  - UA/M W/RFLX CULTURE, ROUTINE; Future    5. Hypothyroidism, unspecified type  Tolerating thyroid medication and takes on empty stomach.  Aware to watch for symptoms of overactive thyroid like heat intolerance, loose stools, increase appetite, and weight loss along with palpitations.   Pt informed if low thyroid symptoms, like cold intolerance, weight gain, hair loss, edema, and mental slowness, please call to get TSH rechecked.  Noncompliance of medications can lead to coma and severe mental status changes.  Stay on current regimen of  Synthroid and cytomel.  Followed by Dr. Luster LandsbergBerger    6. Chronic hyperglycemia  a1c is 5.5.  Start more resistance type exercise    7. Small airways disease  Try albuterol prior to exertion and  see if there is relief.            Visit Vitals  BP 128/72 (BP 1 Location: Left arm, BP Patient Position: Sitting)   Pulse 71   Temp 98 ??F (36.7 ??C) (Oral)   Resp 16   Ht 5\' 4"  (1.626 m)   Wt 176 lb (79.8 kg)   SpO2 99%   BMI 30.21 kg/m??       Historical Data    Past Medical History:   Diagnosis Date   ??? Arthritis     hands/knees   ??? Asthma    ??? Chronic obstructive pulmonary disease (HCC)    ??? Dysthymia 05/08/2015   ??? Family history of heart attack 05/08/2015   ??? GERD (gastroesophageal reflux disease)    ??? History of cholecystectomy 05/08/2015   ??? History of prediabetes 05/08/2015   ??? History of sinus bradycardia 05/08/2015   ??? Hx of diverticulitis of colon 05/08/2015   ??? Hypothyroidism 05/08/2015   ??? Pure hypercholesterolemia 05/08/2015   ??? 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 Thyroid disease        Past Surgical History:   Procedure Laterality Date   ??? COLONOSCOPY N/A 04/22/2017    COLONOSCOPY performed by Herbert Moors., MD at [facility]   ??? COLORECTAL SCRN; HI RISK IND  04/22/2017        ??? HX CATARACT REMOVAL Bilateral 2010    ??? HX CHOLECYSTECTOMY  2016   ??? HX OOPHORECTOMY Bilateral 2017   ??? HX OTHER SURGICAL  2012    colon polyp removed   ??? HX OTHER SURGICAL      ovarian cyst removed   ??? HX RETINAL DETACHMENT REPAIR Right 2008    tears   ??? HX TUBAL LIGATION     ??? UPPER GI ENDOSCOPY,BIOPSY  01/06/2015            Outpatient Encounter Medications as of 06/10/2017   Medication Sig Dispense Refill   ??? furosemide (LASIX) 20 mg tablet Take 20 mg by mouth.     ??? cholecalciferol (VITAMIN D3) 5,000 unit capsule Take 1 Cap by mouth.     ??? VENTOLIN HFA 90 mcg/actuation inhaler INHALE TWO PUFFS BY MOUTH EVERY 4 HOURS AS NEEDED FOR  WHEEZING 1 Inhaler 12   ??? Omega-3 Fatty Acids (FISH OIL) 500 mg cap Take  by mouth daily.     ??? pravastatin (PRAVACHOL) 40 mg tablet TAKE ONE TABLET BY MOUTH ONCE DAILY IN THE EVENING 30 Tab 12   ??? MONTELUKAST SODIUM (SINGULAIR PO) Take  by mouth daily.     ??? fexofenadine (ALLEGRA) 180 mg tablet Take  by mouth.     ??? levothyroxine (SYNTHROID) 112 mcg tablet Take 1 Tab by mouth Daily (before breakfast). (Patient taking differently: Take 100 mcg by mouth Daily (before breakfast).) 90 Tab 6   ??? liothyronine (CYTOMEL) 5 mcg tablet Take 1 Tab by mouth daily. 90 Tab 4   ??? omeprazole (PRILOSEC) 40 mg capsule Take 1 Cap by mouth daily. Indications: GASTROESOPHAGEAL REFLUX 90 Cap 6   ??? ascorbic acid (VITAMIN C) 1,000 mg tablet Take 1,000 mg by mouth daily. 2 daily     ??? calcium-magnesium-zinc Tab Take 1,000 mg by mouth daily.     ??? docusate sodium (COLACE) 100 mg capsule Take 1 Cap by mouth two (2) times a day. May use OTC instead. Prevents constipation from narcotics. (Patient taking differently: Take 100 mg by mouth two (2) times daily  as needed. May use OTC instead. Prevents constipation from narcotics.) 60 Cap 0     No facility-administered encounter medications on file as of 06/10/2017.         Allergies   Allergen Reactions   ??? Simvastatin Myalgia        Social History     Socioeconomic History   ??? Marital status: DIVORCED      Spouse name: Not on file   ??? Number of children: Not on file   ??? Years of education: Not on file   ??? Highest education level: Not on file   Social Needs   ??? Financial resource strain: Not on file   ??? Food insecurity - worry: Not on file   ??? Food insecurity - inability: Not on file   ??? Transportation needs - medical: Not on file   ??? Transportation needs - non-medical: Not on file   Occupational History   ??? Not on file   Tobacco Use   ??? Smoking status: Never Smoker   ??? Smokeless tobacco: Never Used   Substance and Sexual Activity   ??? Alcohol use: No   ??? Drug use: No   ??? Sexual activity: No   Other Topics Concern   ??? Not on file   Social History Narrative   ??? Not on file        family history includes Colon Cancer in her sister; Heart Disease in her brother, father, mother, and sister.     Review of Systems   Constitutional: Negative for weight loss.   Eyes: Negative for blurred vision.   Respiratory: Negative for shortness of breath.    Cardiovascular: Negative for chest pain.   Gastrointestinal: Negative for abdominal pain.   Genitourinary: Negative for dysuria and frequency.   Skin: Negative for rash.   Neurological: Negative for dizziness, focal weakness, weakness and headaches.   Endo/Heme/Allergies: Negative for environmental allergies. Does not bruise/bleed easily.         Physical Exam   Constitutional: She appears well-developed and well-nourished. She is active.  Non-toxic appearance. She does not have a sickly appearance. She does not appear ill. No distress.   Eyes: Conjunctivae are normal. Right eye exhibits no discharge.   Neck: Carotid bruit is not present. No thyroid mass and no thyromegaly present.   Cardiovascular: Normal rate, regular rhythm, S1 normal, S2 normal, normal heart sounds and normal pulses. Exam reveals no gallop and no friction rub.   Pulmonary/Chest: Effort normal and breath sounds normal. No respiratory distress.    Abdominal: Soft. Bowel sounds are normal. She exhibits no distension.   Musculoskeletal: She exhibits edema (mild one plus). She exhibits no deformity.   Lymphadenopathy:     She has no cervical adenopathy.   Neurological: She is alert. Coordination normal.   Skin: Skin is warm and dry. No rash noted. No pallor.   Psychiatric: She has a normal mood and affect. Her behavior is normal.   Vitals reviewed.     Ortho Exam      Orders Placed This Encounter   ??? furosemide (LASIX) 20 mg tablet     Sig: Take 20 mg by mouth.   ??? cholecalciferol (VITAMIN D3) 5,000 unit capsule     Sig: Take 1 Cap by mouth.        I have reviewed the patient's medical history in detail and updated the computerized patient record.     We had a prolonged discussion about these complex clinical issues and  went over the various important aspects to consider. All questions were answered.     Advised her to call back or return to office if symptoms do not improve, change in nature, or persist.    She was given an after visit summary or informed of MyChart Access which includes patient instructions, diagnoses, current medications, & vitals.  She expressed understanding with the diagnosis and plan.

## 2017-06-10 NOTE — Patient Instructions (Signed)
Instruction on how to use steam to improve congestion    Put two tbs of baking soda in a pot (quart) of water and heat to steam.  Take off fire and place face over steam with towel over your head and pot.  Allow congestion to come out of nasal passages and then come out of towel to blow your nose.  Return into the steam tent.  It also will help more effectively to put a few drops of peppermint oil or eucalyptus in the mixture.  They key is to allow everything stuck in your sinuses and nose to come out so it is not a medium for infection.

## 2017-06-10 NOTE — Progress Notes (Signed)
Called patient.  UTI persisting.  Will change to cipro.   Will call in cipro 250 mg bid x 3 days  Having some urinary frequency

## 2017-06-10 NOTE — Progress Notes (Signed)
Leslie Potter is a 77 y.o. female    Chief Complaint   Patient presents with   ??? Follow-up     6 month     1. Have you been to the ER, urgent care clinic since your last visit?  Hospitalized since your last visit?No    2. Have you seen or consulted any other health care providers outside of the [facility] since your last visit?  Include any pap smears or colon screening. No     Visit Vitals  BP 128/72 (BP 1 Location: Left arm, BP Patient Position: Sitting)   Pulse 71   Temp 98 ??F (36.7 ??C) (Oral)   Resp 16   Ht 5\' 4"  (1.626 m)   Wt 176 lb (79.8 kg)   SpO2 99%   BMI 30.21 kg/m??     Health Maintenance Due   Topic Date Due   ??? Shingrix Vaccine Age 77> (1 of 2) 01/[birth year]/1989     Cloyd StagersNicole Baker, LPN

## 2017-06-11 ENCOUNTER — Encounter

## 2017-06-11 ENCOUNTER — Inpatient Hospital Stay: Admit: 2017-06-11 | Payer: MEDICARE | Primary: Internal Medicine

## 2017-06-11 DIAGNOSIS — M170 Bilateral primary osteoarthritis of knee: Secondary | ICD-10-CM

## 2017-06-11 NOTE — Progress Notes (Signed)
Chest xray is normal

## 2017-06-11 NOTE — Progress Notes (Signed)
xrays show mild to moderate arthritis rather than severe.  Perhaps you should see the ortho doctor I gave you to discuss options.  Message sent to patient via mychart:  June 11, 2017

## 2017-06-13 LAB — CBC WITH AUTOMATED DIFF
ABS. BASOPHILS: 0 10*3/uL (ref 0.0–0.2)
ABS. EOSINOPHILS: 0.1 10*3/uL (ref 0.0–0.4)
ABS. IMM. GRANS.: 0 10*3/uL (ref 0.0–0.1)
ABS. MONOCYTES: 0.6 10*3/uL (ref 0.1–0.9)
ABS. NEUTROPHILS: 3.9 10*3/uL (ref 1.4–7.0)
Abs Lymphocytes: 1.8 10*3/uL (ref 0.7–3.1)
BASOPHILS: 0 %
EOSINOPHILS: 1 %
HCT: 42.4 % (ref 34.0–46.6)
HGB: 14.6 g/dL (ref 11.1–15.9)
IMMATURE GRANULOCYTES: 0 %
Lymphocytes: 28 %
MCH: 30.9 pg (ref 26.6–33.0)
MCHC: 34.4 g/dL (ref 31.5–35.7)
MCV: 90 fL (ref 79–97)
MONOCYTES: 10 %
NEUTROPHILS: 61 %
PLATELET: 289 10*3/uL (ref 150–379)
RBC: 4.72 x10E6/uL (ref 3.77–5.28)
RDW: 13.6 % (ref 12.3–15.4)
WBC: 6.5 10*3/uL (ref 3.4–10.8)

## 2017-06-13 LAB — URINE CULTURE, ROUTINE

## 2017-06-13 LAB — MICROSCOPIC EXAMINATION
Casts: NONE SEEN /lpf
WBC: >30 /hpf — AB (ref 0–?)

## 2017-06-13 LAB — UA/M W/RFLX CULTURE, ROUTINE
Bilirubin: NEGATIVE
Glucose: NEGATIVE
Ketone: NEGATIVE
Nitrites: POSITIVE — AB
Protein: NEGATIVE
Specific Gravity: 1.017 (ref 1.005–1.030)
Urobilinogen: 0.2 mg/dL (ref 0.2–1.0)
pH (UA): 6 (ref 5.0–7.5)

## 2017-06-13 LAB — METABOLIC PANEL, COMPREHENSIVE
A-G Ratio: 1.7 (ref 1.2–2.2)
ALT (SGPT): 22 IU/L (ref 0–32)
AST (SGOT): 21 IU/L (ref 0–40)
Albumin: 4.1 g/dL (ref 3.5–4.8)
Alk. phosphatase: 69 IU/L (ref 39–117)
BUN/Creatinine ratio: 16 (ref 12–28)
BUN: 11 mg/dL (ref 8–27)
Bilirubin, total: 0.5 mg/dL (ref 0.0–1.2)
CO2: 24 mmol/L (ref 20–29)
Calcium: 9.1 mg/dL (ref 8.7–10.3)
Chloride: 105 mmol/L (ref 96–106)
Creatinine: 0.68 mg/dL (ref 0.57–1.00)
GFR est AA: 97 mL/min/{1.73_m2} (ref >59)
GFR est non-AA: 84 mL/min/{1.73_m2} (ref >59)
GLOBULIN, TOTAL: 2.4 g/dL (ref 1.5–4.5)
Glucose: 105 mg/dL — ABNORMAL HIGH (ref 65–99)
Potassium: 4.5 mmol/L (ref 3.5–5.2)
Protein, total: 6.5 g/dL (ref 6.0–8.5)
Sodium: 143 mmol/L (ref 134–144)

## 2017-06-13 LAB — LIPID PANEL
Cholesterol, total: 199 mg/dL (ref 100–199)
HDL Cholesterol: 52 mg/dL (ref >39)
LDL, calculated: 113 mg/dL — ABNORMAL HIGH (ref 0–99)
Triglyceride: 171 mg/dL — ABNORMAL HIGH (ref 0–149)
VLDL, calculated: 34 mg/dL (ref 5–40)

## 2017-06-13 LAB — CVD REPORT

## 2017-06-13 LAB — HEMOGLOBIN A1C WITH EAG
Estimated average glucose: 111 mg/dL
Hemoglobin A1c: 5.5 % (ref 4.8–5.6)

## 2017-06-13 LAB — MICROALBUMIN, UR, RAND W/ MICROALB/CREAT RATIO
Creatinine, urine random: 110 mg/dL
Microalb/Creat ratio (ug/mg creat.): 22.5 mg/g creat (ref 0.0–30.0)
Microalbumin, urine: 24.8 ug/mL

## 2017-06-13 LAB — VITAMIN D, 25 HYDROXY: VITAMIN D, 25-HYDROXY: 42.6 ng/mL (ref 30.0–100.0)

## 2017-06-13 LAB — TSH REFLEX TO T4: TSH: 2.78 u[IU]/mL (ref 0.450–4.500)

## 2017-06-13 LAB — PULMONARY FUNCTION TEST: FEV1: 1 L

## 2017-06-20 ENCOUNTER — Inpatient Hospital Stay: Admit: 2017-08-15 | Payer: MEDICARE | Primary: Internal Medicine

## 2017-06-20 DIAGNOSIS — N390 Urinary tract infection, site not specified: Secondary | ICD-10-CM

## 2017-06-22 LAB — UA/M W/RFLX CULTURE, ROUTINE
Bilirubin: NEGATIVE
Glucose: NEGATIVE
Ketone: NEGATIVE
Nitrites: NEGATIVE
Protein: NEGATIVE
Specific Gravity: 1.007 (ref 1.005–1.030)
Urobilinogen: 0.2 mg/dL (ref 0.2–1.0)
pH (UA): 7 (ref 5.0–7.5)

## 2017-06-22 LAB — MICROSCOPIC EXAMINATION: Casts: NONE SEEN /lpf

## 2017-06-22 LAB — URINE CULTURE, ROUTINE

## 2017-06-24 MED ORDER — CIPROFLOXACIN 250 MG TAB
250 mg | ORAL_TABLET | Freq: Two times a day (BID) | ORAL | 0 refills | Status: DC
Start: 2017-06-24 — End: 2017-09-30

## 2017-08-06 NOTE — Telephone Encounter (Signed)
PCP: Sonia SidePang, Richard Y, MD    Last appt: 06/10/2017  No future appointments.    Requested Prescriptions     Pending Prescriptions Disp Refills   ??? pravastatin (PRAVACHOL) 40 mg tablet 90 Tab 3     Patient request 90 day supply.

## 2017-08-07 MED ORDER — PRAVASTATIN 40 MG TAB
40 mg | ORAL_TABLET | Freq: Every day | ORAL | 1 refills | Status: DC
Start: 2017-08-07 — End: 2017-09-30

## 2017-09-30 ENCOUNTER — Ambulatory Visit: Admit: 2017-09-30 | Discharge: 2017-09-30 | Payer: MEDICARE | Attending: Specialist | Primary: Internal Medicine

## 2017-09-30 ENCOUNTER — Encounter

## 2017-09-30 DIAGNOSIS — I2510 Atherosclerotic heart disease of native coronary artery without angina pectoris: Secondary | ICD-10-CM

## 2017-09-30 NOTE — Progress Notes (Signed)
LAST OFFICE VISIT : Visit date not found        ICD-10-CM ICD-9-CM   1. Coronary artery disease involving native coronary artery of native heart without angina pectoris I25.10 414.01            Leslie Potter is a 77 y.o. female with hypercholesterolemia and family hx of MI, dyslipidemia referred for re-establish cardiologist.      Cardiac risk factors: dyslipidemia, obesity, post-menopausal, family hx  I have personally obtained the history from the patient.    HISTORY OF PRESENTING ILLNESS     Pt was followed by cardiologist in [place] for CAD and is here to reestablish with a cardiologist.  She had a Myoview that indicated low risk.   She had intermittent edema and was given lasix 20 mg PRN.  She's no longer on her cholesterol lowering medication since January due to leg aches.  Pt does take Coenzyme Q10 however.  Pt states that she did have some difficulty with breathing and it was hot at the time, where she was then given an allergy shot that relieved her sx's.  Pt was also told that she may have mild COPD and asthma and was given inhaler and experienced allergic reaction.  Pt had hx of heart attack in 2010 in [place], [place], and was also experiencing chest discomfort and SOB at the time.  Pt then experienced SOB back in October 2018 and had another stress test that showed normal results and low risk.  Pt is also going to see nutritionist and would like to work on lowering her LDL via risk factor modifications.  Pt uses electric bed that elevates her, but she has worsening edema throughout the day.  Pt's blood sugar has been going up in the past few months.  Pt is a nonsmoker.  Pt does not exercise as she feels she's out of breath. Pt has done calcium scoring 2006 and was 11.  Pt adds that she's also having dizziness from time to time but she's unsure if this is cardiac related or aging.  Pt adds that she also had a carotid recently and it showed normal  results. Pt has strong family hx of heart attack from her brother, sister and her father, but she adds that her siblings were heavy smoker.     The patient denies chest pain, orthopnea, PND, palpitations, syncope, presyncope or fatigue.         ACTIVE PROBLEM LIST     Patient Active Problem List    Diagnosis Date Noted   ??? 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 Hypothyroidism 05/08/2015   ??? Pure hypercholesterolemia 05/08/2015   ??? History of sinus bradycardia 05/08/2015   ??? Family history of heart attack 05/08/2015   ??? History of prediabetes 05/08/2015   ??? Hx of diverticulitis of colon 05/08/2015   ??? History of cholecystectomy 05/08/2015   ??? Dysthymia 05/08/2015           PAST MEDICAL HISTORY     Past Medical History:   Diagnosis Date   ??? Arthritis     hands/knees   ??? Asthma    ??? Chronic obstructive pulmonary disease ([place])    ??? Dysthymia 05/08/2015   ??? Family history of heart attack 05/08/2015   ??? GERD (gastroesophageal reflux disease)    ??? History of cholecystectomy 05/08/2015   ??? History of prediabetes 05/08/2015   ??? History of sinus bradycardia 05/08/2015   ??? Hx of diverticulitis of colon 05/08/2015   ???  Hypothyroidism 05/08/2015   ??? Pure hypercholesterolemia 05/08/2015   ??? 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 Thyroid disease            PAST SURGICAL HISTORY     Past Surgical History:   Procedure Laterality Date   ??? COLONOSCOPY N/A 04/22/2017    COLONOSCOPY performed by Eugenie Filler., MD at [facility]   ??? COLORECTAL SCRN; HI RISK IND  04/22/2017        ??? HX CATARACT REMOVAL Bilateral 2010   ??? HX CHOLECYSTECTOMY  2016   ??? HX OOPHORECTOMY Bilateral 2017   ??? HX OTHER SURGICAL  2012    colon polyp removed   ??? HX OTHER SURGICAL      ovarian cyst removed   ??? HX RETINAL DETACHMENT REPAIR Right 2008    tears   ??? HX TUBAL LIGATION     ??? UPPER GI ENDOSCOPY,BIOPSY  01/06/2015               ALLERGIES     Allergies   Allergen Reactions   ??? Simvastatin Myalgia          FAMILY HISTORY     Family History    Problem Relation Age of Onset   ??? Heart Disease Mother    ??? Heart Disease Father    ??? Heart Disease Sister    ??? Colon Cancer Sister    ??? Heart Disease Brother     negative for cardiac disease       SOCIAL HISTORY     Social History     Socioeconomic History   ??? Marital status: DIVORCED     Spouse name: Not on file   ??? Number of children: Not on file   ??? Years of education: Not on file   ??? Highest education level: Not on file   Tobacco Use   ??? Smoking status: Never Smoker   ??? Smokeless tobacco: Never Used   Substance and Sexual Activity   ??? Alcohol use: No   ??? Drug use: No   ??? Sexual activity: Never         MEDICATIONS     Current Outpatient Medications   Medication Sig   ??? docusate sodium (COLACE) 100 mg capsule Take 100 mg by mouth as needed for Constipation.   ??? levothyroxine (SYNTHROID) 100 mcg tablet Take  by mouth Daily (before breakfast).   ??? OTHER Allergy shots - weekly.   ??? furosemide (LASIX) 20 mg tablet Take 20 mg by mouth as needed.   ??? cholecalciferol (VITAMIN D3) 5,000 unit capsule Take 1 Cap by mouth.   ??? VENTOLIN HFA 90 mcg/actuation inhaler INHALE TWO PUFFS BY MOUTH EVERY 4 HOURS AS NEEDED FOR  WHEEZING   ??? Omega-3 Fatty Acids (FISH OIL) 500 mg cap Take  by mouth as needed.   ??? MONTELUKAST SODIUM (SINGULAIR PO) Take  by mouth daily.   ??? fexofenadine (ALLEGRA) 180 mg tablet Take  by mouth daily.   ??? liothyronine (CYTOMEL) 5 mcg tablet Take 1 Tab by mouth daily.   ??? omeprazole (PRILOSEC) 40 mg capsule Take 1 Cap by mouth daily. Indications: GASTROESOPHAGEAL REFLUX   ??? ascorbic acid (VITAMIN C) 1,000 mg tablet Take 1,000 mg by mouth as needed.   ??? calcium-magnesium-zinc Tab Take 1,000 mg by mouth as needed.     No current facility-administered medications for this visit.        I have reviewed the nurses notes, vitals, problem list,  allergy list, medical history, family, social history and medications.       REVIEW OF SYMPTOMS      General: Pt denies excessive weight gain or loss. Pt is able to conduct  ADL's  HEENT: Denies blurred vision, headaches, hearing loss, epistaxis and difficulty swallowing.  Respiratory: Denies cough, congestion, SOB, wheezing or stridor.  Positive for DOE.   Cardiovascular: Denies precordial pain, palpitations, or PND. Positive for LE edema.   Gastrointestinal: Denies poor appetite, indigestion, abdominal pain or blood in stool  Genitourinary: Denies hematuria, dysuria, increased urinary frequency  Musculoskeletal: Denies joint pain or swelling from muscles or joints  Neurologic: Denies tremor, paresthesias, headache, or sensory motor disturbance  Psychiatric: Denies confusion, insomnia, depression  Integumentray: Denies rash, itching or ulcers.  Hematologic: Denies easy bruising, bleeding     PHYSICAL EXAMINATION      Vitals:    09/30/17 1117   BP: 120/88   Pulse: 64   Resp: 16   Weight: 179 lb 12.8 oz (81.6 kg)   Height: 5' 4"  (1.626 m)     General: Well developed, in no acute distress.  HEENT: No jaundice, oral mucosa moist, no oral ulcers  Neck: Supple, no stiffness, no lymphadenopathy, supple  Heart: ??Normal S1/S2 negative S3 or S4. Regular, no murmur, gallop or rub, no jugular venous distention  Respiratory: Clear bilaterally x 4, no wheezing or rales  Abdomen:?? ??Soft, non-tender, bowel sounds are active.??  Extremities:  No edema, normal cap refill, no cyanosis.  Musculoskeletal: No clubbing, no deformities  Neuro: A&Ox3, speech clear, gait stable, cooperative, no focal neurologic deficits  Skin: Skin color is normal. No rashes or lesions. Non diaphoretic, moist.  Vascular: 2+ pulses symmetric in all extremities        EKG: SB     DIAGNOSTIC DATA     1. Stress Test  Lexiscan- 01/02/2016- no perfusion defects consistent with prior infarction or current ischemia, EF (55-65%).    2. Echo   12/10/12- EF 60-65%  07/14/14- EF 60%, LAE mild    3. Lipids  06/09/17- TC 199, HDL 52, LDL 113, TG 171         LABORATORY DATA            Lab Results   Component Value Date/Time     WBC 6.5 06/09/2017 09:33 AM    HGB 14.6 06/09/2017 09:33 AM    HCT 42.4 06/09/2017 09:33 AM    PLATELET 289 06/09/2017 09:33 AM    MCV 90 06/09/2017 09:33 AM      Lab Results   Component Value Date/Time    Sodium 143 06/09/2017 09:33 AM    Potassium 4.5 06/09/2017 09:33 AM    Chloride 105 06/09/2017 09:33 AM    CO2 24 06/09/2017 09:33 AM    Anion gap 5 09/08/2008 02:40 PM    Glucose 105 (H) 06/09/2017 09:33 AM    BUN 11 06/09/2017 09:33 AM    Creatinine 0.68 06/09/2017 09:33 AM    BUN/Creatinine ratio 16 06/09/2017 09:33 AM    GFR est AA 97 06/09/2017 09:33 AM    GFR est non-AA 84 06/09/2017 09:33 AM    Calcium 9.1 06/09/2017 09:33 AM    Bilirubin, total 0.5 06/09/2017 09:33 AM    AST (SGOT) 21 06/09/2017 09:33 AM    Alk. phosphatase 69 06/09/2017 09:33 AM    Protein, total 6.5 06/09/2017 09:33 AM    Albumin 4.1 06/09/2017 09:33 AM    Globulin 3.1 09/08/2008 02:40 PM  A-G Ratio 1.7 06/09/2017 09:33 AM    ALT (SGPT) 22 06/09/2017 09:33 AM           ASSESSMENT/RECOMMENDATIONS:.      1. Mild CAD  - She's not experiencing any chest discomfort but is having some SOB that appears though to be stable.  Last Echo demonstrated normal EF and she's being treated medically.  Her last stress test was also normal.  I don't favor proceeding with any cardiac testing at this time and she'll let me know if her SOB worsens.  She states that her last calcium score was 11 and would like to repeat that so gave her information about calcium scoring.   2. Edema on Lasix 20 mg PRN   - PE today however showed no pitting edema even though she states that she has worsening edema throughout the day.   3. Dyslipidemia on Pravachol 40 mg  - Last LDL was 113 which is not at goal.  Goal should be 100 or below. TG is also elevated at 171, counseled on low-carb diet.   - She's not interested of being on any statins secondary to leg aching and would like to work on diet.  She's seeing a nutritionist.     - She states that her last calcium score was 11 back in 2006.  Again gave her the information about calcium scoring.   - Also informed her the importance of dental hygiene.   4. Hypothyroidism on synthroid  - Followed by PCP.   5. Return in 3 months or PRN.    Orders Placed This Encounter   ??? AMB POC EKG ROUTINE W/ 12 LEADS, INTER & REP     Order Specific Question:   Reason for Exam:     Answer:   cad   ??? docusate sodium (COLACE) 100 mg capsule     Sig: Take 100 mg by mouth as needed for Constipation.   ??? levothyroxine (SYNTHROID) 100 mcg tablet     Sig: Take  by mouth Daily (before breakfast).   ??? OTHER     Sig: Allergy shots - weekly.              I have discussed the diagnosis with  Deno Lunger Wack and the intended plan as seen in the above orders.  Questions were answered concerning future plans.  I have discussed medication side effects and warnings with the patient as well.    Thank you,  Sandi Raveling, MD for involving me in the care of  [place]. Please do not hesitate to contact me for further questions/concerns.     Written by Mercie Eon, [place], as dictated by Vennie Homans, MD.     Jeannette HowDoloresco,  MD, [facility]    Patient Care Team:  Sandi Raveling, MD as PCP - General (Internal Medicine)  Sheral Flow Grayling Congress, MD as Surgeon (General Surgery)    [facility]      [address]     [place], [place]      [phone] / [phone] Fax

## 2017-09-30 NOTE — Progress Notes (Signed)
Visit Vitals  BP 120/88 (BP 1 Location: Right arm)   Pulse 64   Resp 16   Ht  (1.626 m)   Wt 179 lb 12.8 oz (81.6 kg)   BMI 30.86 kg/m??       New patient. Here to establish care.

## 2017-10-01 ENCOUNTER — Ambulatory Visit

## 2017-10-01 ENCOUNTER — Inpatient Hospital Stay: Admit: 2017-10-01 | Payer: Self-pay | Attending: Specialist | Primary: Internal Medicine

## 2017-10-01 DIAGNOSIS — Z136 Encounter for screening for cardiovascular disorders: Secondary | ICD-10-CM

## 2017-10-02 NOTE — Other (Signed)
Reached patient by phone and shared her coronary artery calcium score of 29 with her.  We discussed the meaning of this score.  Patient had an appointment with Dr. Lorella Nimrod yesterday and states she is a retired Engineer, civil (consulting).  She declines a discussion of her cardiac risk factors at this time.  Patient plans to follow up with her PCP.  Patient has no further questions at this time.

## 2017-12-11 ENCOUNTER — Encounter

## 2017-12-16 ENCOUNTER — Inpatient Hospital Stay: Payer: MEDICARE | Attending: Internal Medicine | Primary: Internal Medicine

## 2017-12-16 NOTE — Telephone Encounter (Signed)
Patient is returning your call regarding the order that was put in for the patient to have a holter monitor. Patient states that if she cant have a monitor mailed she will wait until her appointment on 9/3. Thanks!    Phone: [phone]

## 2017-12-16 NOTE — Telephone Encounter (Signed)
Patient stated that her PCP recommends that she wear a holter monitor and suggests that she have our office mail it to her, being that she lives elsewhere. Please advise.    Phone #: [phone]  Thanks

## 2017-12-16 NOTE — Telephone Encounter (Signed)
L/m for pt to inform her that we can not mail a 24 hr or a 48 hr holter.  The only type that can be mailed is a 7-30 day loop.  She will have to come into the office & it has to be returned to the office.      If the pts PCP wants her to do the 7-30 day loop they will need to change to the order to the loop & note the number of days and send to the office.

## 2017-12-16 NOTE — Telephone Encounter (Signed)
Spoke to pt about the holter her PCP ordered.  Explained we cant mail the holter and it is up to her Dr if it is ok with them to wait until Sept.  Told her to talk to their office to see if ok waiting or switch to the loop so it can be mailed.  Pt stated she just wants to wait & will let them know.

## 2018-01-20 ENCOUNTER — Encounter

## 2018-01-20 ENCOUNTER — Institutional Professional Consult (permissible substitution): Admit: 2018-01-20 | Discharge: 2018-01-20 | Payer: MEDICARE | Primary: Internal Medicine

## 2018-01-20 ENCOUNTER — Encounter: Attending: Specialist | Primary: Internal Medicine

## 2018-01-20 DIAGNOSIS — R001 Bradycardia, unspecified: Secondary | ICD-10-CM

## 2018-01-20 NOTE — Progress Notes (Signed)
Applied 24 hr holter per Dr Larey Days  &  Dr Lorella Nimrod dx: brady.  Pt has #[id number] & due back on 9/4.  Chargeable visit.  [employer]

## 2018-01-20 NOTE — Progress Notes (Signed)
Applied 24 hr holter per Dr Sara Mears  &  Dr Doloresco dx: brady.  Pt has #[id number] & due back on 9/4.  Chargeable visit.  [employer]

## 2018-01-21 ENCOUNTER — Ambulatory Visit

## 2018-01-21 ENCOUNTER — Ambulatory Visit: Attending: Specialist | Primary: Internal Medicine

## 2018-01-21 ENCOUNTER — Ambulatory Visit: Admit: 2018-01-21 | Discharge: 2018-01-21 | Payer: MEDICARE | Primary: Internal Medicine

## 2018-01-21 ENCOUNTER — Ambulatory Visit: Admit: 2018-01-21 | Discharge: 2018-01-21 | Payer: MEDICARE | Attending: Specialist | Primary: Internal Medicine

## 2018-01-21 ENCOUNTER — Encounter

## 2018-01-21 DIAGNOSIS — E7800 Pure hypercholesterolemia, unspecified: Secondary | ICD-10-CM

## 2018-01-21 DIAGNOSIS — R5383 Other fatigue: Secondary | ICD-10-CM

## 2018-01-21 NOTE — Progress Notes (Signed)
LAST OFFICE VISIT : 09/30/17        ICD-10-CM ICD-9-CM   1. Pure hypercholesterolemia E78.00 272.0   2. Dyslipidemia E78.5 272.4   3. Bradycardia R00.1 427.89   4. Fatigue, unspecified type R53.83 780.79            Leslie Potter is a 77 y.o. female with hypercholesterolemia and family hx of MI, dyslipidemia last seen by me 4 months ago.    Cardiac risk factors: dyslipidemia, obesity, post-menopausal, family hx  I have personally obtained the history from the patient.    HISTORY OF PRESENTING ILLNESS     Currently not on any negative chronotropic agents.      Pt was followed by cardiologist in [place] for CAD and is here to reestablish with a cardiologist.  She had a Myoview that indicated low risk.   She had intermittent edema and was given lasix 20 mg PRN.  She's no longer on her cholesterol lowering medication since January due to leg aches.  Pt does take Coenzyme Q10 however.  Pt states that she did have some difficulty with breathing and it was hot at the time, where she was then given an allergy shot that relieved her sx's.  Pt was also told that she may have mild COPD and asthma and was given inhaler and experienced allergic reaction.  Pt had hx of heart attack in 2010 in [place], [place], and was also experiencing chest discomfort and SOB at the time.  Pt then experienced SOB back in October 2018 and had another stress test that showed normal results and low risk.  Pt is also going to see nutritionist and would like to work on lowering her LDL via risk factor modifications.  Pt uses electric bed that elevates her, but she has worsening edema throughout the day.  Pt's blood sugar has been going up in the past few months.  Pt is a nonsmoker.  Pt does not exercise as she feels she's out of breath. Pt has done calcium scoring 2006 and was 11.  Pt adds that she's also having dizziness from time to time but she's unsure if this is cardiac related or  aging.  Pt adds that she also had a carotid recently and it showed normal results. Pt has strong family hx of heart attack from her brother, sister and her father, but she adds that her siblings were heavy smoker.     The patient denies chest pain, orthopnea, PND, palpitations, syncope, presyncope or fatigue.         ACTIVE PROBLEM LIST     Patient Active Problem List    Diagnosis Date Noted   ??? 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 Hypothyroidism 05/08/2015   ??? Pure hypercholesterolemia 05/08/2015   ??? History of sinus bradycardia 05/08/2015   ??? Family history of heart attack 05/08/2015   ??? History of prediabetes 05/08/2015   ??? Hx of diverticulitis of colon 05/08/2015   ??? History of cholecystectomy 05/08/2015   ??? Dysthymia 05/08/2015           PAST MEDICAL HISTORY     Past Medical History:   Diagnosis Date   ??? Arthritis     hands/knees   ??? Asthma    ??? Chronic obstructive pulmonary disease ([place])    ??? Dysthymia 05/08/2015   ??? Family history of heart attack 05/08/2015   ??? GERD (gastroesophageal reflux disease)    ??? History of cholecystectomy 05/08/2015   ??? History of prediabetes 05/08/2015   ???  History of sinus bradycardia 05/08/2015   ??? Hx of diverticulitis of colon 05/08/2015   ??? Hypothyroidism 05/08/2015   ??? Pure hypercholesterolemia 05/08/2015   ??? 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 Thyroid disease            PAST SURGICAL HISTORY     Past Surgical History:   Procedure Laterality Date   ??? COLONOSCOPY N/A 04/22/2017    COLONOSCOPY performed by Eugenie Filler., MD at [facility]   ??? COLORECTAL SCRN; HI RISK IND  04/22/2017        ??? HX CATARACT REMOVAL Bilateral 2010   ??? HX CHOLECYSTECTOMY  2016   ??? HX OOPHORECTOMY Bilateral 2017   ??? HX OTHER SURGICAL  2012    colon polyp removed   ??? HX OTHER SURGICAL      ovarian cyst removed   ??? HX RETINAL DETACHMENT REPAIR Right 2008    tears   ??? HX TUBAL LIGATION     ??? UPPER GI ENDOSCOPY,BIOPSY  01/06/2015               ALLERGIES     Allergies   Allergen Reactions    ??? Simvastatin Myalgia          FAMILY HISTORY     Family History   Problem Relation Age of Onset   ??? Heart Disease Mother    ??? Heart Disease Father    ??? Heart Disease Sister    ??? Colon Cancer Sister    ??? Heart Disease Brother     negative for cardiac disease       SOCIAL HISTORY     Social History     Socioeconomic History   ??? Marital status: DIVORCED     Spouse name: Not on file   ??? Number of children: Not on file   ??? Years of education: Not on file   ??? Highest education level: Not on file   Tobacco Use   ??? Smoking status: Never Smoker   ??? Smokeless tobacco: Never Used   Substance and Sexual Activity   ??? Alcohol use: No   ??? Drug use: No   ??? Sexual activity: Never         MEDICATIONS     Current Outpatient Medications   Medication Sig   ??? terbinafine HCl (LAMISIL) 250 mg tablet Take 250 mg by mouth daily.   ??? traZODone (DESYREL) 50 mg tablet Take  by mouth nightly.   ??? docusate sodium (COLACE) 100 mg capsule Take 100 mg by mouth as needed for Constipation.   ??? levothyroxine (SYNTHROID) 100 mcg tablet Take 112 mcg by mouth every other day.   ??? OTHER Allergy shots - weekly.   ??? furosemide (LASIX) 20 mg tablet Take 20 mg by mouth as needed.   ??? cholecalciferol (VITAMIN D3) 5,000 unit capsule Take 1 Cap by mouth every Tuesday Thursday, Saturday.   ??? VENTOLIN HFA 90 mcg/actuation inhaler INHALE TWO PUFFS BY MOUTH EVERY 4 HOURS AS NEEDED FOR  WHEEZING   ??? Omega-3 Fatty Acids (FISH OIL) 500 mg cap Take  by mouth as needed.   ??? MONTELUKAST SODIUM (SINGULAIR PO) Take  by mouth daily.   ??? fexofenadine (ALLEGRA) 180 mg tablet Take  by mouth daily.   ??? liothyronine (CYTOMEL) 5 mcg tablet Take 1 Tab by mouth daily.   ??? omeprazole (PRILOSEC) 40 mg capsule Take 1 Cap by mouth daily. Indications: GASTROESOPHAGEAL REFLUX   ??? ascorbic acid (VITAMIN  C) 1,000 mg tablet Take 1,000 mg by mouth as needed.   ??? calcium-magnesium-zinc Tab Take 1,000 mg by mouth as needed.     No current facility-administered medications for this visit.         I have reviewed the nurses notes, vitals, problem list, allergy list, medical history, family, social history and medications.       REVIEW OF SYMPTOMS      General: Pt denies excessive weight gain or loss. Pt is able to conduct ADL's  HEENT: Denies blurred vision, headaches, hearing loss, epistaxis and difficulty swallowing.  Respiratory: Denies cough, congestion, SOB, wheezing or stridor.  Positive for DOE.   Cardiovascular: Denies precordial pain, palpitations, or PND. Positive for LE edema.   Gastrointestinal: Denies poor appetite, indigestion, abdominal pain or blood in stool  Genitourinary: Denies hematuria, dysuria, increased urinary frequency  Musculoskeletal: Denies joint pain or swelling from muscles or joints  Neurologic: Denies tremor, paresthesias, headache, or sensory motor disturbance  Psychiatric: Denies confusion, insomnia, depression  Integumentray: Denies rash, itching or ulcers.  Hematologic: Denies easy bruising, bleeding     PHYSICAL EXAMINATION      Vitals:    01/21/18 1317   BP: 114/68   Pulse: (!) 48   SpO2: 96%   Weight: 179 lb (81.2 kg)   Height: 5' 4"  (1.626 m)     General: Well developed, in no acute distress.  HEENT: No jaundice, oral mucosa moist, no oral ulcers  Neck: Supple, no stiffness, no lymphadenopathy, supple  Heart: +bradycardia  Respiratory: Clear bilaterally x 4, no wheezing or rales  Abdomen:?? ??Soft, non-tender, bowel sounds are active.??  Extremities:  No edema, normal cap refill, no cyanosis.  Musculoskeletal: No clubbing, no deformities  Neuro: A&Ox3, speech clear, gait stable, cooperative, no focal neurologic deficits  Skin: Skin color is normal. No rashes or lesions. Non diaphoretic, moist.  Vascular: 2+ pulses symmetric in all extremities        EKG: SB     DIAGNOSTIC DATA     1. Stress Test  Lexiscan- 01/02/2016- no perfusion defects consistent with prior infarction or current ischemia, EF (55-65%).    2. Echo   12/10/12- EF 60-65%  07/14/14- EF 60%, LAE mild     3. Lipids  06/09/17- TC 199, HDL 52, LDL 113, TG 171  09/30/17- TC 238, HDL 49, LDL 128, TG 307  01/06/18- TC 220, HDL 48, LDL 119, TG 264    4. Calcium Score  10/01/17 -  The coronary calcium in each vessel is as follows:  ??  Left main coronary artery: 0  Left anterior descending coronary artery: 28  Left circumflex coronary artery: 1  Right coronary artery: 0  Posterior descending coronary artery: 0  ??  Total calcium score: 29         LABORATORY DATA            Lab Results   Component Value Date/Time    WBC 6.5 06/09/2017 09:33 AM    HGB 14.6 06/09/2017 09:33 AM    HCT 42.4 06/09/2017 09:33 AM    PLATELET 289 06/09/2017 09:33 AM    MCV 90 06/09/2017 09:33 AM      Lab Results   Component Value Date/Time    Sodium 143 06/09/2017 09:33 AM    Potassium 4.5 06/09/2017 09:33 AM    Chloride 105 06/09/2017 09:33 AM    CO2 24 06/09/2017 09:33 AM    Anion gap 5 09/08/2008 02:40 PM  Glucose 105 (H) 06/09/2017 09:33 AM    BUN 11 06/09/2017 09:33 AM    Creatinine 0.68 06/09/2017 09:33 AM    BUN/Creatinine ratio 16 06/09/2017 09:33 AM    GFR est AA 97 06/09/2017 09:33 AM    GFR est non-AA 84 06/09/2017 09:33 AM    Calcium 9.1 06/09/2017 09:33 AM    Bilirubin, total 0.5 06/09/2017 09:33 AM    AST (SGOT) 21 06/09/2017 09:33 AM    Alk. phosphatase 69 06/09/2017 09:33 AM    Protein, total 6.5 06/09/2017 09:33 AM    Albumin 4.1 06/09/2017 09:33 AM    Globulin 3.1 09/08/2008 02:40 PM    A-G Ratio 1.7 06/09/2017 09:33 AM    ALT (SGPT) 22 06/09/2017 09:33 AM           ASSESSMENT/RECOMMENDATIONS:.      1. Bradycardia.  - She has been fatigued and dizzy at time.  - She is wearing a 24 hour Holter now to look for dysrhythmias   - Will order a walking stress test to see if we can get her HR up appropriately. If not, will order an echo.  - TSH was normal.    2. Dyslipidemia on Pravachol 40 mg  - close to goal  - On Pravachol every other night., I suggested she take it daily.     3. Fatigue     4. Possible OSA with periods of apnea per her daughter.   - Will get a sleep evaluation    Return in 6 weeks or PRN.    Orders Placed This Encounter   ??? terbinafine HCl (LAMISIL) 250 mg tablet     Sig: Take 250 mg by mouth daily.   ??? traZODone (DESYREL) 50 mg tablet     Sig: Take  by mouth nightly.          Follow-up and Dispositions  ??   Return in about 6 weeks (around 03/04/2018).           I have discussed the diagnosis with  Leslie Potter and the intended plan as seen in the above orders.  Questions were answered concerning future plans.  I have discussed medication side effects and warnings with the patient as well.    Thank you,  Eliberto Ivory, MD for involving me in the care of  Leslie Potter. Please do not hesitate to contact me for further questions/concerns.     Written by Maryan Rued, as dictated by Vennie Homans, MD.      Jeannette How.Amoree Newlon,  MD, [facility]    Patient Care Team:  Eliberto Ivory, MD as PCP - General (Internal Medicine)  Sheral Flow Grayling Congress, MD as Surgeon (General Surgery)  Kenton Kingfisher, MD (Endocrinology)    [facility]      [place], [place]     [place], [place]      [phone] / [phone] Fax

## 2018-01-21 NOTE — Progress Notes (Signed)
Progress Notes by Vennie Homans, MD at 01/21/18 1320                Author: Vennie Homans, MD  Service: --  Author Type: Physician       Filed: 01/22/18 0848  Encounter Date: 01/21/2018  Status: Signed          Editor: Vennie Homans, MD (Physician)                       LAST OFFICE VISIT : 09/30/17                  ICD-10-CM  ICD-9-CM          1.  Pure hypercholesterolemia  E78.00  272.0     2.  Dyslipidemia  E78.5  272.4     3.  Bradycardia  R00.1  427.89          4.  Fatigue, unspecified type  R53.83  780.79                  Leslie Potter is a 77 y.o.  female with hypercholesterolemia and family hx of MI, dyslipidemia last seen by me 4 months ago.      Cardiac risk factors: dyslipidemia, obesity, post-menopausal, family hx   I have personally obtained the history from the patient.        HISTORY OF PRESENTING ILLNESS        Currently not on any negative chronotropic agents.         Pt was followed by cardiologist in [place] for CAD and is here to reestablish with a cardiologist.  She had a Myoview that indicated low risk.   She had intermittent edema and was given lasix 20 mg PRN.  She's no longer on her cholesterol lowering medication  since January due to leg aches.  Pt does take Coenzyme Q10 however.  Pt states that she did have some difficulty with breathing and it was hot at the time, where she was then given an allergy shot that relieved her sx's.  Pt was also told that she may  have mild COPD and asthma and was given inhaler and experienced allergic reaction.  Pt had hx of heart attack in 2010 in [place], [place], and was also experiencing chest discomfort and SOB at the time.  Pt then experienced SOB back in October 2018 and had  another stress test that showed normal results and low risk.  Pt is also going to see nutritionist and would like to work on lowering her LDL via risk factor modifications.  Pt uses electric bed that elevates her, but she has worsening edema throughout  the day.  Pt's  blood sugar has been going up in the past few months.  Pt is a nonsmoker.  Pt does not exercise as she feels she's out of breath. Pt has done calcium scoring 2006 and was 11.  Pt adds that she's also having dizziness from time to time but  she's unsure if this is cardiac related or aging.  Pt adds that she also had a carotid recently and it showed normal results. Pt has strong family hx of heart attack from her brother, sister and her father, but she adds that her siblings were heavy smoker.       The patient denies chest pain, orthopnea, PND, palpitations, syncope, presyncope or fatigue.              ACTIVE PROBLEM LIST  Patient Active Problem List           Diagnosis  Date Noted         ?  Small airways disease  10/06/2015     ?  Hypothyroidism  05/08/2015     ?  Pure hypercholesterolemia  05/08/2015     ?  History of sinus bradycardia  05/08/2015     ?  Family history of heart attack  05/08/2015     ?  History of prediabetes  05/08/2015     ?  Hx of diverticulitis of colon  05/08/2015     ?  History of cholecystectomy  05/08/2015         ?  Dysthymia  05/08/2015                  PAST MEDICAL HISTORY          Past Medical History:        Diagnosis  Date         ?  Arthritis            hands/knees         ?  Asthma       ?  Chronic obstructive pulmonary disease (HCC)       ?  Dysthymia  05/08/2015     ?  Family history of heart attack  05/08/2015     ?  GERD (gastroesophageal reflux disease)       ?  History of cholecystectomy  05/08/2015     ?  History of prediabetes  05/08/2015     ?  History of sinus bradycardia  05/08/2015     ?  Hx of diverticulitis of colon  05/08/2015     ?  Hypothyroidism  05/08/2015     ?  Pure hypercholesterolemia  05/08/2015     ?  Small airways disease  10/06/2015         ?  Thyroid disease                    PAST SURGICAL HISTORY          Past Surgical History:         Procedure  Laterality  Date          ?  COLONOSCOPY  N/A  04/22/2017          COLONOSCOPY performed by  Eugenie Filler., MD at [facility]          ?  COLORECTAL SCRN; HI RISK IND    04/22/2017                     ?  HX CATARACT REMOVAL  Bilateral  2010     ?  HX CHOLECYSTECTOMY    2016     ?  HX OOPHORECTOMY  Bilateral  2017     ?  HX OTHER SURGICAL    2012          colon polyp removed          ?  HX OTHER SURGICAL              ovarian cyst removed          ?  HX RETINAL DETACHMENT REPAIR  Right  2008          tears          ?  HX TUBAL LIGATION         ?  UPPER GI ENDOSCOPY,BIOPSY    01/06/2015                            ALLERGIES          Allergies        Allergen  Reactions         ?  Simvastatin  Myalgia                 FAMILY HISTORY          Family History         Problem  Relation  Age of Onset          ?  Heart Disease  Mother       ?  Heart Disease  Father       ?  Heart Disease  Sister       ?  Colon Cancer  Sister            ?  Heart Disease  Brother        negative for cardiac disease            SOCIAL HISTORY          Social History          Socioeconomic History         ?  Marital status:  DIVORCED              Spouse name:  Not on file         ?  Number of children:  Not on file     ?  Years of education:  Not on file     ?  Highest education level:  Not on file       Tobacco Use         ?  Smoking status:  Never Smoker     ?  Smokeless tobacco:  Never Used       Substance and Sexual Activity         ?  Alcohol use:  No     ?  Drug use:  No         ?  Sexual activity:  Never                MEDICATIONS          Current Outpatient Medications        Medication  Sig         ?  terbinafine HCl (LAMISIL) 250 mg tablet  Take 250 mg by mouth daily.     ?  traZODone (DESYREL) 50 mg tablet  Take  by mouth nightly.     ?  docusate sodium (COLACE) 100 mg capsule  Take 100 mg by mouth as needed for Constipation.     ?  levothyroxine (SYNTHROID) 100 mcg tablet  Take 112 mcg by mouth every other day.     ?  OTHER  Allergy shots - weekly.     ?  furosemide (LASIX) 20 mg tablet  Take 20 mg by mouth as  needed.     ?  cholecalciferol (VITAMIN D3) 5,000 unit capsule  Take 1 Cap by mouth every Tuesday Thursday, Saturday.     ?  VENTOLIN HFA 90 mcg/actuation inhaler  INHALE TWO PUFFS BY MOUTH EVERY 4 HOURS AS NEEDED FOR  WHEEZING     ?  Omega-3 Fatty Acids (FISH OIL) 500 mg cap  Take  by mouth as needed.     ?  MONTELUKAST SODIUM (SINGULAIR PO)  Take  by mouth daily.     ?  fexofenadine (ALLEGRA) 180 mg tablet  Take  by mouth daily.     ?  liothyronine (CYTOMEL) 5 mcg tablet  Take 1 Tab by mouth daily.     ?  omeprazole (PRILOSEC) 40 mg capsule  Take 1 Cap by mouth daily. Indications: GASTROESOPHAGEAL REFLUX     ?  ascorbic acid (VITAMIN C) 1,000 mg tablet  Take 1,000 mg by mouth as needed.         ?  calcium-magnesium-zinc Tab  Take 1,000 mg by mouth as needed.          No current facility-administered medications for this visit.            I have reviewed the nurses notes, vitals, problem list, allergy list, medical history, family, social history and medications.            REVIEW OF SYMPTOMS         General: Pt denies excessive weight gain or loss. Pt is able to conduct ADL's   HEENT: Denies blurred vision, headaches, hearing loss, epistaxis and difficulty swallowing.   Respiratory: Denies cough, congestion, SOB, wheezing or stridor.  Positive for DOE.    Cardiovascular: Denies precordial pain, palpitations, or PND. Positive for LE edema.    Gastrointestinal: Denies poor appetite, indigestion, abdominal pain or blood in stool   Genitourinary: Denies hematuria, dysuria, increased urinary frequency   Musculoskeletal: Denies joint pain or swelling from muscles or joints   Neurologic: Denies tremor, paresthesias, headache, or sensory motor disturbance   Psychiatric: Denies confusion, insomnia, depression   Integumentray: Denies rash, itching or ulcers.   Hematologic: Denies easy bruising, bleeding         PHYSICAL EXAMINATION           Vitals:          01/21/18 1317        BP:  114/68     Pulse:  (!) 48     SpO2:  96%      Weight:  179 lb (81.2 kg)        Height:  _0  (1.626 m)        General: Well developed, in no acute distress.   HEENT: No jaundice, oral mucosa moist, no oral ulcers   Neck: Supple, no stiffness, no lymphadenopathy, supple   Heart: +bradycardia   Respiratory: Clear bilaterally x 4, no wheezing or rales   Abdomen:?? ??Soft, non-tender, bowel sounds are active.??   Extremities:  No edema, normal cap refill, no cyanosis.   Musculoskeletal: No clubbing, no deformities   Neuro: A&Ox3, speech clear, gait stable, cooperative, no focal neurologic deficits   Skin: Skin color is normal. No rashes or lesions. Non diaphoretic, moist.   Vascular: 2+ pulses symmetric in all extremities            EKG: SB         DIAGNOSTIC DATA        1. Stress Test   Lexiscan- 01/02/2016- no perfusion defects consistent with prior infarction or current ischemia, EF (55-65%).      2. Echo    12/10/12- EF 60-65%   07/14/14- EF 60%, LAE mild      3. Lipids   06/09/17- TC 199, HDL 52, LDL 113, TG 171   09/30/17- TC 238, HDL 49, LDL 128, TG 307  01/06/18- TC 220, HDL 48, LDL 119, TG 264      4. Calcium Score   10/01/17 -   The coronary calcium in each vessel is as follows:   ??   Left main coronary artery: 0   Left anterior descending coronary artery: 28   Left circumflex coronary artery: 1   Right coronary artery: 0   Posterior descending coronary artery: 0   ??   Total calcium score: 29               LABORATORY DATA                    Lab Results         Component  Value  Date/Time            WBC  6.5  06/09/2017 09:33 AM       HGB  14.6  06/09/2017 09:33 AM       HCT  42.4  06/09/2017 09:33 AM       PLATELET  289  06/09/2017 09:33 AM            MCV  90  06/09/2017 09:33 AM           Lab Results         Component  Value  Date/Time            Sodium  143  06/09/2017 09:33 AM       Potassium  4.5  06/09/2017 09:33 AM       Chloride  105  06/09/2017 09:33 AM       CO2  24  06/09/2017 09:33 AM       Anion gap  5  09/08/2008 02:40 PM       Glucose  105 (H)   06/09/2017 09:33 AM       BUN  11  06/09/2017 09:33 AM       Creatinine  0.68  06/09/2017 09:33 AM       BUN/Creatinine ratio  16  06/09/2017 09:33 AM       GFR est AA  97  06/09/2017 09:33 AM       GFR est non-AA  84  06/09/2017 09:33 AM       Calcium  9.1  06/09/2017 09:33 AM       Bilirubin, total  0.5  06/09/2017 09:33 AM       AST (SGOT)  21  06/09/2017 09:33 AM       Alk. phosphatase  69  06/09/2017 09:33 AM       Protein, total  6.5  06/09/2017 09:33 AM       Albumin  4.1  06/09/2017 09:33 AM       Globulin  3.1  09/08/2008 02:40 PM       A-G Ratio  1.7  06/09/2017 09:33 AM            ALT (SGPT)  22  06/09/2017 09:33 AM                  ASSESSMENT/RECOMMENDATIONS:.         1. Bradycardia.   - She has been fatigued and dizzy at time.   - She is wearing a 24 hour Holter now to look for dysrhythmias    - Will order a walking stress test to see if we can get her HR up appropriately. If not, will order an echo.   - TSH was normal.  2. Dyslipidemia on Pravachol 40 mg   - close to goal   - On Pravachol every other night., I suggested she take it daily.       3. Fatigue      4. Possible OSA with periods of apnea per her daughter.    - Will get a sleep evaluation      Return in 6 weeks or PRN.        Orders Placed This Encounter        ?  terbinafine HCl (LAMISIL) 250 mg tablet             Sig: Take 250 mg by mouth daily.        ?  traZODone (DESYREL) 50 mg tablet             Sig: Take  by mouth nightly.                 Follow-up and Dispositions    ??       Return in about 6 weeks (around 03/04/2018).                    I have discussed the diagnosis with   Deno Lunger Demeter and the intended plan as seen in the above orders.  Questions were answered concerning future plans.  I have  discussed medication side effects and warnings with the patient as well.      Thank you,  Eliberto Ivory, MD for involving me in the care of  [place] . Please do not hesitate to contact me for further questions/concerns.        Written by Maryan Rued, as dictated by Vennie Homans, MD.        Jeannette How.Koula Venier,  MD, [facility]      Patient Care Team:   Eliberto Ivory, MD as PCP - General (Internal Medicine)   Sheral Flow Grayling Congress, MD as Surgeon (General Surgery)   Kenton Kingfisher, MD (Endocrinology)      [facility]       [place], [place]      [place], [place]       [phone] / [phone] Fax

## 2018-01-21 NOTE — Progress Notes (Signed)
 Patient had holter put on 9/3, DX. Bradycardia   Patient has Lipids   Patient says that she is tired  Patient has a little swelling in legs   Patient says that she is wobbly when standing       Visit Vitals  BP 114/68 (BP 1 Location: Right arm, BP Patient Position: Sitting)   Pulse (!) 48   Ht 5' 4 (1.626 m)   Wt 179 lb (81.2 kg)   SpO2 96%   BMI 30.73 kg/m

## 2018-01-21 NOTE — Addendum Note (Signed)
Addendum  Note by Call, Judeth Cornfield at 01/21/18 1320                Author: Call, Judeth Cornfield  Service: --  Author Type: --       Filed: 01/22/18 1128  Encounter Date: 01/21/2018  Status: Signed          Editor: Call, Judeth Cornfield          Addended by: Jerline Pain on: 01/22/2018 11:28 AM    Modules accepted: Level of Service

## 2018-01-21 NOTE — Progress Notes (Signed)
Patient had holter put on 9/3, DX. Bradycardia   Patient has Lipids   Patient says that she is tired  Patient has a little swelling in legs   Patient says that she is wobbly when standing       Visit Vitals  BP 114/68 (BP 1 Location: Right arm, BP Patient Position: Sitting)   Pulse (!) 48   Ht 5' 4" (1.626 m)   Wt 179 lb (81.2 kg)   SpO2 96%   BMI 30.73 kg/m??

## 2018-01-21 NOTE — Addendum Note (Signed)
Addended by: Jerline Pain on: 01/22/2018 11:28 AM     Modules accepted: Level of Service

## 2018-01-22 LAB — EXERCISE CARDIAC STRESS TEST
Angina Index: 0
Baseline Diastolic BP: 68 mmHg
Baseline HR: 60 {beats}/min
Baseline O2 Sat: 96 %
Baseline Systolic BP: 114 mmHg
Duke Treadmill Score: 7
Stress Diastolic BP: 84 mmHg
Stress Estimated Workload: 8 METS
Stress O2 Sat: 98 %
Stress Peak HR: 122 {beats}/min
Stress Percent HR Achieved: 87 %
Stress Rate Pressure Product: 16836 bpm*mmHg
Stress ST Depression: 0 mm
Stress ST Elevation: 0 mm
Stress Systolic BP: 138 mmHg
Stress Target HR: 141 {beats}/min

## 2018-01-22 LAB — STRESS TEST ONLY EXERCISE
Angina Index: 0
Baseline Diastolic BP: 68 mmHg
Baseline HR: 60 {beats}/min
Baseline O2 Sat: 96 %
Baseline Systolic BP: 114 mmHg
Duke Treadmill Score: 7
Stress Diastolic BP: 84 mmHg
Stress Estimated Workload: 8 METS
Stress O2 Sat: 98 %
Stress Peak HR: 122 {beats}/min
Stress Percent HR Achieved: 87 %
Stress Rate Pressure Product: 16836 bpm*mmHg
Stress ST Depression: 0 mm
Stress ST Elevation: 0 mm
Stress Systolic BP: 138 mmHg
Stress Target HR: 141 {beats}/min

## 2018-01-22 NOTE — Progress Notes (Signed)
Normal stress test    Santina Trillo, MD

## 2018-01-22 NOTE — Progress Notes (Signed)
Sent letter

## 2018-01-22 NOTE — Progress Notes (Signed)
Normal stress test    Leslie Meester, MD

## 2018-01-23 ENCOUNTER — Encounter

## 2018-02-17 ENCOUNTER — Ambulatory Visit: Attending: Internal Medicine | Primary: Internal Medicine

## 2018-02-17 ENCOUNTER — Ambulatory Visit: Admit: 2018-02-17 | Discharge: 2018-02-17 | Payer: MEDICARE | Attending: Internal Medicine | Primary: Internal Medicine

## 2018-02-17 DIAGNOSIS — G4733 Obstructive sleep apnea (adult) (pediatric): Secondary | ICD-10-CM

## 2018-02-17 NOTE — Progress Notes (Signed)
[address] [place], [place] [address]  Tel.  [phone]  Fax. [phone] [address]  [place], [place] [address]  Tel.  [phone]  Fax. [phone] [address]  [place], [place] [address]  Tel.  [phone]  Fax. [phone]         Subjective:      Leslie Potter is an 77 y.o. female referred for evaluation for a sleep disorder. She complains of slow heart rate associated with excessive daytime sleepiness, and concern that she may have sleep apnea.  Symptoms began several months ago, unchanged since that time. She usually can fall asleep in 30-60 minutes.  Family or house members note periods of not breathing. She denies falling asleep while driving.  Leslie Potter does wake up frequently at night. She is bothered by waking up too early and left unable to get back to sleep. She actually sleeps about 6 hours at night and wakes up about 6 times during the night. She does not work shifts:  [employer]   Leslie Potter indicates she does get too little sleep at night. Her bedtime is 2000. She awakens at 0330. She does take naps. She takes 3 naps a week lasting 15 to 30. She has the following observed behaviors: Pauses in breathing;  .  Other remarks:    She is a retired Engineer, civil (consulting)  Epworth Sleepiness Score: 10   which reflect mild daytime drowsiness.    Allergies   Allergen Reactions   ??? Simvastatin Myalgia         Current Outpatient Medications:   ???  levothyroxine (SYNTHROID) 112 mcg tablet, Take 112 mcg by mouth., Disp: , Rfl:   ???  cholecalciferol (VITAMIN D3) 2,000 unit cap capsule, Vitamin D3 2,000 unit capsule  Take by oral route., Disp: , Rfl:   ???  pravastatin (PRAVACHOL) 40 mg tablet, Take 40 mg by mouth., Disp: , Rfl:   ???  co-enzyme Q-10 (CO Q-10) 100 mg capsule, Take 100 mg by mouth daily., Disp: , Rfl:   ???  magnesium 250 mg tab, Take  by mouth., Disp: , Rfl:   ???  terbinafine HCl (LAMISIL) 250 mg tablet, Take 250 mg by mouth daily., Disp: , Rfl:    ???  traZODone (DESYREL) 50 mg tablet, Take  by mouth nightly., Disp: , Rfl:   ???  OTHER, Allergy shots - weekly., Disp: , Rfl:   ???  furosemide (LASIX) 20 mg tablet, Take 20 mg by mouth as needed., Disp: , Rfl:   ???  cholecalciferol (VITAMIN D3) 5,000 unit capsule, Take 1 Cap by mouth every Tuesday Thursday, Saturday., Disp: , Rfl:   ???  VENTOLIN HFA 90 mcg/actuation inhaler, INHALE TWO PUFFS BY MOUTH EVERY 4 HOURS AS NEEDED FOR  WHEEZING, Disp: 1 Inhaler, Rfl: 12  ???  MONTELUKAST SODIUM (SINGULAIR PO), Take  by mouth daily., Disp: , Rfl:   ???  fexofenadine (ALLEGRA) 180 mg tablet, Take  by mouth daily., Disp: , Rfl:   ???  liothyronine (CYTOMEL) 5 mcg tablet, Take 1 Tab by mouth daily., Disp: 90 Tab, Rfl: 4  ???  omeprazole (PRILOSEC) 40 mg capsule, Take 1 Cap by mouth daily. Indications: GASTROESOPHAGEAL REFLUX, Disp: 90 Cap, Rfl: 6  ???  pravastatin (PRAVACHOL) 20 mg tablet, 40 mg., Disp: , Rfl:   ???  mirabegron ER (MYRBETRIQ) 50 mg ER tablet, Myrbetriq 50 mg tablet,extended release  Take 1 tablet every day by oral route for 31 days., Disp: , Rfl:   ???  diclofenac (VOLTAREN) 1 % gel, Apply two grams by topical route four times daily to the affected area (s)., Disp: , Rfl:   ???  desmopressin (DDAVP) 0.1 mg tablet, DDAVP 0.1 mg tablet  take one PO qhs, Disp: , Rfl:   ???  cephALEXin (KEFLEX) 500 mg capsule, TAKE 1 CAPSULE BY MOUTH EVERY 12 HOURS FOR 7 DAYS, Disp: , Rfl: 0  ???  vitamin E (AQUA GEMS) 400 unit capsule, vitamin E, Disp: , Rfl:   ???  docusate sodium (COLACE) 100 mg capsule, Take 100 mg by mouth as needed for Constipation., Disp: , Rfl:   ???  levothyroxine (SYNTHROID) 100 mcg tablet, Take 112 mcg by mouth every other day., Disp: , Rfl:   ???  Omega-3 Fatty Acids (FISH OIL) 500 mg cap, Take  by mouth as needed., Disp: , Rfl:   ???  ascorbic acid (VITAMIN C) 1,000 mg tablet, Take 1,000 mg by mouth as needed., Disp: , Rfl:   ???  calcium-magnesium-zinc Tab, Take 1,000 mg by mouth as needed., Disp: , Rfl:       She  has a past medical history of Arthritis, Asthma, Chronic obstructive pulmonary disease (HCC), Dysthymia (05/08/2015), Family history of heart attack (05/08/2015), GERD (gastroesophageal reflux disease), History of cholecystectomy (05/08/2015), History of prediabetes (05/08/2015), History of sinus bradycardia (05/08/2015), diverticulitis of colon (05/08/2015), Hyperlipidemia, Hypothyroidism (05/08/2015), Pure hypercholesterolemia (05/08/2015), Small airways disease (10/06/2015), and Thyroid disease.    She  has a past surgical history that includes upper gi endoscopy,biopsy (01/06/2015); hx other surgical (2012); hx other surgical; hx tubal ligation; hx oophorectomy (Bilateral, 2017); hx retinal detachment repair (Right, 2008); hx cataract removal (Bilateral, 2010); hx cholecystectomy (2016); colorectal scrn; hi risk ind (04/22/2017); and colonoscopy (N/A, 04/22/2017).    She family history includes Colon Cancer in her sister; Heart Disease in her brother, father, mother, and sister.    She  reports that she has never smoked. She has never used smokeless tobacco. She reports that she does not drink alcohol or use drugs.     Review of Systems:  Constitutional:  She has recently lost about 15 pounds  Eyes:  No blurred vision.  CVS:  No significant chest pain, following with cardiology for low heart rate  Pulm:  No significant shortness of breath  GI:  No significant nausea or vomiting  GU:  + significant nocturia, she goes every two hours  Musculoskeletal:  No significant joint pain at night  Skin:  No significant rashes  Neuro:  No significant dizziness   Psych:  No active mood issues    Sleep Review of Systems: notable for no difficulty falling asleep; +frequent awakenings at night;  regular dreaming noted; no nightmares ; no early morning headaches; no memory problems; no concentration issues; no history of any automobile or occupational accidents due to daytime drowsiness.      Objective:     Visit Vitals   BP 118/74 (BP 1 Location: Left arm, BP Patient Position: Sitting)   Pulse 61   Resp 16   Ht 5\' 4"  (1.626 m)   Wt 162 lb 3.2 oz (73.6 kg)   SpO2 100%   BMI 27.84 kg/m??         General:   Not in acute distress   Eyes:  Anicteric sclerae, no obvious strabismus   Nose:  No obvious nasal septum deviation    Oropharynx:   Class 3 oropharyngeal outlet, thick tongue base, , low-lying soft palate, narrow tonsilo-pharyngeal pilars  Tonsils:   tonsils are absent   Neck:   Neck circ. in "inches": 15; midline trachea   Chest/Lungs:  Equal lung expansion, clear on auscultation    CVS:  Normal rate, regular rhythm; no JVD   Skin:  Warm to touch; no obvious rashes   Neuro:  No focal deficits ; no obvious tremor    Psych:  Normal affect,  normal countenance;          Assessment:       ICD-10-CM ICD-9-CM    1. Obstructive sleep apnea (adult) (pediatric) G47.33 327.23 POLYSOMNOGRAPHY 1 NIGHT         Plan:     * The patient currently has a Moderate Risk for having sleep apnea.  STOP-BANG score 3.  * PSG was ordered for initial evaluation.  We will follow the American Academy of Sleep Medicine protocol regarding split-night procedures and offering a trial of Positive Airway Pressure (CPAP, BPAP, etc.)  * She was provided information on sleep apnea including coresponding risk factors and the importance of proper treatment.  * Counseling was provided regarding proper sleep hygiene and safe driving.  Treatment options for sleep apnea were reviewed.   she is not against a trial of PAP if found to have significant sleep apnea.   The treatment plan was reviewed with the patient in detail and reviewed with the patient and the lead technologist. she understands that the lead technologist will be calling her  with the results and assisting with the next step in the treatment plan as outlined today during the consultation with me. All of her questions were addressed.       Thank you for allowing [place] to participate in your patient's medical care.   We'll keep you updated on these investigations.  Electronically signed by    Miguel Aschoff, MD  Diplomate in Sleep Medicine  [employer]

## 2018-02-17 NOTE — Progress Notes (Signed)
Holter monitor      HR varies but usually slow when sleeping. Otherwise no significant arrhthymias.    Leslie ChestnutMark Ali Mclaurin, MD

## 2018-02-17 NOTE — Progress Notes (Signed)
Progress Notes by Lincoln Brigham, MD at 02/17/18 1400                Author: Lincoln Brigham, MD  Service: --  Author Type: Physician       Filed: 02/17/18 1559  Encounter Date: 02/17/2018  Status: Signed          Editor: Lincoln Brigham, MD (Physician)                                          [address] [place], [place] [address]   Tel.  [phone]   Fax. [phone]  [address]   [place], [place] [address]   Tel.  [phone]   Fax. [phone]  [address]   [place], [place] [address]   Tel.  [phone]   Fax. [phone]                Subjective:         Leslie Potter is an 77 y.o.  female referred for evaluation for a sleep disorder. She  complains of slow heart rate associated with excessive daytime sleepiness, and concern that she may have sleep apnea.  Symptoms began several months ago, unchanged since that time. She  usually can fall asleep in 30-60 minutes.  Family or house members note periods of not breathing. She denies falling asleep while driving.   Mercadez H Gibeault does  wake up frequently at night. She is  bothered by waking up too early and left unable to get back to sleep. She actually sleeps about  6 hours at night and wakes up about 6 times during the night.  She does not work shifts:   Marland Kitchen    Fallon indicates she  does get too little sleep at night. Her  bedtime is 2000. She  awakens at 0330.  She does take naps.  She takes 3 naps a week lasting  15 to 30. She has the following observed behaviors:  Pauses in breathing;  .   Other remarks:     She is a retired Engineer, civil (consulting)   Epworth Sleepiness Score: 10   which reflect mild  daytime drowsiness.        Allergies        Allergen  Reactions         ?  Simvastatin  Myalgia              Current Outpatient Medications:    ?  levothyroxine (SYNTHROID) 112 mcg tablet, Take 112 mcg by mouth., Disp: , Rfl:    ?  cholecalciferol (VITAMIN D3) 2,000 unit cap capsule, Vitamin D3 2,000 unit capsule  Take by oral  route., Disp: , Rfl:    ?  pravastatin (PRAVACHOL) 40 mg tablet, Take 40 mg by mouth., Disp: , Rfl:    ?  co-enzyme Q-10 (CO Q-10) 100 mg capsule, Take 100 mg by mouth daily., Disp: , Rfl:    ?  magnesium 250 mg tab, Take  by mouth., Disp: , Rfl:    ?  terbinafine HCl (LAMISIL) 250 mg tablet, Take 250 mg by mouth daily., Disp: , Rfl:    ?  traZODone (DESYREL) 50 mg tablet, Take  by mouth nightly., Disp: , Rfl:    ?  OTHER, Allergy shots - weekly., Disp: , Rfl:    ?  furosemide (LASIX) 20 mg tablet, Take 20 mg by mouth as needed., Disp: , Rfl:    ?  cholecalciferol (VITAMIN D3) 5,000 unit capsule, Take 1 Cap by mouth every Tuesday Thursday, Saturday., Disp: , Rfl:    ?  VENTOLIN HFA 90 mcg/actuation inhaler, INHALE TWO PUFFS BY MOUTH EVERY 4 HOURS AS NEEDED FOR  WHEEZING, Disp: 1 Inhaler, Rfl: 12   ?  MONTELUKAST SODIUM (SINGULAIR PO), Take  by mouth daily., Disp: , Rfl:    ?  fexofenadine (ALLEGRA) 180 mg tablet, Take  by mouth daily., Disp: , Rfl:    ?  liothyronine (CYTOMEL) 5 mcg tablet, Take 1 Tab by mouth daily., Disp: 90 Tab, Rfl: 4   ?  omeprazole (PRILOSEC) 40 mg capsule, Take 1 Cap by mouth daily. Indications: GASTROESOPHAGEAL REFLUX, Disp: 90 Cap, Rfl: 6   ?  pravastatin (PRAVACHOL) 20 mg tablet, 40 mg., Disp: , Rfl:    ?  mirabegron ER (MYRBETRIQ) 50 mg ER tablet, Myrbetriq 50 mg tablet,extended release  Take 1 tablet every day by oral route for 31 days., Disp: , Rfl:    ?  diclofenac (VOLTAREN) 1 % gel, Apply two grams by topical route four times daily to the affected area (s)., Disp: , Rfl:    ?  desmopressin (DDAVP) 0.1 mg tablet, DDAVP 0.1 mg tablet  take one PO qhs, Disp: , Rfl:    ?  cephALEXin (KEFLEX) 500 mg capsule, TAKE 1 CAPSULE BY MOUTH EVERY 12 HOURS FOR 7 DAYS, Disp: , Rfl: 0   ?  vitamin E (AQUA GEMS) 400 unit capsule, vitamin E, Disp: , Rfl:    ?  docusate sodium (COLACE) 100 mg capsule, Take 100 mg by mouth as needed for Constipation., Disp: , Rfl:    ?  levothyroxine (SYNTHROID) 100 mcg  tablet, Take 112 mcg by mouth every other day., Disp: , Rfl:    ?  Omega-3 Fatty Acids (FISH OIL) 500 mg cap, Take  by mouth as needed., Disp: , Rfl:    ?  ascorbic acid (VITAMIN C) 1,000 mg tablet, Take 1,000 mg by mouth as needed., Disp: , Rfl:    ?  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      She  has a past surgical history that includes upper gi endoscopy,biopsy (01/06/2015);  hx other surgical (2012); hx other surgical; hx tubal ligation; hx oophorectomy (Bilateral, 2017); hx retinal detachment repair (Right, 2008); hx cataract removal (Bilateral, 2010); hx cholecystectomy (2016); colorectal scrn; hi risk ind (04/22/2017);  and colonoscopy (N/A, 04/22/2017).      She family history includes Colon Cancer in her sister; Heart Disease in her brother,  father, mother, and sister.      She  reports that she has never smoked. She has never used smokeless tobacco. She reports that she does not drink alcohol or use  drugs.       Review of Systems:   Constitutional:  She has recently lost about 15 pounds   Eyes:  No blurred vision.   CVS:  No significant chest pain,  following with cardiology for low heart rate   Pulm:  No significant shortness of breath   GI:  No significant nausea or vomiting   GU:  + significant nocturia, she goes every two hours   Musculoskeletal:  No significant joint pain at night   Skin:  No significant rashes   Neuro:  No significant dizziness    Psych:   No active mood issues      Sleep Review of Systems: notable for no difficulty falling asleep; +frequent awakenings at night;  regular dreaming noted;  no nightmares ; no early morning headaches; no memory problems; no concentration issues; no history of any automobile or occupational accidents due to daytime drowsiness.           Objective:        Visit Vitals      BP  118/74 (BP 1 Location: Left arm, BP Patient Position: Sitting)     Pulse  61     Resp  16     Ht  5\' 4"  (1.626 m)     Wt  162 lb 3.2 oz (73.6 kg)     SpO2  100%        BMI  27.84 kg/m??                 General:    Not in acute distress     Eyes:   Anicteric sclerae, no obvious strabismus     Nose:   No obvious nasal septum deviation      Oropharynx:    Class 3 oropharyngeal outlet, thick tongue base, , low-lying soft palate, narrow tonsilo-pharyngeal pilars     Tonsils:    tonsils are absent     Neck:    Neck circ. in "inches": 15; midline trachea        Chest/Lungs:   Equal lung expansion, clear on auscultation      CVS:   Normal rate, regular rhythm; no JVD     Skin:   Warm to touch; no obvious rashes     Neuro:   No focal deficits ; no obvious tremor      Psych:   Normal affect,  normal countenance;                Assessment:                  ICD-10-CM  ICD-9-CM             1.  Obstructive sleep apnea (adult) (pediatric)  G47.33  327.23  POLYSOMNOGRAPHY 1 NIGHT                Plan:        * The patient currently has a Moderate Risk for having sleep apnea.  STOP-BANG score 3.   * PSG was ordered for initial evaluation.  We will follow the American Academy of Sleep Medicine protocol regarding split-night procedures and offering a trial of Positive Airway Pressure (CPAP, BPAP, etc.)   * She was provided information on sleep apnea including coresponding risk factors and the importance of proper treatment.   * Counseling was provided regarding proper sleep hygiene and safe driving.   Treatment options for sleep apnea were reviewed.    she is not against  a trial of PAP if found to have significant sleep apnea.    The treatment plan was reviewed with the patient in detail and reviewed with the patient and the lead technologist. she understands that  the  lead technologist will be calling her  with the results and assisting with the next step in the treatment plan as outlined today during the consultation  with me. All of her questions were addressed.          Thank you for allowing [place] to participate in your patient's medical care.  We'll keep you updated on these investigations.   Electronically signed by      Miguel Aschoff, MD   Diplomate in Sleep Medicine   [facility]

## 2018-02-17 NOTE — Progress Notes (Signed)
Holter monitor      HR varies but usually slow when sleeping. Otherwise no significant arrhthymias.    Rushie Chestnut, MD

## 2018-02-17 NOTE — Patient Instructions (Signed)
[address]  [place] [address]  Tel.  [phone]  Fax. [phone] [address]  [place] [address]  Tel.  [phone]  Fax. [phone] [address] [place] [address]  Tel.  [phone]  Fax. [phone]     Sleep Apnea: After Your Visit  Your Care Instructions  Sleep apnea occurs when you frequently stop breathing for 10 seconds or longer during sleep. It can be mild to severe, based on the number of times per hour that you stop breathing or have slowed breathing. Blocked or narrowed airways in your nose, mouth, or throat can cause sleep apnea. Your airway can become blocked when your throat muscles and tongue relax during sleep.  Sleep apnea is common, occurring in 1 out of 20 individuals.  Individuals having any of the following characteristics should be evaluated and treated right away due to high risk and detrimental consequences from untreated sleep apnea:  1. Obesity  2. Congestive Heart failure  3. Atrial Fibrillation  4. Uncontrolled Hypertension  5. Type II Diabetes  6. Night-time Arrhythmias  7. Stroke  8. Pulmonary Hypertension  9. High-risk Driving Populations (pilots, truck drivers, etc.)  10. Patients Considering Weight-loss Surgery    How do you know you have sleep apnea?  You probably have sleep apnea if you answer 'yes' to 3 or more of the following questions:  S - Have you been told that you Snore?   T - Are you often Tired during the day?  O - Has anyone Observed you stop breathing while sleeping?  P- Do you have (or are being treated for) high blood Pressure?    B - Are you obese (Body Mass Index > 35)?  A - Is your Age 79 years old or older?  N - Is your Neck size greater than 16 inches?  G - Are you female Gender?  A sleep physician can prescribe a breathing device that prevents tissues in the throat from blocking your airway. Or your doctor may recommend using a dental device (oral breathing device) to help keep your airway  open. In some cases, surgery may be needed to remove enlarged tissues in the throat.  Follow-up care is a key part of your treatment and safety. Be sure to make and go to all appointments, and call your doctor if you are having problems. It's also a good idea to know your test results and keep a list of the medicines you take.  How can you care for yourself at home?  ?? Lose weight, if needed. It may reduce the number of times you stop breathing or have slowed breathing.  ?? Go to bed at the same time every night.  ?? Sleep on your side. It may stop mild apnea. If you tend to roll onto your back, sew a pocket in the back of your pajama top. Put a tennis ball into the pocket, and stitch the pocket shut. This will help keep you from sleeping on your back.  ?? Avoid alcohol and medicines such as sleeping pills and sedatives before bed.  ?? Do not smoke. Smoking can make sleep apnea worse. If you need help quitting, talk to your doctor about stop-smoking programs and medicines. These can increase your chances of quitting for good.  ?? Prop up the head of your bed 4 to 6 inches by putting bricks under the legs of the bed.  ?? Treat breathing problems, such as a stuffy nose, caused   by a cold or allergies.  ?? Use a continuous positive airway pressure (CPAP) breathing machine if lifestyle changes do not help your apnea and your doctor recommends it. The machine keeps your airway from closing when you sleep.  ?? If CPAP does not help you, ask your doctor whether you should try other breathing machines. A bilevel positive airway pressure machine has two types of air pressure????????one for breathing in and one for breathing out. Another device raises or lowers air pressure as needed while you breathe.  ?? If your nose feels dry or bleeds when using one of these machines, talk with your doctor about increasing moisture in the air. A humidifier may help.  ?? If your nose is runny or stuffy from using a breathing machine, talk  with your doctor about using decongestants or a corticosteroid nasal spray.  When should you call for help?  Watch closely for changes in your health, and be sure to contact your doctor if:  ?? You still have sleep apnea even though you have made lifestyle changes.  ?? You are thinking of trying a device such as CPAP.  ?? You are having problems using a CPAP or similar machine.                Where can you learn more?   Go to http://www.healthwise.net/[facility].  Enter J936 in the search box to learn more about "Sleep Apnea: After Your Visit."   ?? 2006-2010 Healthwise, Inc[employer]. Care instructions adapted under license by [facility] (which disclaims liability or warranty for this information). This care instruction is for use with your licensed healthcare professional. If you have questions about a medical condition or this instruction, always ask your healthcare professional. Healthwise disclaims any warranty or liability for your use of this information.      PROPER SLEEP HYGIENE    What to avoid  ?? Do not have drinks with caffeine, such as coffee or black tea, for 8 hours before bed.  ?? Do not smoke or use other types of tobacco near bedtime. Nicotine is a stimulant and can keep you awake.  ?? Avoid drinking alcohol late in the evening, because it can cause you to wake in the middle of the night.  ?? Do not eat a big meal close to bedtime. If you are hungry, eat a light snack.  ?? Do not drink a lot of water close to bedtime, because the need to urinate may wake you up during the night.  ?? Do not read or watch TV in bed. Use the bed only for sleeping and sexual activity.  What to try  ?? Go to bed at the same time every night, and wake up at the same time every morning. Do not take naps during the day.  ?? Keep your bedroom quiet, dark, and cool.  ?? Get regular exercise, but not within 3 to 4 hours of your bedtime..  ?? Sleep on a comfortable pillow and mattress.   ?? If watching the clock makes you anxious, turn it facing away from you so you cannot see the time.  ?? If you worry when you lie down, start a worry book. Well before bedtime, write down your worries, and then set the book and your concerns aside.  ?? Try meditation or other relaxation techniques before you go to bed.  ?? If you cannot fall asleep, get up and go to another room until you feel sleepy. Do something relaxing. Repeat your bedtime routine   before you go to bed again.  ?? Make your house quiet and calm about an hour before bedtime. Turn down the lights, turn off the TV, log off the computer, and turn down the volume on music. This can help you relax after a busy day.    Drowsy Driving  The [place] National Highway Traffic Safety Administration cites drowsiness as a causing factor in more than 100,000 police reported crashes annually, resulting in 76,000 injuries and 1,500 deaths. Other surveys suggest 55% of people polled have driven while drowsy in the past year, 23% had fallen asleep but not crashed, 3% crashed, and 2% had and accident due to drowsy driving.  Who is at risk?   Young Drivers: One study of drowsy driving accidents states that 55% of the drivers were under 25 years. Of those, 75% were female.   Shift Workers and Travelers: People who work overnight or travel across time zones frequently are at higher risk of experiencing Circadian Rhythm Disorders. They are trying to work and function when their body is programed to sleep.   Sleep Deprived: Lack of sleep has a serious impact on your ability to pay attention or focus on a task. Consistently getting less than the average of 8 hours your body needs creates partial or cumulative sleep deprivation.   Untreated Sleep Disorders: Sleep Apnea, Narcolepsy, R.L.S., and other sleep disorders (untreated) prevent a person from getting enough restful sleep. This leads to excessive daytime sleepiness and increases the risk  for drowsy driving accidents by up to 7 times.  Medications / Alcohol: Even over the counter medications can cause drowsiness. Medications that impair a drivers attention should have a warning label. Alcohol naturally makes you sleepy and on its own can cause accidents. Combined with excessive drowsiness its effects are amplified.   Signs of Drowsy Driving:   * You don't remember driving the last few miles   * You may drift out of your lane   * You are unable to focus and your thoughts wander   * You may yawn more often than normal   * You have difficulty keeping your eyes open / nodding off   * Missing traffic signs, speeding, or tailgating  Prevention-   Good sleep hygiene, lifestyle and behavioral choices have the most impact on drowsy driving. There is no substitute for sleep and the average person requires 8 hours nightly. If you find yourself driving drowsy, stop and sleep. Consider the sleep hygiene tips provided during your visit as well.     Medication Refill Policy: Refills for all medications require 1 week advance notice. Please have your pharmacy fax a refill request. We are unable to fax, or call in "controled substance" medications and you will need to pick these prescriptions up from our office.     MyChart Activation    Thank you for requesting access to MyChart. Please follow the instructions below to securely access and download your online medical record. MyChart allows you to send messages to your doctor, view your test results, renew your prescriptions, schedule appointments, and more.    How Do I Sign Up?    1. In your internet browser, go to [email or web address].  2. Click on the First Time User? Click Here link in the Sign In box. You will see the New Member Sign Up page.  3. Enter your MyChart Access Code exactly as it appears below. You will not need to use this code after you???ve completed the sign-up process. If    you do not sign up before the expiration date, you must request a new code.    MyChart Access Code: Activation code not generated  Current MyChart Status: Active (This is the date your MyChart access code will expire)    4. Enter the last four digits of your Social Security Number (xxxx) and Date of Birth (mm/dd/yyyy) as indicated and click Submit. You will be taken to the next sign-up page.  5. Create a MyChart ID. This will be your MyChart login ID and cannot be changed, so think of one that is secure and easy to remember.  6. Create a MyChart password. You can change your password at any time.  7. Enter your Password Reset Question and Answer. This can be used at a later time if you forget your password.   8. Enter your e-mail address. You will receive e-mail notification when new information is available in MyChart.  9. Click Sign Up. You can now view and download portions of your medical record.  10. Click the Download Summary menu link to download a portable copy of your medical information.    Additional Information    If you have questions, please call [phone]. Remember, MyChart is NOT to be used for urgent needs. For medical emergencies, dial 911.

## 2018-02-18 DIAGNOSIS — G4733 Obstructive sleep apnea (adult) (pediatric): Secondary | ICD-10-CM

## 2018-02-19 ENCOUNTER — Inpatient Hospital Stay: Admit: 2018-02-19 | Payer: MEDICARE | Primary: Internal Medicine

## 2018-02-20 ENCOUNTER — Ambulatory Visit

## 2018-02-20 ENCOUNTER — Encounter

## 2018-02-20 ENCOUNTER — Ambulatory Visit: Admit: 2018-02-20 | Discharge: 2018-02-20 | Payer: MEDICARE | Primary: Internal Medicine

## 2018-02-20 DIAGNOSIS — I2510 Atherosclerotic heart disease of native coronary artery without angina pectoris: Secondary | ICD-10-CM

## 2018-02-22 LAB — TRANSTHORACIC ECHOCARDIOGRAM (TTE) COMPLETE (CONTRAST/BUBBLE/3D PRN)
AV Area by Peak Velocity: 1.9 cm2
AV Area by VTI: 2.1 cm2
AV Mean Gradient: 3.5 mmHg
AV Peak Gradient: 6.6 mmHg
AV Peak Velocity: 128.65 cm/s
AV VTI: 31.79 cm
AVA/BSA Peak Velocity: 1 cm2/m2
AVA/BSA VTI: 1.1 cm2/m2
Aortic Root: 2.88 cm
Ascending Aorta: 3.68 cm
E/E' Lateral: 13.64
E/E' Ratio (Averaged): 13.34
E/E' Septal: 13.03
EF BP: 64.3 % (ref 55–100)
Est. RA Pressure: 3 mmHg
IVSd: 1.08 cm — AB (ref 0.6–0.9)
LA Area 4C: 16 cm2
LA Major Axis: 4.04 cm
LA Volume 2C: 45.1 mL (ref 22–52)
LA Volume 4C: 40.13 mL (ref 22–52)
LA Volume BP: 46.76 mL (ref 22–52)
LA Volume Index 2C: 25.22 ml/m2 (ref 16–28)
LA Volume Index 4C: 22.44 ml/m2 (ref 16–28)
LA Volume Index BP: 26.15 ml/m2 (ref 16–28)
LA/AO Root Ratio: 1.4
LV E' Lateral Velocity: 6.88 cm/s
LV E' Septal Velocity: 7.2 cm/s
LV EDV A2C: 70.9 mL
LV EDV A4C: 78.3 mL
LV EDV BP: 74.9 ml (ref 56–104)
LV EDV Index A2C: 39.6 mL/m2
LV EDV Index A4C: 43.8 mL/m2
LV EDV Index BP: 41.9 mL/m2
LV ESV A2C: 25.9 mL
LV ESV A4C: 27.5 mL
LV ESV BP: 26.8 mL (ref 19–49)
LV ESV Index A2C: 14.5 mL/m2
LV ESV Index A4C: 15.4 mL/m2
LV ESV Index BP: 15 mL/m2
LV Ejection Fraction A2C: 63 %
LV Ejection Fraction A4C: 65 %
LV Mass 2D Index: 102 g/m2 (ref 43–95)
LV Mass 2D: 182.4 g — AB (ref 67–162)
LVIDd: 4.41 cm (ref 3.9–5.3)
LVIDs: 2.98 cm
LVOT Diameter: 1.86 cm
LVOT Peak Gradient: 3.2 mmHg
LVOT Peak Velocity: 89.36 cm/s
LVOT SV: 66.3 ml
LVOT VTI: 24.39 cm
LVPWd: 1.01 cm — AB (ref 0.6–0.9)
Left Ventricular Ejection Fraction: 63
MV A Velocity: 120.27 cm/s
MV Area by PHT: 3 cm2
MV E Velocity: 93.85 cm/s
MV E Wave Deceleration Time: 254.4 ms
MV E/A: 0.78
MV PHT: 73.8 ms
PASP: 32.9 mmHg
RVSP: 32.9 mmHg
TAPSE: 2.22 cm — AB (ref 1.5–2)
TR Max Velocity: 273.35 cm/s
TR Peak Gradient: 29.9 mmHg

## 2018-02-22 NOTE — Progress Notes (Signed)
Echocardiogram      Normal EF.    Brighton Pilley, MD

## 2018-02-22 NOTE — Progress Notes (Signed)
Called, left vm for pt to return call to office.     Echocardiogram      Normal EF.

## 2018-02-22 NOTE — Progress Notes (Signed)
Called, spoke to pt.  Two pt identifiers confirmed.   Pt informed per Dr. Lorella Nimrod that her echocardiogram results were normal with normal EF.  Pt verbalized understanding of information discussed w/ no further questions at this time.

## 2018-02-22 NOTE — Progress Notes (Signed)
Called, left vm for pt to return call to office.     Echocardiogram  ??  ??  Normal EF.

## 2018-02-22 NOTE — Progress Notes (Signed)
Echocardiogram      Normal EF.    Leslie Dzikowski, MD

## 2018-02-22 NOTE — Progress Notes (Signed)
Called, spoke to pt.  Two pt identifiers confirmed.   Pt informed per Dr. Doloresco that her echocardiogram results were normal with normal EF.  Pt verbalized understanding of information discussed w/ no further questions at this time.

## 2018-02-23 LAB — ECHO ADULT COMPLETE
AV Area by Peak Velocity: 1.9 cm2
AV Area by VTI: 2.1 cm2
AV Mean Gradient: 3.5 mmHg
AV Peak Gradient: 6.6 mmHg
AV Peak Velocity: 128.65 cm/s
AV VTI: 31.79 cm
AVA/BSA Peak Velocity: 1 cm2/m2
AVA/BSA VTI: 1.1 cm2/m2
Aortic Root: 2.88 cm
Ascending Aorta: 3.68 cm
E/E' Lateral: 13.64
E/E' Ratio (Averaged): 13.34
E/E' Septal: 13.03
EF BP: 64.3 % (ref 55–100)
Est. RA Pressure: 3 mmHg
IVSd: 1.08 cm — AB (ref 0.6–0.9)
LA Area 4C: 16 cm2
LA Major Axis: 4.04 cm
LA Volume 2C: 45.1 mL (ref 22–52)
LA Volume 4C: 40.13 mL (ref 22–52)
LA Volume BP: 46.76 mL (ref 22–52)
LA Volume Index 2C: 25.22 ml/m2 (ref 16–28)
LA Volume Index 4C: 22.44 ml/m2 (ref 16–28)
LA Volume Index BP: 26.15 ml/m2 (ref 16–28)
LA/AO Root Ratio: 1.4
LV E' Lateral Velocity: 6.88 cm/s
LV E' Septal Velocity: 7.2 cm/s
LV EDV A2C: 70.9 mL
LV EDV A4C: 78.3 mL
LV EDV BP: 74.9 ml (ref 56–104)
LV EDV Index A2C: 39.6 mL/m2
LV EDV Index A4C: 43.8 mL/m2
LV EDV Index BP: 41.9 mL/m2
LV ESV A2C: 25.9 mL
LV ESV A4C: 27.5 mL
LV ESV BP: 26.8 mL (ref 19–49)
LV ESV Index A2C: 14.5 mL/m2
LV ESV Index A4C: 15.4 mL/m2
LV ESV Index BP: 15 mL/m2
LV Ejection Fraction A2C: 63 %
LV Ejection Fraction A4C: 65 %
LV Mass 2D Index: 102 g/m2 (ref 43–95)
LV Mass 2D: 182.4 g — AB (ref 67–162)
LVIDd: 4.41 cm (ref 3.9–5.3)
LVIDs: 2.98 cm
LVOT Diameter: 1.86 cm
LVOT Peak Gradient: 3.2 mmHg
LVOT Peak Velocity: 89.36 cm/s
LVOT SV: 66.3 ml
LVOT VTI: 24.39 cm
LVPWd: 1.01 cm — AB (ref 0.6–0.9)
MV A Velocity: 120.27 cm/s
MV Area by PHT: 3 cm2
MV E Velocity: 93.85 cm/s
MV E Wave Deceleration Time: 254.4 ms
MV E/A: 0.78
MV PHT: 73.8 ms
PASP: 32.9 mmHg
RVSP: 32.9 mmHg
TAPSE: 2.22 cm — AB (ref 1.5–2.0)
TR Max Velocity: 273.35 cm/s
TR Peak Gradient: 29.9 mmHg

## 2018-02-25 ENCOUNTER — Encounter: Primary: Internal Medicine

## 2018-02-25 NOTE — Progress Notes (Signed)
Faxed sleep study results and clinical notes to PCP; Dr. Larey Days per patient request

## 2018-02-25 NOTE — Progress Notes (Signed)
Faxed sleep study results and clinical notes to PCP; Dr. Sara Mears per patient request

## 2018-02-25 NOTE — Telephone Encounter (Signed)
Results of sleep study in [facility]  Lead tech to convey results to patient  Attended polysomnogram showed an AHI of 4/hour and lowest oxygen saturation was 88%. This is consistent with no significant sleep apnea.     Based on these results, PAP therapy not indicated. The respiratory events she had were mild and definitely related to her sleeping on her back. She should avoid sleeping on her back and continue her efforts at weight loss. Her average heart rate during sleep was 46/minute. The lowest I saw  was 37/hour and did not appear to be related to sleep position or snoring/respiratory events.

## 2018-02-25 NOTE — Telephone Encounter (Signed)
Reviewed sleep study results with patient. She expressed understanding ie (sleep hygiene / positional therapy) and will call us if she has questions or concerns.

## 2018-02-27 NOTE — Telephone Encounter (Signed)
Patient calling to see if she could try another cholesterol medication other than pravastatin 40 mg since it causes her muscles to ache.  Patient also states that she doesn't think it is helping to lower her cholesterol levels.

## 2018-03-02 NOTE — Progress Notes (Signed)
Cardiology Office Note  Date: 03/03/2018   ID: Shanessa Hodak, DOB 06/28/1940, MRN [id number]  PCP: System, Pcp Not In  Primary Cardiologist: Nona Dell, MD   Chief Complaint  Patient presents with  . Cardiac follow-up    History of Present Illness: Jeanae Whitmill is a 77 y.o. female last seen in October 2018.  She presents for a routine visit. She does not report any angina symptoms or worsening shortness of breath with typical activities.  Since our last visit she has had evaluation by a cardiologist in [place] (her daughter lives there), Dr. Lorella Nimrod.  I reviewed interval records including the recent note from September.  She had a very complete work-up for bradycardia including sleep test that was reassuring, normal TSH, also GXT to exclude chronotropic incompetence.  It is not entirely clear that her bradycardia is symptom provoking and she does not look to be a candidate for pacemaker at this point.  She has had no frank syncope.  Lab work from January is outlined below.  She continues to follow with PCP.  She is following a diet through weight watchers, has lost at least 15 pounds.  He also reports compliance with her medications including Pravachol.  I personally reviewed her ECG today which shows sinus bradycardia in the 40s with low voltage.  Past Medical History:  Diagnosis Date  . Arthritis   . Asthma   . Bradycardia   . Cataracts, bilateral   . Cholelithiasis   . Coronary atherosclerosis of native coronary artery    Mild atherosclerosis  . Depression   . GERD (gastroesophageal reflux disease)   . History of positive PPD   . Hx of migraines   . Hyperlipidemia   . Hypothyroidism   . Osteopenia     Past Surgical History:  Procedure Laterality Date  . Bilateral tubal ligation    . Cataract surgery    . Colonic polyp resection      Current Outpatient Medications  Medication Sig Dispense Refill  . albuterol (PROVENTIL HFA;VENTOLIN HFA) 108  (90 Base) MCG/ACT inhaler Inhale into the lungs every 6 (six) hours as needed for wheezing or shortness of breath.    . Ascorbic Acid (VITAMIN C ER PO) Take 1 tablet by mouth daily.     . Calcium in Bone Mineral Cmplx (OSTIGEN) 350 MG MISC Take 2 each by mouth.     . Calcium-Magnesium 500-250 MG TABS Take 1 tablet by mouth daily.     . Cholecalciferol (VITAMIN D3) 5000 UNITS CAPS Take 1 capsule by mouth daily.    . Coenzyme Q10 (CO Q 10 PO) Take by mouth.    . fexofenadine (ALLEGRA) 180 MG tablet Take 180 mg by mouth daily.    . furosemide (LASIX) 20 MG tablet Take 1 tablet (20 mg total) by mouth as needed (Leg swelling). 30 tablet 3  . levothyroxine (SYNTHROID, LEVOTHROID) 112 MCG tablet Take 112 mcg by mouth daily before breakfast.    . liothyronine (CYTOMEL) 5 MCG tablet Take 5 mcg by mouth daily.    . montelukast (SINGULAIR) 10 MG tablet Take 10 mg by mouth at bedtime.    . Omega-3 Fatty Acids (FISH OIL) 1000 MG CAPS Take 1 capsule by mouth 2 (two) times daily.    . pravastatin (PRAVACHOL) 40 MG tablet Take 40 mg by mouth daily.    Marland Kitchen UNKNOWN TO PATIENT 2 time weekly allergy injection - doesn't know the name or dose     No current  facility-administered medications for this visit.    Allergies:  Ciprofloxacin   Social History: The patient  reports that she has never smoked. She has never used smokeless tobacco. She reports that she does not drink alcohol or use drugs.   ROS:  Please see the history of present illness. Otherwise, complete review of systems is positive for fatigue at times.  All other systems are reviewed and negative.   Physical Exam: VS:  BP 100/70   Ht 5\' 4"  (1.626 m)   Wt 160 lb (72.6 kg)   SpO2 98%   BMI 27.46 kg/m , BMI Body mass index is 27.46 kg/m.  Wt Readings from Last 3 Encounters:  03/03/18 160 lb (72.6 kg)  02/27/17 176 lb (79.8 kg)  02/26/16 172 lb (78 kg)    General: Elderly woman, appears comfortable at rest. HEENT: Conjunctiva and lids normal,  oropharynx clear. Neck: Supple, no elevated JVP or carotid bruits, no thyromegaly. Lungs: Clear to auscultation, nonlabored breathing at rest. Cardiac: Regular rate and rhythm, no S3 or significant systolic murmur. Abdomen: Soft, nontender, bowel sounds present, no guarding or rebound. Extremities: Trace ankle edema, distal pulses 2+. Skin: Warm and dry. Musculoskeletal: No kyphosis. Neuropsychiatric: Alert and oriented x3, affect grossly appropriate.  ECG: I personally reviewed the tracing from 02/27/2017 which showed sinus rhythm with leftward axis and decreased R wave progression.  Recent Labwork:  January 2019: Potassium 4.5, BUN 11, creatinine 0.68, AST 21, ALT 22, cholesterol 199, triglycerides 171, HDL 52, LDL 113, hemoglobin 14.6, platelets 289  Other Studies Reviewed Today:  Lexiscan Myoview 01/02/2016:  There was no ST segment deviation noted during stress.  The study is normal. There are no perfusion defects consistent with prior infarction or current ischemia.  This is a low risk study.  The left ventricular ejection fraction is normal (55-65%).  Assessment and Plan:  1.  Sinus bradycardia, not entirely clear that this is symptom provoking.  She is not on any AV nodal blockers.  She had a very complete work-up by a cardiologist in [place] as outlined above with normal TSH, normal sleep testing, and no evidence of chronotropic incompetence by GXT.  No clear indication for pacemaker, would continue with observation for now.  2.  History of mild coronary atherosclerosis, low risk Myoview in 2017 and no active angina symptoms.  Would continue statin therapy, diet and exercise.  3.  Mixed hyperlipidemia, on Pravachol.  She has lost about 15 pounds, LDL down to 113 per lab work with PCP earlier in the year.  4.  Hypothyroidism, on Synthroid with normal TSH.  Keep follow-up with PCP.  Current medicines were reviewed with the patient today.   Orders Placed This  Encounter  Procedures  . EKG 12-Lead    Disposition: Follow-up 1 year, sooner if needed.  Signed, Jonelle Sidle, MD, [facility] 03/03/2018 10:12 AM    [facility] at [facility] [address] [place], [place], [place] [address] Phone: [phone]; Fax: [phone]

## 2018-03-03 ENCOUNTER — Encounter: Payer: Self-pay | Admitting: Cardiology

## 2018-03-03 ENCOUNTER — Ambulatory Visit (INDEPENDENT_AMBULATORY_CARE_PROVIDER_SITE_OTHER): Payer: Medicare Other | Admitting: Cardiology

## 2018-03-03 VITALS — BP 100/70 | Ht 64.0 in | Wt 160.0 lb

## 2018-03-03 DIAGNOSIS — E039 Hypothyroidism, unspecified: Secondary | ICD-10-CM | POA: Diagnosis not present

## 2018-03-03 DIAGNOSIS — E782 Mixed hyperlipidemia: Secondary | ICD-10-CM | POA: Diagnosis not present

## 2018-03-03 DIAGNOSIS — I2510 Atherosclerotic heart disease of native coronary artery without angina pectoris: Secondary | ICD-10-CM

## 2018-03-03 DIAGNOSIS — R001 Bradycardia, unspecified: Secondary | ICD-10-CM

## 2018-03-03 NOTE — Patient Instructions (Signed)

## 2018-03-17 ENCOUNTER — Encounter

## 2018-03-17 MED ORDER — ROSUVASTATIN 5 MG TAB
5 mg | ORAL_TABLET | Freq: Every evening | ORAL | 5 refills | Status: DC
Start: 2018-03-17 — End: 2018-04-09

## 2018-03-17 NOTE — Telephone Encounter (Signed)
Doloresco, Loraine Leriche, MD  Rosamaria Lints, LPN 9 days ago      If she is not interested in statin then let her know risk of coronary artery disease. LDL goal of less than 100. If not Pravachol then Crestor 5 mg daily

## 2018-03-17 NOTE — Telephone Encounter (Signed)
rx approved by Dr Doloresco

## 2018-03-17 NOTE — Telephone Encounter (Signed)
Please call pt to give her a 2 month follow up appt with Dr Lorella Nimrod. Faxed new rx, mailed lab slip to pt.

## 2018-03-24 ENCOUNTER — Encounter

## 2018-04-02 ENCOUNTER — Inpatient Hospital Stay: Payer: MEDICARE | Attending: Vascular & Interventional Radiology | Primary: Internal Medicine

## 2018-04-02 ENCOUNTER — Inpatient Hospital Stay: Payer: MEDICARE | Attending: Internal Medicine | Primary: Internal Medicine

## 2018-04-09 MED ORDER — ROSUVASTATIN 5 MG TAB
5 mg | ORAL_TABLET | Freq: Every evening | ORAL | 2 refills | Status: DC
Start: 2018-04-09 — End: 2019-07-09

## 2018-04-09 NOTE — Telephone Encounter (Signed)
rx approved by Dr Doloresco

## 2018-05-06 ENCOUNTER — Encounter: Attending: Specialist | Primary: Internal Medicine

## 2018-05-07 ENCOUNTER — Encounter: Payer: MEDICARE | Attending: Diagnostic Radiology | Primary: Internal Medicine

## 2018-05-07 ENCOUNTER — Encounter: Payer: MEDICARE | Primary: Internal Medicine

## 2018-05-08 ENCOUNTER — Ambulatory Visit: Payer: MEDICARE | Primary: Internal Medicine

## 2018-05-08 ENCOUNTER — Inpatient Hospital Stay: Admit: 2018-05-08 | Payer: MEDICARE | Attending: Internal Medicine | Primary: Internal Medicine

## 2018-05-08 ENCOUNTER — Inpatient Hospital Stay: Admit: 2018-05-08 | Payer: MEDICARE | Attending: Diagnostic Radiology | Primary: Internal Medicine

## 2018-05-08 DIAGNOSIS — R1310 Dysphagia, unspecified: Secondary | ICD-10-CM

## 2018-05-08 DIAGNOSIS — R2681 Unsteadiness on feet: Secondary | ICD-10-CM

## 2018-05-08 MED ORDER — GADOTERATE MEGLUMINE 0.5 MMOL/ML IV SOLUTION
0.5 mmol/mL (376.9 mg/mL) | Freq: Once | INTRAVENOUS | Status: AC
Start: 2018-05-08 — End: 2018-05-08
  Administered 2018-05-08: 17:00:00 via INTRAVENOUS

## 2018-05-08 NOTE — Progress Notes (Signed)
 Progress Notes by Effie Karna MATSU, SLP at 05/08/18 1300                Author: Effie Karna MATSU, SLP  Service: --  Author Type: Speech Language Pathologist       Filed: 05/08/18 1306  Date of Service: 05/08/18 1300  Status: Signed          Editor: Effie Karna MATSU, SLP (Speech Language Pathologist)                                          [facility]   [address]   [place], [place]  [address]      Speech Pathology Modified barium swallow Study   Patient: Leslie Potter (77 y.o. female)   Date: 05/08/2018   Referring Provider:  Annah RAMAN        SUBJECTIVE:     Patient c/o   a knot in my throat about a month ago with just trouble swallowing since Sept 2019 plus gait issues.  She reports that she  just can not swallow saliva and has difficulty initiating dry swallow, but no issues with eating.    PMH: GERD-on PPI, small airways dz         OBJECTIVE:     Past Medical History:      Past Medical History:        Diagnosis  Date         ?  Arthritis            hands/knees         ?  Asthma       ?  Chronic obstructive pulmonary disease (HCC)       ?  Dysthymia  05/08/2015     ?  Family history of heart attack  05/08/2015     ?  GERD (gastroesophageal reflux disease)       ?  History of cholecystectomy  05/08/2015     ?  History of prediabetes  05/08/2015     ?  History of sinus bradycardia  05/08/2015     ?  Hx of diverticulitis of colon  05/08/2015     ?  Hyperlipidemia       ?  Hypothyroidism  05/08/2015     ?  Pure hypercholesterolemia  05/08/2015     ?  Small airways disease  10/06/2015         ?  Thyroid disease            Past Surgical History:         Procedure  Laterality  Date          ?  COLONOSCOPY  N/A  04/22/2017          COLONOSCOPY performed by Tawni Deward Gilmore Mickey., MD at [facility]          ?  COLORECTAL SCRN; HI RISK IND    04/22/2017                     ?  HX CATARACT REMOVAL  Bilateral  2010     ?  HX CHOLECYSTECTOMY    2016     ?  HX OOPHORECTOMY   Bilateral  2017     ?  HX OTHER SURGICAL    2012  colon polyp removed          ?  HX OTHER SURGICAL              ovarian cyst removed          ?  HX RETINAL DETACHMENT REPAIR  Right  2008          tears          ?  HX TUBAL LIGATION         ?  UPPER GI ENDOSCOPY,BIOPSY    01/06/2015                   Current Dietary Status:  Regular, thins   Radiologist: Avie   Film Views: Lateral   Patient Position: upright standing         Trial 1:  Trial 2:     Consistency Presented: Pudding;Solid;Thin liquid;Pill/Tablet        How Presented: Self-fed/presented;SLP-fed/presented;Spoon;Straw;Successive swallows         ORAL PHASE:        Bolus Acceptance: No impairment        Bolus Formation/Control: No impairment:     :       Propulsion: No impairment        Oral Residue: None    PHARYNGEAL PHASE:         Initiation of Swallow: No impairment        Timing: No impairment        Penetration: None        Aspiration/Timing: No evidence of aspiration        Pharyngeal Clearance: No residue                                                     Trial 3:  Trial 4:                                          :     :                                                                                                               Decreased Tongue Base Retraction?: No   Laryngeal Elevation: WFL (within functional limits)   Aspiration/Penetration Score: 1 (No penetration or aspiration-Contrast does not enter the airway)                               ASSESSMENT :   Based on the objective data described above , the patient presents with normal swallow. Symptoms may correlate with GERD. SABRA          PLAN/RECOMMENDATIONS :   Ok for diet as tolerated.    F/u with MD about  changing reflux meds or additional workup with GI if no improvement.           COMMUNICATION/EDUCATION:     The above findings and recommendations were discussed with: patient  who verbalized understanding.      Thank you for this referral.   Karna KANDICE Sink, SLP   Time Calculation:  15 mins

## 2018-05-08 NOTE — Progress Notes (Signed)
[facility]  [address]  [place], [place]  [id number]    Speech Pathology Modified barium swallow Study  Patient: Leslie FreestoneDarnell H Potter ([age] y.o. female)  Date: 05/08/2018  Referring Provider:  Juliann PulseMears, S    SUBJECTIVE:   Patient c/o  " a knot in my throat" about a month ago with "just trouble swallowing" since Sept 2019 plus gait issues.  She reports that she just can not swallow saliva and has difficulty initiating dry swallow, but no issues with eating.   PMH: GERD-on PPI, small airways dz     OBJECTIVE:   Past Medical History:   Past Medical History:   Diagnosis Date   ??? Arthritis     hands/knees   ??? Asthma    ??? Chronic obstructive pulmonary disease (HCC)    ??? Dysthymia 05/08/2015   ??? Family history of heart attack 05/08/2015   ??? GERD (gastroesophageal reflux disease)    ??? History of cholecystectomy 05/08/2015   ??? History of prediabetes 05/08/2015   ??? History of sinus bradycardia 05/08/2015   ??? Hx of diverticulitis of colon 05/08/2015   ??? Hyperlipidemia    ??? Hypothyroidism 05/08/2015   ??? Pure hypercholesterolemia 05/08/2015   ??? 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 Thyroid disease      Past Surgical History:   Procedure Laterality Date   ??? COLONOSCOPY N/A 04/22/2017    COLONOSCOPY performed by Herbert Moorsuckworth, Paul Frederick Jr., MD at [facility]   ??? COLORECTAL SCRN; HI RISK IND  04/22/2017        ??? HX CATARACT REMOVAL Bilateral 2010   ??? HX CHOLECYSTECTOMY  2016   ??? HX OOPHORECTOMY Bilateral 2017   ??? HX OTHER SURGICAL  2012    colon polyp removed   ??? HX OTHER SURGICAL      ovarian cyst removed   ??? HX RETINAL DETACHMENT REPAIR Right 2008    tears   ??? HX TUBAL LIGATION     ??? UPPER GI ENDOSCOPY,BIOPSY  01/06/2015          Current Dietary Status:  Regular, thins  Radiologist: Magnus IvanHabib  Film Views: Lateral  Patient Position: upright standing    Trial 1: Trial 2:   Consistency Presented: Pudding;Solid;Thin liquid;Pill/Tablet      How Presented: Self-fed/presented;SLP-fed/presented;Spoon;Straw;Successive swallows      ORAL PHASE:     Bolus Acceptance: No impairment     Bolus Formation/Control: No impairment:    :     Propulsion: No impairment     Oral Residue: None   PHARYNGEAL PHASE:      Initiation of Swallow: No impairment     Timing: No impairment     Penetration: None     Aspiration/Timing: No evidence of aspiration     Pharyngeal Clearance: No residue                               Trial 3: Trial 4:                            :    :  Decreased Tongue Base Retraction?: No  Laryngeal Elevation: WFL (within functional limits)  Aspiration/Penetration Score: 1 (No penetration or aspiration-Contrast does not enter the airway)                     ASSESSMENT :  Based on the objective data described above, the patient presents with normal swallow. Symptoms may correlate with GERD. Marland Kitchen.     PLAN/RECOMMENDATIONS :  Ok for diet as tolerated.   F/u with MD about changing reflux meds or additional workup with GI if no improvement.      COMMUNICATION/EDUCATION:   The above findings and recommendations were discussed with: patient who verbalized understanding.    Thank you for this referral.  Crist Infanteenise G Jamariya Davidoff, SLP  Time Calculation: 15 mins

## 2018-05-22 NOTE — Progress Notes (Signed)
Reviewed during office visit.

## 2018-05-22 NOTE — Progress Notes (Signed)
Lipids are at goal. No action needed. I would like the cholesterol checked again in 6 mo.Continue to eat healthy and clean.

## 2018-05-23 LAB — HEPATIC FUNCTION PANEL
ALT (SGPT): 14 IU/L (ref 0–32)
ALT: 14 IU/L (ref 0–32)
AST (SGOT): 17 IU/L (ref 0–40)
AST: 17 IU/L (ref 0–40)
Albumin: 4.5 g/dL (ref 3.5–4.8)
Albumin: 4.5 g/dL (ref 3.5–4.8)
Alk. phosphatase: 73 IU/L (ref 39–117)
Alkaline Phosphatase: 73 IU/L (ref 39–117)
Bilirubin, Direct: 0.13 mg/dL (ref 0.00–0.40)
Bilirubin, direct: 0.13 mg/dL (ref 0.00–0.40)
Bilirubin, total: 0.4 mg/dL (ref 0.0–1.2)
Protein, total: 6.6 g/dL (ref 6.0–8.5)
Total Bilirubin: 0.4 mg/dL (ref 0.0–1.2)
Total Protein: 6.6 g/dL (ref 6.0–8.5)

## 2018-05-23 LAB — LIPID PANEL
Cholesterol, Total: 164 mg/dL (ref 100–199)
Cholesterol, total: 164 mg/dL (ref 100–199)
HDL Cholesterol: 55 mg/dL (ref >39)
HDL: 55 mg/dL (ref >39)
LDL Calculated: 81 mg/dL (ref 0–99)
LDL, calculated: 81 mg/dL (ref 0–99)
Triglyceride: 139 mg/dL (ref 0–149)
Triglycerides: 139 mg/dL (ref 0–149)
VLDL Cholesterol Calculated: 28 mg/dL (ref 5–40)
VLDL, calculated: 28 mg/dL (ref 5–40)

## 2018-05-23 LAB — CVD REPORT

## 2018-06-01 ENCOUNTER — Ambulatory Visit: Attending: Specialist | Primary: Internal Medicine

## 2018-06-01 ENCOUNTER — Ambulatory Visit: Admit: 2018-06-01 | Discharge: 2018-06-01 | Payer: MEDICARE | Attending: Specialist | Primary: Internal Medicine

## 2018-06-01 ENCOUNTER — Encounter

## 2018-06-01 DIAGNOSIS — R413 Other amnesia: Secondary | ICD-10-CM

## 2018-06-01 NOTE — Progress Notes (Signed)
Neurology Consult      Subjective:      Leslie Potter is a 78 y.o. female who comes in today with the following complaint list.  Says she has noticed when she gets out of bed especially at night it takes a few seconds to get her bearings but otherwise when she is on her feet she may or may not feel dizzy but not fainty and is had no loss of consciousness.  Said another cardiologist checked her for orthostasis and that was negative.  Was told once that it could be an inner ear problem but her hearing is okay no manifest tinnitus and no ear pressure pain.  Has had formal cardiac testing including stress test Holter monitor normal TSH but has sinus bradycardia at times but not felt to be symptomatic?      Is also had episodic memory loss for 1 year and a family history is positive for her mom with the same.  She is a retired Designer, jewellery.    Has had issues with swallowing difficulties but had a normal brain MRI with a few mild white matter changes and a formal barium swallow test that was normal.  Had a sleep study that did not register obstructive sleep apnea and suggestions to limit supine sleeping.  Says basically she can swallow but may need a second swallow to clear her throat?  No choking as I understand it and does have a regular gastroenterologist that follows her?         Current Outpatient Medications   Medication Sig Dispense Refill   ??? rosuvastatin (CRESTOR) 5 mg tablet Take 1 Tab by mouth nightly. 90 Tab 2   ??? levothyroxine (SYNTHROID) 112 mcg tablet Take 112 mcg by mouth.     ??? cholecalciferol (VITAMIN D3) 2,000 unit cap capsule Vitamin D3 2,000 unit capsule   Take by oral route.     ??? traZODone (DESYREL) 50 mg tablet Take  by mouth nightly.     ??? MONTELUKAST SODIUM (SINGULAIR PO) Take  by mouth daily.     ??? fexofenadine (ALLEGRA) 180 mg tablet Take  by mouth daily.     ??? liothyronine (CYTOMEL) 5 mcg tablet Take 1 Tab by mouth daily. 90 Tab 4   ??? omeprazole (PRILOSEC) 40 mg capsule Take 1 Cap by  mouth daily. Indications: GASTROESOPHAGEAL REFLUX 90 Cap 6   ??? mirabegron ER (MYRBETRIQ) 50 mg ER tablet Myrbetriq 50 mg tablet,extended release   Take 1 tablet every day by oral route for 31 days.     ??? diclofenac (VOLTAREN) 1 % gel Apply two grams by topical route four times daily to the affected area (s).     ??? desmopressin (DDAVP) 0.1 mg tablet DDAVP 0.1 mg tablet   take one PO qhs     ??? cephALEXin (KEFLEX) 500 mg capsule TAKE 1 CAPSULE BY MOUTH EVERY 12 HOURS FOR 7 DAYS  0   ??? vitamin E (AQUA GEMS) 400 unit capsule vitamin E     ??? co-enzyme Q-10 (CO Q-10) 100 mg capsule Take 100 mg by mouth daily.     ??? magnesium 250 mg tab Take  by mouth.     ??? terbinafine HCl (LAMISIL) 250 mg tablet Take 250 mg by mouth daily.     ??? docusate sodium (COLACE) 100 mg capsule Take 100 mg by mouth as needed for Constipation.     ??? levothyroxine (SYNTHROID) 100 mcg tablet Take 112 mcg by mouth every other  day.     ??? OTHER Allergy shots - weekly.     ??? furosemide (LASIX) 20 mg tablet Take 20 mg by mouth as needed.     ??? cholecalciferol (VITAMIN D3) 5,000 unit capsule Take 1 Cap by mouth every Tuesday Thursday, Saturday.     ??? VENTOLIN HFA 90 mcg/actuation inhaler INHALE TWO PUFFS BY MOUTH EVERY 4 HOURS AS NEEDED FOR  WHEEZING 1 Inhaler 12   ??? Omega-3 Fatty Acids (FISH OIL) 500 mg cap Take  by mouth as needed.     ??? ascorbic acid (VITAMIN C) 1,000 mg tablet Take 1,000 mg by mouth as needed.     ??? calcium-magnesium-zinc Tab Take 1,000 mg by mouth as needed.        Allergies   Allergen Reactions   ??? Simvastatin Myalgia     Past Medical History:   Diagnosis Date   ??? Arthritis     hands/knees   ??? Asthma    ??? Asthma    ??? CAD (coronary artery disease)    ??? Chronic obstructive pulmonary disease (HCC)    ??? Diabetes (HCC)    ??? Dysthymia 05/08/2015   ??? Family history of heart attack 05/08/2015   ??? GERD (gastroesophageal reflux disease)    ??? Headache    ??? High cholesterol    ??? History of cholecystectomy 05/08/2015   ??? History of prediabetes  05/08/2015   ??? History of sinus bradycardia 05/08/2015   ??? Hx of diverticulitis of colon 05/08/2015   ??? Hyperlipidemia    ??? Hypothyroidism 05/08/2015   ??? Joint pain    ??? Memory disorder    ??? Pre-diabetes    ??? Pure hypercholesterolemia 05/08/2015   ??? Seasonal allergic reaction    ??? 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 Thyroid disease    ??? Thyroid disease       Past Surgical History:   Procedure Laterality Date   ??? COLONOSCOPY N/A 04/22/2017    COLONOSCOPY performed by Herbert Moors., MD at [facility]   ??? COLORECTAL SCRN; HI RISK IND  04/22/2017        ??? HX CATARACT REMOVAL Bilateral 2010   ??? HX CHOLECYSTECTOMY  2016   ??? HX OOPHORECTOMY Bilateral 2017   ??? HX OTHER SURGICAL  2012    colon polyp removed   ??? HX OTHER SURGICAL      ovarian cyst removed   ??? HX RETINAL DETACHMENT REPAIR Right 2008    tears   ??? HX TUBAL LIGATION     ??? UPPER GI ENDOSCOPY,BIOPSY  01/06/2015           Social History     Socioeconomic History   ??? Marital status: DIVORCED     Spouse name: Not on file   ??? Number of children: Not on file   ??? Years of education: Not on file   ??? Highest education level: Not on file   Occupational History   ??? Not on file   Social Needs   ??? Financial resource strain: Not on file   ??? Food insecurity:     Worry: Not on file     Inability: Not on file   ??? Transportation needs:     Medical: Not on file     Non-medical: Not on file   Tobacco Use   ??? Smoking status: Never Smoker   ??? Smokeless tobacco: Never Used   Substance and Sexual Activity   ??? Alcohol use: No   ???  Drug use: No   ??? Sexual activity: Never   Lifestyle   ??? Physical activity:     Days per week: Not on file     Minutes per session: Not on file   ??? Stress: Not on file   Relationships   ??? Social connections:     Talks on phone: Not on file     Gets together: Not on file     Attends religious service: Not on file     Active member of club or organization: Not on file     Attends meetings of clubs or organizations: Not on file     Relationship status:  Not on file   ??? Intimate partner violence:     Fear of current or ex partner: Not on file     Emotionally abused: Not on file     Physically abused: Not on file     Forced sexual activity: Not on file   Other Topics Concern   ??? Military Service Not Asked   ??? Blood Transfusions Not Asked   ??? Caffeine Concern Not Asked   ??? Occupational Exposure Not Asked   ??? Hobby Hazards Not Asked   ??? Sleep Concern Not Asked   ??? Stress Concern Not Asked   ??? Weight Concern Not Asked   ??? Special Diet Not Asked   ??? Back Care Not Asked   ??? Exercise Not Asked   ??? Bike Helmet Not Asked   ??? Seat Belt Not Asked   ??? Self-Exams Not Asked   Social History Narrative   ??? Not on file      Family History   Problem Relation Age of Onset   ??? Heart Disease Mother    ??? Heart Disease Father    ??? Heart Disease Sister    ??? Colon Cancer Sister    ??? Heart Disease Brother       Visit Vitals  BP 110/70   Pulse 81   Ht 5\' 4"  (1.626 m)   Wt 70.3 kg (155 lb)   SpO2 98%   BMI 26.61 kg/m??        Review of Systems:   A comprehensive review of systems was negative except for that written in the HPI.      Neuro Exam:     Appearance:  The patient is well developed, well nourished, provides a coherent history and is in no acute distress.   Mental Status: Oriented to time, place and person. Mood and affect appropriate.   Cranial Nerves:   Intact visual fields. Fundi are benign. PERLA, EOM's full, no nystagmus, no ptosis. Facial sensation is normal. Corneal reflexes are intact. Facial movement is symmetric. Hearing is normal bilaterally. Palate is midline with normal sternocleidomastoid and trapezius muscles are normal. Tongue is midline.   Motor:  5/5 strength in upper and lower proximal and distal muscles. Normal bulk and tone. No fasciculations.   Reflexes:   Deep tendon reflexes 1-2+/4 and symmetrical.   Sensory:    Slightly diminished distally to touch, pinprick and vibration with  position sense intact.   Gait:  Normal gait.  Romberg with some truncal sway    Tremor:   No tremor noted.   Cerebellar:  No cerebellar signs present.   Neurovascular:  Normal heart sounds and regular rhythm, peripheral pulses intact, and no carotid bruits.  Toes downgoing no clonus no Hoffman's.  Straight leg raising -90 degrees.  No Hoffman's.  Assessment:   Problem 1 memory difficulties.  Will check up with labs and formal neuropsych testing.  Further suggestions may follow.    Problem 2 dysphagia.  We will check some labs and has already had a normal formal barium swallow test, and sees GI as it is.  Could not make great promises here but will be more than glad to check.    Problem 3 gait dysfunction balance issues.  Check labs do an EMG nerve conduction of both legs and go from there. Thought about tilt table, but patient states already had orthostatics done by cardiology...      Plan:   Revisit 2 months  Signed by :  Chesley Noon IV MD

## 2018-06-01 NOTE — Progress Notes (Signed)
Neurology Consult      Subjective:      Leslie Potter is a 78 y.o. female who comes in today with the following complaint list.  Says she has noticed when she gets out of bed especially at night it takes a few seconds to get her bearings but otherwise when she is on her feet she may or may not feel dizzy but not fainty and is had no loss of consciousness.  Said another cardiologist checked her for orthostasis and that was negative.  Was told once that it could be an inner ear problem but her hearing is okay no manifest tinnitus and no ear pressure pain.  Has had formal cardiac testing including stress test Holter monitor normal TSH but has sinus bradycardia at times but not felt to be symptomatic?      Is also had episodic memory loss for 1 year and a family history is positive for her mom with the same.  She is a retired Designer, jewellery.    Has had issues with swallowing difficulties but had a normal brain MRI with a few mild white matter changes and a formal barium swallow test that was normal.  Had a sleep study that did not register obstructive sleep apnea and suggestions to limit supine sleeping.  Says basically she can swallow but may need a second swallow to clear her throat?  No choking as I understand it and does have a regular gastroenterologist that follows her?         Current Outpatient Medications   Medication Sig Dispense Refill   ??? rosuvastatin (CRESTOR) 5 mg tablet Take 1 Tab by mouth nightly. 90 Tab 2   ??? levothyroxine (SYNTHROID) 112 mcg tablet Take 112 mcg by mouth.     ??? cholecalciferol (VITAMIN D3) 2,000 unit cap capsule Vitamin D3 2,000 unit capsule   Take by oral route.     ??? traZODone (DESYREL) 50 mg tablet Take  by mouth nightly.     ??? MONTELUKAST SODIUM (SINGULAIR PO) Take  by mouth daily.     ??? fexofenadine (ALLEGRA) 180 mg tablet Take  by mouth daily.     ??? liothyronine (CYTOMEL) 5 mcg tablet Take 1 Tab by mouth daily. 90 Tab 4    ??? omeprazole (PRILOSEC) 40 mg capsule Take 1 Cap by mouth daily. Indications: GASTROESOPHAGEAL REFLUX 90 Cap 6   ??? mirabegron ER (MYRBETRIQ) 50 mg ER tablet Myrbetriq 50 mg tablet,extended release   Take 1 tablet every day by oral route for 31 days.     ??? diclofenac (VOLTAREN) 1 % gel Apply two grams by topical route four times daily to the affected area (s).     ??? desmopressin (DDAVP) 0.1 mg tablet DDAVP 0.1 mg tablet   take one PO qhs     ??? cephALEXin (KEFLEX) 500 mg capsule TAKE 1 CAPSULE BY MOUTH EVERY 12 HOURS FOR 7 DAYS  0   ??? vitamin E (AQUA GEMS) 400 unit capsule vitamin E     ??? co-enzyme Q-10 (CO Q-10) 100 mg capsule Take 100 mg by mouth daily.     ??? magnesium 250 mg tab Take  by mouth.     ??? terbinafine HCl (LAMISIL) 250 mg tablet Take 250 mg by mouth daily.     ??? docusate sodium (COLACE) 100 mg capsule Take 100 mg by mouth as needed for Constipation.     ??? levothyroxine (SYNTHROID) 100 mcg tablet Take 112 mcg by mouth every other  day.     ??? OTHER Allergy shots - weekly.     ??? furosemide (LASIX) 20 mg tablet Take 20 mg by mouth as needed.     ??? cholecalciferol (VITAMIN D3) 5,000 unit capsule Take 1 Cap by mouth every Tuesday Thursday, Saturday.     ??? VENTOLIN HFA 90 mcg/actuation inhaler INHALE TWO PUFFS BY MOUTH EVERY 4 HOURS AS NEEDED FOR  WHEEZING 1 Inhaler 12   ??? Omega-3 Fatty Acids (FISH OIL) 500 mg cap Take  by mouth as needed.     ??? ascorbic acid (VITAMIN C) 1,000 mg tablet Take 1,000 mg by mouth as needed.     ??? calcium-magnesium-zinc Tab Take 1,000 mg by mouth as needed.        Allergies   Allergen Reactions   ??? Simvastatin Myalgia     Past Medical History:   Diagnosis Date   ??? Arthritis     hands/knees   ??? Asthma    ??? Asthma    ??? CAD (coronary artery disease)    ??? Chronic obstructive pulmonary disease (HCC)    ??? Diabetes (HCC)    ??? Dysthymia 05/08/2015   ??? Family history of heart attack 05/08/2015   ??? GERD (gastroesophageal reflux disease)    ??? Headache    ??? High cholesterol     ??? History of cholecystectomy 05/08/2015   ??? History of prediabetes 05/08/2015   ??? History of sinus bradycardia 05/08/2015   ??? Hx of diverticulitis of colon 05/08/2015   ??? Hyperlipidemia    ??? Hypothyroidism 05/08/2015   ??? Joint pain    ??? Memory disorder    ??? Pre-diabetes    ??? Pure hypercholesterolemia 05/08/2015   ??? Seasonal allergic reaction    ??? 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 Thyroid disease    ??? Thyroid disease       Past Surgical History:   Procedure Laterality Date   ??? COLONOSCOPY N/A 04/22/2017    COLONOSCOPY performed by Herbert Moorsuckworth, Paul Frederick Jr., MD at [facility]   ??? COLORECTAL SCRN; HI RISK IND  04/22/2017        ??? HX CATARACT REMOVAL Bilateral 2010   ??? HX CHOLECYSTECTOMY  2016   ??? HX OOPHORECTOMY Bilateral 2017   ??? HX OTHER SURGICAL  2012    colon polyp removed   ??? HX OTHER SURGICAL      ovarian cyst removed   ??? HX RETINAL DETACHMENT REPAIR Right 2008    tears   ??? HX TUBAL LIGATION     ??? UPPER GI ENDOSCOPY,BIOPSY  01/06/2015           Social History     Socioeconomic History   ??? Marital status: DIVORCED     Spouse name: Not on file   ??? Number of children: Not on file   ??? Years of education: Not on file   ??? Highest education level: Not on file   Occupational History   ??? Not on file   Social Needs   ??? Financial resource strain: Not on file   ??? Food insecurity:     Worry: Not on file     Inability: Not on file   ??? Transportation needs:     Medical: Not on file     Non-medical: Not on file   Tobacco Use   ??? Smoking status: Never Smoker   ??? Smokeless tobacco: Never Used   Substance and Sexual Activity   ??? Alcohol use: No   ???  Drug use: No   ??? Sexual activity: Never   Lifestyle   ??? Physical activity:     Days per week: Not on file     Minutes per session: Not on file   ??? Stress: Not on file   Relationships   ??? Social connections:     Talks on phone: Not on file     Gets together: Not on file     Attends religious service: Not on file     Active member of club or organization: Not on file      Attends meetings of clubs or organizations: Not on file     Relationship status: Not on file   ??? Intimate partner violence:     Fear of current or ex partner: Not on file     Emotionally abused: Not on file     Physically abused: Not on file     Forced sexual activity: Not on file   Other Topics Concern   ??? Military Service Not Asked   ??? Blood Transfusions Not Asked   ??? Caffeine Concern Not Asked   ??? Occupational Exposure Not Asked   ??? Hobby Hazards Not Asked   ??? Sleep Concern Not Asked   ??? Stress Concern Not Asked   ??? Weight Concern Not Asked   ??? Special Diet Not Asked   ??? Back Care Not Asked   ??? Exercise Not Asked   ??? Bike Helmet Not Asked   ??? Seat Belt Not Asked   ??? Self-Exams Not Asked   Social History Narrative   ??? Not on file      Family History   Problem Relation Age of Onset   ??? Heart Disease Mother    ??? Heart Disease Father    ??? Heart Disease Sister    ??? Colon Cancer Sister    ??? Heart Disease Brother       Visit Vitals  BP 110/70   Pulse 81   Ht 5\' 4"  (1.626 m)   Wt 70.3 kg (155 lb)   SpO2 98%   BMI 26.61 kg/m??        Review of Systems:   A comprehensive review of systems was negative except for that written in the HPI.      Neuro Exam:     Appearance:  The patient is well developed, well nourished, provides a coherent history and is in no acute distress.   Mental Status: Oriented to time, place and person. Mood and affect appropriate.   Cranial Nerves:   Intact visual fields. Fundi are benign. PERLA, EOM's full, no nystagmus, no ptosis. Facial sensation is normal. Corneal reflexes are intact. Facial movement is symmetric. Hearing is normal bilaterally. Palate is midline with normal sternocleidomastoid and trapezius muscles are normal. Tongue is midline.   Motor:  5/5 strength in upper and lower proximal and distal muscles. Normal bulk and tone. No fasciculations.   Reflexes:   Deep tendon reflexes 1-2+/4 and symmetrical.   Sensory:    Slightly diminished distally to touch, pinprick and vibration  with  position sense intact.   Gait:  Normal gait.  Romberg with some truncal sway   Tremor:   No tremor noted.   Cerebellar:  No cerebellar signs present.   Neurovascular:  Normal heart sounds and regular rhythm, peripheral pulses intact, and no carotid bruits.  Toes downgoing no clonus no Hoffman's.  Straight leg raising -90 degrees.  No Hoffman's.  Assessment:   Problem 1 memory difficulties.  Will check up with labs and formal neuropsych testing.  Further suggestions may follow.    Problem 2 dysphagia.  We will check some labs and has already had a normal formal barium swallow test, and sees GI as it is.  Could not make great promises here but will be more than glad to check.    Problem 3 gait dysfunction balance issues.  Check labs do an EMG nerve conduction of both legs and go from there. Thought about tilt table, but patient states already had orthostatics done by cardiology...      Plan:   Revisit 2 months  Signed by :  Chesley Noon IV MD

## 2018-06-01 NOTE — Patient Instructions (Signed)
Patient with multiple complaints.  He goes to swallowing changes memory performance and gait difficulties.  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  Has already invested time and energy and resources and other tests as will be stipulated.  Revisit once testing is accomplished.

## 2018-06-03 ENCOUNTER — Ambulatory Visit: Attending: Specialist | Primary: Internal Medicine

## 2018-06-03 ENCOUNTER — Ambulatory Visit: Admit: 2018-06-03 | Discharge: 2018-06-03 | Payer: MEDICARE | Attending: Specialist | Primary: Internal Medicine

## 2018-06-03 DIAGNOSIS — R001 Bradycardia, unspecified: Secondary | ICD-10-CM

## 2018-06-03 NOTE — Progress Notes (Signed)
Progress Notes by Vennie Homans, MD at 06/03/18 1620                Author: Vennie Homans, MD  Service: --  Author Type: Physician       Filed: 06/03/18 1704  Encounter Date: 06/03/2018  Status: Signed          Editor: Vennie Homans, MD (Physician)                       LAST OFFICE VISIT : 01/21/18                  ICD-10-CM  ICD-9-CM          1.  Bradycardia  R00.1  427.89     2.  Dyslipidemia  E78.5  272.4          3.  Fatigue, unspecified type  R53.83  780.79                  Leslie Potter is a 78 y.o.  female with hypercholesterolemia and family hx of MI, dyslipidemia last seen by me 4 months ago.      Cardiac risk factors: dyslipidemia, obesity, post-menopausal, family hx   I have personally obtained the history from the patient.        HISTORY OF PRESENTING ILLNESS        Leslie Potter reports she is feeling well, except for recent myalgias related to Crestor use. She stopped taking it and the burning has gone  away.      She has a FitBit as is measuring her steps while walking for exercise, averaging 35,000 steps per week. She still experiencing leg swelling.      The patient denies chest pain, dyspnea, orthopnea, PND, palpitations, syncope, presyncope or fatigue.              ACTIVE PROBLEM LIST          Patient Active Problem List           Diagnosis  Date Noted         ?  Small airways disease  10/06/2015     ?  Hypothyroidism  05/08/2015     ?  Pure hypercholesterolemia  05/08/2015     ?  History of sinus bradycardia  05/08/2015     ?  Family history of heart attack  05/08/2015     ?  History of prediabetes  05/08/2015     ?  Hx of diverticulitis of colon  05/08/2015     ?  History of cholecystectomy  05/08/2015         ?  Dysthymia  05/08/2015                  PAST MEDICAL HISTORY          Past Medical History:        Diagnosis  Date         ?  Arthritis            hands/knees         ?  Asthma       ?  Asthma       ?  CAD (coronary artery disease)       ?  Chronic obstructive  pulmonary disease (HCC)       ?  Diabetes ([place])       ?  Dysthymia  05/08/2015     ?  Family history of heart attack  05/08/2015     ?  GERD (gastroesophageal reflux disease)       ?  Headache       ?  High cholesterol       ?  History of cholecystectomy  05/08/2015     ?  History of prediabetes  05/08/2015     ?  History of sinus bradycardia  05/08/2015     ?  Hx of diverticulitis of colon  05/08/2015     ?  Hyperlipidemia       ?  Hypothyroidism  05/08/2015     ?  Joint pain       ?  Memory disorder       ?  Pre-diabetes       ?  Pure hypercholesterolemia  05/08/2015     ?  Seasonal allergic reaction       ?  Small airways disease  10/06/2015     ?  Thyroid disease           ?  Thyroid disease                    PAST SURGICAL HISTORY          Past Surgical History:         Procedure  Laterality  Date          ?  COLONOSCOPY  N/A  04/22/2017          COLONOSCOPY performed by Eugenie Filler., MD at [facility]          ?  COLORECTAL SCRN; HI RISK IND    04/22/2017                     ?  HX CATARACT REMOVAL  Bilateral  2010     ?  HX CHOLECYSTECTOMY    2016     ?  HX OOPHORECTOMY  Bilateral  2017     ?  HX OTHER SURGICAL    2012          colon polyp removed          ?  HX OTHER SURGICAL              ovarian cyst removed          ?  HX RETINAL DETACHMENT REPAIR  Right  2008          tears          ?  HX TUBAL LIGATION         ?  UPPER GI ENDOSCOPY,BIOPSY    01/06/2015                            ALLERGIES          Allergies        Allergen  Reactions         ?  Simvastatin  Myalgia                 FAMILY HISTORY          Family History         Problem  Relation  Age of Onset          ?  Heart Disease  Mother       ?  Heart Disease  Father       ?  Heart Disease  Sister       ?  Colon Cancer  Sister            ?  Heart Disease  Brother        negative for cardiac disease            SOCIAL HISTORY          Social History          Socioeconomic History         ?  Marital status:  DIVORCED              Spouse  name:  Not on file         ?  Number of children:  Not on file     ?  Years of education:  Not on file     ?  Highest education level:  Not on file       Tobacco Use         ?  Smoking status:  Never Smoker     ?  Smokeless tobacco:  Never Used       Substance and Sexual Activity         ?  Alcohol use:  No     ?  Drug use:  No     ?  Sexual activity:  Never        Other Topics  Concern                MEDICATIONS          Current Outpatient Medications        Medication  Sig         ?  rosuvastatin (CRESTOR) 5 mg tablet  Take 1 Tab by mouth nightly.     ?  levothyroxine (SYNTHROID) 112 mcg tablet  Take 112 mcg by mouth.     ?  diclofenac (VOLTAREN) 1 % gel  Apply two grams by topical route four times daily to the affected area (s).     ?  vitamin E (AQUA GEMS) 400 unit capsule  vitamin E     ?  co-enzyme Q-10 (CO Q-10) 100 mg capsule  Take 100 mg by mouth daily.     ?  magnesium 250 mg tab  Take  by mouth.     ?  traZODone (DESYREL) 50 mg tablet  Take  by mouth nightly.     ?  docusate sodium (COLACE) 100 mg capsule  Take 100 mg by mouth as needed for Constipation.     ?  OTHER  Allergy shots - weekly.     ?  furosemide (LASIX) 20 mg tablet  Take 20 mg by mouth as needed.     ?  cholecalciferol (VITAMIN D3) 5,000 unit capsule  Take 1 Cap by mouth every Tuesday Thursday, Saturday.     ?  VENTOLIN HFA 90 mcg/actuation inhaler  INHALE TWO PUFFS BY MOUTH EVERY 4 HOURS AS NEEDED FOR  WHEEZING     ?  Omega-3 Fatty Acids (FISH OIL) 500 mg cap  Take  by mouth as needed.     ?  MONTELUKAST SODIUM (SINGULAIR PO)  Take  by mouth daily.     ?  fexofenadine (ALLEGRA) 180 mg tablet  Take  by mouth daily.     ?  liothyronine (CYTOMEL) 5 mcg tablet  Take 1 Tab by mouth daily.     ?  omeprazole (PRILOSEC) 40 mg capsule  Take 1 Cap  by mouth daily. Indications: GASTROESOPHAGEAL REFLUX     ?  ascorbic acid (VITAMIN C) 1,000 mg tablet  Take 1,000 mg by mouth as needed.     ?  calcium-magnesium-zinc Tab  Take 1,000 mg by mouth as needed.      ?  cholecalciferol (VITAMIN D3) 2,000 unit cap capsule  Vitamin D3 2,000 unit capsule    Take by oral route.     ?  mirabegron ER (MYRBETRIQ) 50 mg ER tablet  Myrbetriq 50 mg tablet,extended release    Take 1 tablet every day by oral route for 31 days.     ?  desmopressin (DDAVP) 0.1 mg tablet  DDAVP 0.1 mg tablet    take one PO qhs     ?  cephALEXin (KEFLEX) 500 mg capsule  TAKE 1 CAPSULE BY MOUTH EVERY 12 HOURS FOR 7 DAYS     ?  terbinafine HCl (LAMISIL) 250 mg tablet  Take 250 mg by mouth daily.         ?  levothyroxine (SYNTHROID) 100 mcg tablet  Take 112 mcg by mouth every other day.          No current facility-administered medications for this visit.            I have reviewed the nurses notes, vitals, problem list, allergy list, medical history, family, social history and medications.            REVIEW OF SYMPTOMS         General: Pt denies excessive weight gain or loss. Pt is able to conduct ADL's   HEENT: Denies blurred vision, headaches, hearing loss, epistaxis and difficulty swallowing.   Respiratory: Denies cough, congestion, SOB, wheezing or stridor.    Cardiovascular: Denies precordial pain, palpitations, or PND.  +LE edema.    Gastrointestinal: Denies poor appetite, indigestion, abdominal pain or blood in stool   Genitourinary: Denies hematuria, dysuria, increased urinary frequency   Musculoskeletal: Denies joint pain or swelling from muscles or joints   Neurologic: Denies tremor, paresthesias, headache, or sensory motor disturbance   Psychiatric: Denies confusion, insomnia, depression   Integumentray: Denies rash, itching or ulcers.   Hematologic: Denies easy bruising, bleeding         PHYSICAL EXAMINATION           Vitals:          06/03/18 1622        BP:  110/70     Pulse:  (!) 52     Resp:  16     SpO2:  96%     Weight:  155 lb (70.3 kg)        Height:  _0  (1.626 m)        General: Well developed, in no acute distress.   HEENT: No jaundice, oral mucosa moist, no oral ulcers   Neck:  Supple, no stiffness, no lymphadenopathy, supple   Heart: +bradycardia   Respiratory: Clear bilaterally x 4, no wheezing or rales   Abdomen:?? ??Soft, non-tender, bowel sounds are active.??   Extremities:  No edema, normal cap refill, no cyanosis.   Musculoskeletal: No clubbing, no deformities   Neuro: A&Ox3, speech clear, gait stable, cooperative, no focal neurologic deficits   Skin: Skin color is normal. No rashes or lesions. Non diaphoretic, moist.   Vascular: 2+ pulses symmetric in all extremities            EKG: SB         DIAGNOSTIC DATA  1. Stress Test   Lexiscan- 01/02/2016- no perfusion defects consistent with prior infarction or current ischemia, EF (55-65%).      2. Echo    12/10/12- EF 60-65%   07/14/14- EF 60%, LAE mild   02/20/18-EF 61 - 65%, mild mitral annular calcification, trace MR, Mild AV sclerosis with no significant stenosis      3. Lipids   06/09/17- TC 199, HDL 52, LDL 113, TG 171   09/30/17- TC 238, HDL 49, LDL 128, TG 307   01/06/18- TC 220, HDL 48, LDL 119, TG 264   05/22/18- TC 164, HDL 55, LDL 81, TG 139      4. Calcium Score   10/01/17 -   The coronary calcium in each vessel is as follows:   ??   Left main coronary artery: 0   Left anterior descending coronary artery: 28   Left circumflex coronary artery: 1   Right coronary artery: 0   Posterior descending coronary artery: 0   ??   Total calcium score: 29               LABORATORY DATA                    Lab Results         Component  Value  Date/Time            WBC  6.5  06/09/2017 09:33 AM       HGB  14.6  06/09/2017 09:33 AM       HCT  42.4  06/09/2017 09:33 AM       PLATELET  289  06/09/2017 09:33 AM            MCV  90  06/09/2017 09:33 AM           Lab Results         Component  Value  Date/Time            Sodium  143  06/09/2017 09:33 AM       Potassium  4.5  06/09/2017 09:33 AM       Chloride  105  06/09/2017 09:33 AM       CO2  24  06/09/2017 09:33 AM       Anion gap  5  09/08/2008 02:40 PM       Glucose  105 (H)  06/09/2017 09:33 AM        BUN  11  06/09/2017 09:33 AM       Creatinine  0.68  06/09/2017 09:33 AM       BUN/Creatinine ratio  16  06/09/2017 09:33 AM       GFR est AA  97  06/09/2017 09:33 AM       GFR est non-AA  84  06/09/2017 09:33 AM       Calcium  9.1  06/09/2017 09:33 AM       Bilirubin, total  0.4  05/22/2018 09:28 AM       AST (SGOT)  17  05/22/2018 09:28 AM       Alk. phosphatase  73  05/22/2018 09:28 AM       Protein, total  6.6  05/22/2018 09:28 AM       Albumin  4.5  05/22/2018 09:28 AM       Globulin  3.1  09/08/2008 02:40 PM       A-G Ratio  1.7  06/09/2017 09:33 AM  ALT (SGPT)  14  05/22/2018 09:28 AM                  ASSESSMENT/RECOMMENDATIONS:.         1. Bradycardia.   - No significant arrhthymias on her monitor.    - She is now seeing a neurologist for her unstable gait.        2. Dyslipidemia on Crestor    - She is getting neuropathy. Will stop and get a CPK.      3. Possible OSA with periods of apnea per her daughter.    - Sleep study was negative.      Return in 3 months or PRN.        Orders Placed This Encounter        ?  LIPID PANEL              Standing Status:    Future         Standing Expiration Date:    06/04/2019        ?  HEPATIC FUNCTION PANEL              Standing Status:    Future              Standing Expiration Date:    06/04/2019                 Follow-up and Dispositions    ??       Return in about 3 months (around 09/02/2018).                    I have discussed the diagnosis with   Deno Lunger Yingling and the intended plan as seen in the above orders.  Questions were answered concerning future plans.  I have  discussed medication side effects and warnings with the patient as well.      Thank you,  Eliberto Ivory, MD for involving me in the care of  [place] . Please do not hesitate to contact me for further questions/concerns.       Written by Maryan Rued, as dictated by Vennie Homans, MD.        Jeannette How.Durelle Zepeda,  MD, [facility]      Patient Care Team:   Eliberto Ivory, MD as PCP -  General (Internal Medicine)   Sheral Flow Grayling Congress, MD as Surgeon (General Surgery)   Kenton Kingfisher, MD (Endocrinology)      [facility]       [address] [place]      [place], [place]       [phone] / [phone] Fax

## 2018-06-03 NOTE — Progress Notes (Signed)
 Visit Vitals  BP 110/70 (BP 1 Location: Left arm, BP Patient Position: Sitting)   Pulse (!) 52   Resp 16   Ht 5' 4 (1.626 m)   Wt 155 lb (70.3 kg)   SpO2 96%   BMI 26.61 kg/m

## 2018-06-03 NOTE — Progress Notes (Signed)
LAST OFFICE VISIT : 01/21/18        ICD-10-CM ICD-9-CM   1. Bradycardia R00.1 427.89   2. Dyslipidemia E78.5 272.4   3. Fatigue, unspecified type R53.83 780.79            Leslie Potter is a 78 y.o. female with hypercholesterolemia and family hx of MI, dyslipidemia last seen by me 4 months ago.    Cardiac risk factors: dyslipidemia, obesity, post-menopausal, family hx  I have personally obtained the history from the patient.    HISTORY OF PRESENTING ILLNESS     Leslie Potter reports she is feeling well, except for recent myalgias related to Crestor use. She stopped taking it and the burning has gone away.    She has a FitBit as is measuring her steps while walking for exercise, averaging 35,000 steps per week. She still experiencing leg swelling.    The patient denies chest pain, dyspnea, orthopnea, PND, palpitations, syncope, presyncope or fatigue.         ACTIVE PROBLEM LIST     Patient Active Problem List    Diagnosis Date Noted   ??? 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 Hypothyroidism 05/08/2015   ??? Pure hypercholesterolemia 05/08/2015   ??? History of sinus bradycardia 05/08/2015   ??? Family history of heart attack 05/08/2015   ??? History of prediabetes 05/08/2015   ??? Hx of diverticulitis of colon 05/08/2015   ??? History of cholecystectomy 05/08/2015   ??? Dysthymia 05/08/2015           PAST MEDICAL HISTORY     Past Medical History:   Diagnosis Date   ??? Arthritis     hands/knees   ??? Asthma    ??? Asthma    ??? CAD (coronary artery disease)    ??? Chronic obstructive pulmonary disease ([place])    ??? Diabetes ([place])    ??? Dysthymia 05/08/2015   ??? Family history of heart attack 05/08/2015   ??? GERD (gastroesophageal reflux disease)    ??? Headache    ??? High cholesterol    ??? History of cholecystectomy 05/08/2015   ??? History of prediabetes 05/08/2015   ??? History of sinus bradycardia 05/08/2015   ??? Hx of diverticulitis of colon 05/08/2015   ??? Hyperlipidemia    ??? Hypothyroidism 05/08/2015   ??? Joint pain    ??? Memory disorder    ???  Pre-diabetes    ??? Pure hypercholesterolemia 05/08/2015   ??? Seasonal allergic reaction    ??? 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 Thyroid disease    ??? Thyroid disease            PAST SURGICAL HISTORY     Past Surgical History:   Procedure Laterality Date   ??? COLONOSCOPY N/A 04/22/2017    COLONOSCOPY performed by Eugenie Filler., MD at [facility]   ??? COLORECTAL SCRN; HI RISK IND  04/22/2017        ??? HX CATARACT REMOVAL Bilateral 2010   ??? HX CHOLECYSTECTOMY  2016   ??? HX OOPHORECTOMY Bilateral 2017   ??? HX OTHER SURGICAL  2012    colon polyp removed   ??? HX OTHER SURGICAL      ovarian cyst removed   ??? HX RETINAL DETACHMENT REPAIR Right 2008    tears   ??? HX TUBAL LIGATION     ??? UPPER GI ENDOSCOPY,BIOPSY  01/06/2015               ALLERGIES  Allergies   Allergen Reactions   ??? Simvastatin Myalgia          FAMILY HISTORY     Family History   Problem Relation Age of Onset   ??? Heart Disease Mother    ??? Heart Disease Father    ??? Heart Disease Sister    ??? Colon Cancer Sister    ??? Heart Disease Brother     negative for cardiac disease       SOCIAL HISTORY     Social History     Socioeconomic History   ??? Marital status: DIVORCED     Spouse name: Not on file   ??? Number of children: Not on file   ??? Years of education: Not on file   ??? Highest education level: Not on file   Tobacco Use   ??? Smoking status: Never Smoker   ??? Smokeless tobacco: Never Used   Substance and Sexual Activity   ??? Alcohol use: No   ??? Drug use: No   ??? Sexual activity: Never   Other Topics Concern         MEDICATIONS     Current Outpatient Medications   Medication Sig   ??? rosuvastatin (CRESTOR) 5 mg tablet Take 1 Tab by mouth nightly.   ??? levothyroxine (SYNTHROID) 112 mcg tablet Take 112 mcg by mouth.   ??? diclofenac (VOLTAREN) 1 % gel Apply two grams by topical route four times daily to the affected area (s).   ??? vitamin E (AQUA GEMS) 400 unit capsule vitamin E   ??? co-enzyme Q-10 (CO Q-10) 100 mg capsule Take 100 mg by mouth daily.   ??? magnesium 250  mg tab Take  by mouth.   ??? traZODone (DESYREL) 50 mg tablet Take  by mouth nightly.   ??? docusate sodium (COLACE) 100 mg capsule Take 100 mg by mouth as needed for Constipation.   ??? OTHER Allergy shots - weekly.   ??? furosemide (LASIX) 20 mg tablet Take 20 mg by mouth as needed.   ??? cholecalciferol (VITAMIN D3) 5,000 unit capsule Take 1 Cap by mouth every Tuesday Thursday, Saturday.   ??? VENTOLIN HFA 90 mcg/actuation inhaler INHALE TWO PUFFS BY MOUTH EVERY 4 HOURS AS NEEDED FOR  WHEEZING   ??? Omega-3 Fatty Acids (FISH OIL) 500 mg cap Take  by mouth as needed.   ??? MONTELUKAST SODIUM (SINGULAIR PO) Take  by mouth daily.   ??? fexofenadine (ALLEGRA) 180 mg tablet Take  by mouth daily.   ??? liothyronine (CYTOMEL) 5 mcg tablet Take 1 Tab by mouth daily.   ??? omeprazole (PRILOSEC) 40 mg capsule Take 1 Cap by mouth daily. Indications: GASTROESOPHAGEAL REFLUX   ??? ascorbic acid (VITAMIN C) 1,000 mg tablet Take 1,000 mg by mouth as needed.   ??? calcium-magnesium-zinc Tab Take 1,000 mg by mouth as needed.   ??? cholecalciferol (VITAMIN D3) 2,000 unit cap capsule Vitamin D3 2,000 unit capsule   Take by oral route.   ??? mirabegron ER (MYRBETRIQ) 50 mg ER tablet Myrbetriq 50 mg tablet,extended release   Take 1 tablet every day by oral route for 31 days.   ??? desmopressin (DDAVP) 0.1 mg tablet DDAVP 0.1 mg tablet   take one PO qhs   ??? cephALEXin (KEFLEX) 500 mg capsule TAKE 1 CAPSULE BY MOUTH EVERY 12 HOURS FOR 7 DAYS   ??? terbinafine HCl (LAMISIL) 250 mg tablet Take 250 mg by mouth daily.   ??? levothyroxine (SYNTHROID) 100 mcg tablet Take 112  mcg by mouth every other day.     No current facility-administered medications for this visit.        I have reviewed the nurses notes, vitals, problem list, allergy list, medical history, family, social history and medications.       REVIEW OF SYMPTOMS      General: Pt denies excessive weight gain or loss. Pt is able to conduct ADL's  HEENT: Denies blurred vision, headaches, hearing loss, epistaxis and  difficulty swallowing.  Respiratory: Denies cough, congestion, SOB, wheezing or stridor.   Cardiovascular: Denies precordial pain, palpitations, or PND.  +LE edema.   Gastrointestinal: Denies poor appetite, indigestion, abdominal pain or blood in stool  Genitourinary: Denies hematuria, dysuria, increased urinary frequency  Musculoskeletal: Denies joint pain or swelling from muscles or joints  Neurologic: Denies tremor, paresthesias, headache, or sensory motor disturbance  Psychiatric: Denies confusion, insomnia, depression  Integumentray: Denies rash, itching or ulcers.  Hematologic: Denies easy bruising, bleeding     PHYSICAL EXAMINATION      Vitals:    06/03/18 1622   BP: 110/70   Pulse: (!) 52   Resp: 16   SpO2: 96%   Weight: 155 lb (70.3 kg)   Height: 5' 4"  (1.626 m)     General: Well developed, in no acute distress.  HEENT: No jaundice, oral mucosa moist, no oral ulcers  Neck: Supple, no stiffness, no lymphadenopathy, supple  Heart: +bradycardia  Respiratory: Clear bilaterally x 4, no wheezing or rales  Abdomen:?? ??Soft, non-tender, bowel sounds are active.??  Extremities:  No edema, normal cap refill, no cyanosis.  Musculoskeletal: No clubbing, no deformities  Neuro: A&Ox3, speech clear, gait stable, cooperative, no focal neurologic deficits  Skin: Skin color is normal. No rashes or lesions. Non diaphoretic, moist.  Vascular: 2+ pulses symmetric in all extremities        EKG: SB     DIAGNOSTIC DATA     1. Stress Test  Lexiscan- 01/02/2016- no perfusion defects consistent with prior infarction or current ischemia, EF (55-65%).    2. Echo   12/10/12- EF 60-65%  07/14/14- EF 60%, LAE mild  02/20/18-EF 61 - 65%, mild mitral annular calcification, trace MR, Mild AV sclerosis with no significant stenosis    3. Lipids  06/09/17- TC 199, HDL 52, LDL 113, TG 171  09/30/17- TC 238, HDL 49, LDL 128, TG 307  01/06/18- TC 220, HDL 48, LDL 119, TG 264  05/22/18- TC 164, HDL 55, LDL 81, TG 139    4. Calcium Score  10/01/17 -  The  coronary calcium in each vessel is as follows:  ??  Left main coronary artery: 0  Left anterior descending coronary artery: 28  Left circumflex coronary artery: 1  Right coronary artery: 0  Posterior descending coronary artery: 0  ??  Total calcium score: 29         LABORATORY DATA            Lab Results   Component Value Date/Time    WBC 6.5 06/09/2017 09:33 AM    HGB 14.6 06/09/2017 09:33 AM    HCT 42.4 06/09/2017 09:33 AM    PLATELET 289 06/09/2017 09:33 AM    MCV 90 06/09/2017 09:33 AM      Lab Results   Component Value Date/Time    Sodium 143 06/09/2017 09:33 AM    Potassium 4.5 06/09/2017 09:33 AM    Chloride 105 06/09/2017 09:33 AM    CO2 24 06/09/2017 09:33 AM  Anion gap 5 09/08/2008 02:40 PM    Glucose 105 (H) 06/09/2017 09:33 AM    BUN 11 06/09/2017 09:33 AM    Creatinine 0.68 06/09/2017 09:33 AM    BUN/Creatinine ratio 16 06/09/2017 09:33 AM    GFR est AA 97 06/09/2017 09:33 AM    GFR est non-AA 84 06/09/2017 09:33 AM    Calcium 9.1 06/09/2017 09:33 AM    Bilirubin, total 0.4 05/22/2018 09:28 AM    AST (SGOT) 17 05/22/2018 09:28 AM    Alk. phosphatase 73 05/22/2018 09:28 AM    Protein, total 6.6 05/22/2018 09:28 AM    Albumin 4.5 05/22/2018 09:28 AM    Globulin 3.1 09/08/2008 02:40 PM    A-G Ratio 1.7 06/09/2017 09:33 AM    ALT (SGPT) 14 05/22/2018 09:28 AM           ASSESSMENT/RECOMMENDATIONS:.      1. Bradycardia.  - No significant arrhthymias on her monitor.   - She is now seeing a neurologist for her unstable gait.      2. Dyslipidemia on Crestor   - She is getting neuropathy. Will stop and get a CPK.    3. Possible OSA with periods of apnea per her daughter.   - Sleep study was negative.    Return in 3 months or PRN.    Orders Placed This Encounter   ??? LIPID PANEL     Standing Status:   Future     Standing Expiration Date:   06/04/2019   ??? HEPATIC FUNCTION PANEL     Standing Status:   Future     Standing Expiration Date:   06/04/2019          Follow-up and Dispositions  ??   Return in about 3 months  (around 09/02/2018).           I have discussed the diagnosis with  Deno Lunger Boerema and the intended plan as seen in the above orders.  Questions were answered concerning future plans.  I have discussed medication side effects and warnings with the patient as well.    Thank you,  Eliberto Ivory, MD for involving me in the care of  [place]. Please do not hesitate to contact me for further questions/concerns.     Written by Maryan Rued, as dictated by Vennie Homans, MD.      Jeannette How.Tomie Elko,  MD, [facility]    Patient Care Team:  Eliberto Ivory, MD as PCP - General (Internal Medicine)  Sheral Flow Grayling Congress, MD as Surgeon (General Surgery)  Kenton Kingfisher, MD (Endocrinology)    [facility]      [place], [place]     [place], [place]      [phone] / [phone] Fax

## 2018-06-03 NOTE — Patient Instructions (Signed)
Stop Crestor

## 2018-06-03 NOTE — Progress Notes (Signed)
Visit Vitals  BP 110/70 (BP 1 Location: Left arm, BP Patient Position: Sitting)   Pulse (!) 52   Resp 16   Ht 5' 4" (1.626 m)   Wt 155 lb (70.3 kg)   SpO2 96%   BMI 26.61 kg/m??

## 2018-06-08 LAB — ANCA PANEL
ANTIPROTEINASE 3 (PR-3) ABS, 163859: <3.5 U/mL (ref 0.0–3.5)
ATYPICAL PANCA: <1:20 {titer}
Atypical pANCA: <1:20 {titer}
Cytoplasmic (C-ANCA) Ab: <1:20 {titer}
Cytoplasmic (C-ANCA) Ab: <1:20 {titer}
Myeloperoxidase (MPO) Ab: <9 U/mL (ref 0.0–9.0)
Myeloperoxidase (MPO) Abs.: <9 U/mL (ref 0.0–9.0)
PERINUCLEAR (P-ANCA), 162401: <1:20 {titer}
Perinuclear (P-ANCA): <1:20 {titer}
Proteinase 3 (PR-3) Ab: <3.5 U/mL (ref 0.0–3.5)

## 2018-06-08 LAB — CK
Creatine Kinase,Total: 66 U/L (ref 24–173)
Total CK: 66 U/L (ref 24–173)

## 2018-06-08 LAB — C REACTIVE PROTEIN, QT: C-Reactive Protein, Qt: 1 mg/L (ref 0–10)

## 2018-06-08 LAB — SJOGREN'S ABS, SSA AND SSB
Sjogren's Anti-SS-A: <0.2 AI (ref 0.0–0.9)
Sjogren's Anti-SS-A: <0.2 AI (ref 0.0–0.9)
Sjogren's Anti-SS-B: <0.2 AI (ref 0.0–0.9)
Sjogren's Anti-SS-B: <0.2 AI (ref 0.0–0.9)

## 2018-06-08 LAB — SED RATE (ESR): Sed rate (ESR): 2 mm/hr (ref 0–40)

## 2018-06-08 LAB — PROTEIN ELECTROPHORESIS
A/G Ratio: 1.6 NA (ref 0.7–1.7)
A/G ratio: 1.6 (ref 0.7–1.7)
ALPHA-1-GLOBULIN: 0.2 g/dL (ref 0.0–0.4)
ALPHA-2 GLOBULIN: 0.7 g/dL (ref 0.4–1.0)
Albumin: 3.8 g/dL (ref 2.9–4.4)
Albumin: 3.8 g/dL (ref 2.9–4.4)
AlpHa-2 Globulin: 0.7 g/dL (ref 0.4–1.0)
Alpha-1-globulin: 0.2 g/dL (ref 0.0–0.4)
BETA GLOBULIN: 0.9 g/dL (ref 0.7–1.3)
Beta globulin: 0.9 g/dL (ref 0.7–1.3)
GAMMA GLOBULIN: 0.6 g/dL (ref 0.4–1.8)
GLOBULIN,TOTAL, 011052: 2.4 g/dL (ref 2.2–3.9)
Gamma globulin: 0.6 g/dL (ref 0.4–1.8)
Globulin, total: 2.4 g/dL (ref 2.2–3.9)
Protein, total: 6.2 g/dL (ref 6.0–8.5)
Total Protein: 6.2 g/dL (ref 6.0–8.5)

## 2018-06-08 LAB — RHEUMATOID FACTOR, QL: Rheumatoid factor: <10 IU/mL (ref 0.0–13.9)

## 2018-06-08 LAB — VITAMIN B12 & FOLATE
Folate: 8.6 ng/mL (ref >3.0)
Folate: 8.6 ng/mL (ref >3.0)
Vitamin B-12: 386 pg/mL (ref 232–1245)
Vitamin B12: 386 pg/mL (ref 232–1245)

## 2018-06-08 LAB — ANA, DIRECT, W/REFLEX
ANA, DIRECT: NEGATIVE
Antinuclear Antibodies Direct: NEGATIVE

## 2018-06-08 LAB — MUSK ANTIBODIES: MuSK Abs: <1 U/mL

## 2018-06-08 LAB — ACETYLCHOLINE RECEPTOR PANEL
AChR Binding Ab, serum: <0.03 nmol/L (ref 0.00–0.24)
AChR Blocking Ab: 11 % (ref 0–25)
AChR Modulating Ab: <12 % (ref 0–20)

## 2018-06-08 LAB — RHEUMATOID FACTOR, QUALITATIVE: Rheumatoid Factor: <10 IU/mL (ref 0.0–13.9)

## 2018-06-08 LAB — SEDIMENTATION RATE: Sed Rate: 2 mm/hr (ref 0–40)

## 2018-06-08 LAB — C-REACTIVE PROTEIN: CRP: 1 mg/L (ref 0–10)

## 2018-06-09 ENCOUNTER — Encounter: Primary: Internal Medicine

## 2018-06-15 ENCOUNTER — Encounter

## 2018-06-23 ENCOUNTER — Ambulatory Visit: Attending: Clinical Neuropsychologist | Primary: Internal Medicine

## 2018-06-23 ENCOUNTER — Ambulatory Visit: Primary: Internal Medicine

## 2018-06-23 ENCOUNTER — Ambulatory Visit: Admit: 2018-06-23 | Discharge: 2018-06-23 | Payer: MEDICARE | Primary: Internal Medicine

## 2018-06-23 ENCOUNTER — Ambulatory Visit
Admit: 2018-06-23 | Discharge: 2018-06-23 | Payer: MEDICARE | Attending: Clinical Neuropsychologist | Primary: Internal Medicine

## 2018-06-23 DIAGNOSIS — G629 Polyneuropathy, unspecified: Secondary | ICD-10-CM

## 2018-06-23 DIAGNOSIS — G3184 Mild cognitive impairment, so stated: Secondary | ICD-10-CM

## 2018-06-23 NOTE — Progress Notes (Signed)
[facility]  [facility]   [address]   [place], [place] [id number]   [phone] Office   [phone] Fax      Neuropsychology    Initial Diagnostic Interview Note      Referral:  Dannielle BurnMears, [name], MD, Dr. Velia MeyerHennessey    Leslie Potter is a 78 y.o. right handed divorced female who was accompanied to the initial clinical interview on 06/23/18 .  Please refer to her medical records for details pertaining to her history.  Briefly, the patient reported completed a PhD and denied history of repeating a grade of major learning issues.  She is a retired Engineer, civil (consulting)nurse.  Doctorate in Education.  She has a history of one year decline in memory.  She misplaces things . Loses words.  Starts tasks and does not complete.  Asks same questions over and over.  Sister and daughter notice and are concerned.  She does have an abnormal brain MRI (mild) and no background known stroke, TBI, meningitis/encephalitis, RMS Fever, Lupus, Lyme, etc.  She remains independent for medications, finances, day-to-day chores, etc.  Her mother had dementia. She does get dizzy at times.  No falls.  She was checked for orthostatic hypotension which was negative.  She remains independent for medications, finances, day-to-day chores, etc.  Just did EMG for balance issues.  Lab work negative so far, save for low B12 (low normal), and supplementing.  Sleep study was normal.  Appetite is okay.  Balance is terrible.  She is on Weight Watchers and is 27 pounds so far.  No changes in sense of taste or smell.  She has had depressive bouts in her life but not currently Anxiety about her health, and work was stressful at times.  Migraines have been gone since she stopped working.  No counseling or psychiatrist. She does get allergy shots.        She does repeat herself multiple times today.      EXAM:  MRI BRAIN W WO CONT  ??  INDICATION:    Unsteady gait, Unsteadiness on  feet  ??  COMPARISON:  None.  ??  CONTRAST: 14 ml Dotarem.  ??  TECHNIQUE:    Multiplanar multisequence acquisition without and with contrast of the brain.  ??  FINDINGS:  The ventricles are normal in size and position. Scattered white matter T2/FLAIR  hyperintensities, consistent with mild chronic microvascular ischemic disease.  There is no acute infarct, hemorrhage, extra-axial fluid collection, or mass  effect. There is no cerebellar tonsillar herniation. Expected arterial  flow-voids are present. No evidence of abnormal enhancement.  ??  The paranasal sinuses, mastoid air cells, and middle ears are clear. The orbital  contents are within normal limits with bilateral lens implants. No significant  osseous or scalp lesions are identified.  ??  IMPRESSION  IMPRESSION:   ??  1. No acute intracranial abnormality.  2. Mild chronic microvascular ischemic disease.  ??      Neuropsychological Mental Status Exam (NMSE):  Historian: Good  Praxis: No UE apraxia  R/L Orientation: Intact to self and to other  Dress: within normal limits   Weight: within normal limits   Appearance/Hygiene: within normal limits   Gait: within normal limits   Assistive Devices: Glasses  Mood: within normal limits   Affect: within normal limits   Comprehension: within normal limits   Thought Process: within normal limits   Expressive Language: within normal limits   Receptive  Language: within normal limits   Motor:  No cognitive or motor perseveration  ETOH: Denied  Tobacco: Denied  Illicit: Denied  SI/HI: Denied  Psychosis: Denied  Insight: Within normal limits  Judgment: Within normal limits  Other Psych:      Past Medical History:   Diagnosis Date   ??? Arthritis     hands/knees   ??? Asthma    ??? Asthma    ??? CAD (coronary artery disease)    ??? Chronic obstructive pulmonary disease ([facility])    ??? Diabetes ([facility])    ??? Dysthymia 05/08/2015   ??? Family history of heart attack 05/08/2015   ??? GERD (gastroesophageal reflux disease)    ??? Headache    ??? High cholesterol     ??? History of cholecystectomy 05/08/2015   ??? History of prediabetes 05/08/2015   ??? History of sinus bradycardia 05/08/2015   ??? Hx of diverticulitis of colon 05/08/2015   ??? Hyperlipidemia    ??? Hypothyroidism 05/08/2015   ??? Joint pain    ??? Memory disorder    ??? Pre-diabetes    ??? Pure hypercholesterolemia 05/08/2015   ??? Seasonal allergic reaction    ??? 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 Thyroid disease    ??? Thyroid disease        Past Surgical History:   Procedure Laterality Date   ??? COLONOSCOPY N/A 04/22/2017    COLONOSCOPY performed by Herbert Moors., MD at [facility]   ??? COLORECTAL SCRN; HI RISK IND  04/22/2017        ??? HX CATARACT REMOVAL Bilateral 2010   ??? HX CHOLECYSTECTOMY  2016   ??? HX OOPHORECTOMY Bilateral 2017   ??? HX OTHER SURGICAL  2012    colon polyp removed   ??? HX OTHER SURGICAL      ovarian cyst removed   ??? HX RETINAL DETACHMENT REPAIR Right 2008    tears   ??? HX TUBAL LIGATION     ??? UPPER GI ENDOSCOPY,BIOPSY  01/06/2015            Allergies   Allergen Reactions   ??? Simvastatin Myalgia       Family History   Problem Relation Age of Onset   ??? Heart Disease Mother    ??? Heart Disease Father    ??? Heart Disease Sister    ??? Colon Cancer Sister    ??? Heart Disease Brother        Social History     Tobacco Use   ??? Smoking status: Never Smoker   ??? Smokeless tobacco: Never Used   Substance Use Topics   ??? Alcohol use: No   ??? Drug use: No       Current Outpatient Medications   Medication Sig Dispense Refill   ??? rosuvastatin (CRESTOR) 5 mg tablet Take 1 Tab by mouth nightly. 90 Tab 2   ??? levothyroxine (SYNTHROID) 112 mcg tablet Take 112 mcg by mouth.     ??? cholecalciferol (VITAMIN D3) 2,000 unit cap capsule Vitamin D3 2,000 unit capsule   Take by oral route.     ??? mirabegron ER (MYRBETRIQ) 50 mg ER tablet Myrbetriq 50 mg tablet,extended release   Take 1 tablet every day by oral route for 31 days.     ??? diclofenac (VOLTAREN) 1 % gel Apply two grams by topical route four times daily to the affected area  (s).     ??? desmopressin (DDAVP) 0.1 mg tablet DDAVP 0.1 mg tablet  take one PO qhs     ??? cephALEXin (KEFLEX) 500 mg capsule TAKE 1 CAPSULE BY MOUTH EVERY 12 HOURS FOR 7 DAYS  0   ??? vitamin E (AQUA GEMS) 400 unit capsule vitamin E     ??? co-enzyme Q-10 (CO Q-10) 100 mg capsule Take 100 mg by mouth daily.     ??? magnesium 250 mg tab Take  by mouth.     ??? terbinafine HCl (LAMISIL) 250 mg tablet Take 250 mg by mouth daily.     ??? traZODone (DESYREL) 50 mg tablet Take  by mouth nightly.     ??? docusate sodium (COLACE) 100 mg capsule Take 100 mg by mouth as needed for Constipation.     ??? levothyroxine (SYNTHROID) 100 mcg tablet Take 112 mcg by mouth every other day.     ??? OTHER Allergy shots - weekly.     ??? furosemide (LASIX) 20 mg tablet Take 20 mg by mouth as needed.     ??? cholecalciferol (VITAMIN D3) 5,000 unit capsule Take 1 Cap by mouth every Tuesday Thursday, Saturday.     ??? VENTOLIN HFA 90 mcg/actuation inhaler INHALE TWO PUFFS BY MOUTH EVERY 4 HOURS AS NEEDED FOR  WHEEZING 1 Inhaler 12   ??? Omega-3 Fatty Acids (FISH OIL) 500 mg cap Take  by mouth as needed.     ??? MONTELUKAST SODIUM (SINGULAIR PO) Take  by mouth daily.     ??? fexofenadine (ALLEGRA) 180 mg tablet Take  by mouth daily.     ??? liothyronine (CYTOMEL) 5 mcg tablet Take 1 Tab by mouth daily. 90 Tab 4   ??? omeprazole (PRILOSEC) 40 mg capsule Take 1 Cap by mouth daily. Indications: GASTROESOPHAGEAL REFLUX 90 Cap 6   ??? ascorbic acid (VITAMIN C) 1,000 mg tablet Take 1,000 mg by mouth as needed.     ??? calcium-magnesium-zinc Tab Take 1,000 mg by mouth as needed.           Plan:  Obtain authorization for testing from insurance company.  Report to follow once testing, scoring, and interpretation completed.  ? Organic based neurocognitive issues versus mood disorder or combination of same.  ? Problems organic, functional, or both? This note will not be viewable in MyChart.

## 2018-06-23 NOTE — Progress Notes (Signed)
[facility]  [facility]  NEUROLOGY CLINIC   [address] [address]   [place], [place] [address]   [phone] Office   [phone] Fax      Neuropsychological Evaluation Report      Referral:  Leslie Burn, MD, Dr. Velia Potter is a 78 y.o. right handed divorced female who was accompanied to the initial clinical interview on 06/23/18 .  Please refer to her medical records for details pertaining to her history.  Briefly, the patient reported completed a PhD and denied history of repeating a grade of major learning issues.  She is a retired Engineer, civil (consulting).  Doctorate in Education.  She has a history of one year decline in memory.  She misplaces things . Loses words.  Starts tasks and does not complete.  Asks same questions over and over.  Sister and daughter notice and are concerned.  She does have an abnormal brain MRI (mild) and no background known stroke, TBI, meningitis/encephalitis, RMS Fever, Lupus, Lyme, etc.  She remains independent for medications, finances, day-to-day chores, etc.  Her mother had dementia. She does get dizzy at times.  No falls.  She was checked for orthostatic hypotension which was negative.  She remains independent for medications, finances, day-to-day chores, etc.  Just did EMG for balance issues.  Lab work negative so far, save for low B12 (low normal), and supplementing.  Sleep study was normal.  Appetite is okay.  Balance is terrible.  She is on Weight Watchers and is 27 pounds so far.  No changes in sense of taste or smell.  She has had depressive bouts in her life but not currently Anxiety about her health, and work was stressful at times.  Migraines have been gone since she stopped working.  No counseling or psychiatrist. She does get allergy shots.        She does repeat herself multiple times today.      EXAM:  MRI BRAIN W WO CONT  ??  INDICATION:    Unsteady gait, Unsteadiness on feet  ??  COMPARISON:   None.  ??  CONTRAST: 14 ml Dotarem.  ??  TECHNIQUE:    Multiplanar multisequence acquisition without and with contrast of the brain.  ??  FINDINGS:  The ventricles are normal in size and position. Scattered white matter T2/FLAIR  hyperintensities, consistent with mild chronic microvascular ischemic disease.  There is no acute infarct, hemorrhage, extra-axial fluid collection, or mass  effect. There is no cerebellar tonsillar herniation. Expected arterial  flow-voids are present. No evidence of abnormal enhancement.  ??  The paranasal sinuses, mastoid air cells, and middle ears are clear. The orbital  contents are within normal limits with bilateral lens implants. No significant  osseous or scalp lesions are identified.  ??  IMPRESSION  IMPRESSION:   ??  1. No acute intracranial abnormality.  2. Mild chronic microvascular ischemic disease.  ??      Neuropsychological Mental Status Exam (NMSE):  Historian: Good  Praxis: No UE apraxia  R/L Orientation: Intact to self and to other  Dress: within normal limits   Weight: within normal limits   Appearance/Hygiene: within normal limits   Gait: within normal limits   Assistive Devices: Glasses  Mood: within normal limits   Affect: within normal limits   Comprehension: within normal limits   Thought Process: within normal limits   Expressive Language: within normal limits   Receptive Language: within normal limits  Motor:  No cognitive or motor perseveration  ETOH: Denied  Tobacco: Denied  Illicit: Denied  SI/HI: Denied  Psychosis: Denied  Insight: Within normal limits  Judgment: Within normal limits  Other Psych:      Past Medical History:   Diagnosis Date   ??? Arthritis     hands/knees   ??? Asthma    ??? Asthma    ??? CAD (coronary artery disease)    ??? Chronic obstructive pulmonary disease (HCC)    ??? Diabetes (HCC)    ??? Dysthymia 05/08/2015   ??? Family history of heart attack 05/08/2015   ??? GERD (gastroesophageal reflux disease)    ??? Headache    ??? High cholesterol    ??? History of  cholecystectomy 05/08/2015   ??? History of prediabetes 05/08/2015   ??? History of sinus bradycardia 05/08/2015   ??? Hx of diverticulitis of colon 05/08/2015   ??? Hyperlipidemia    ??? Hypothyroidism 05/08/2015   ??? Joint pain    ??? Memory disorder    ??? Pre-diabetes    ??? Pure hypercholesterolemia 05/08/2015   ??? Seasonal allergic reaction    ??? 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 Thyroid disease    ??? Thyroid disease        Past Surgical History:   Procedure Laterality Date   ??? COLONOSCOPY N/A 04/22/2017    COLONOSCOPY performed by Herbert Moorsuckworth, Paul Frederick Jr., MD at [facility]   ??? COLORECTAL SCRN; HI RISK IND  04/22/2017        ??? HX CATARACT REMOVAL Bilateral 2010   ??? HX CHOLECYSTECTOMY  2016   ??? HX OOPHORECTOMY Bilateral 2017   ??? HX OTHER SURGICAL  2012    colon polyp removed   ??? HX OTHER SURGICAL      ovarian cyst removed   ??? HX RETINAL DETACHMENT REPAIR Right 2008    tears   ??? HX TUBAL LIGATION     ??? UPPER GI ENDOSCOPY,BIOPSY  01/06/2015            Allergies   Allergen Reactions   ??? Simvastatin Myalgia       Family History   Problem Relation Age of Onset   ??? Heart Disease Mother    ??? Heart Disease Father    ??? Heart Disease Sister    ??? Colon Cancer Sister    ??? Heart Disease Brother        Social History     Tobacco Use   ??? Smoking status: Never Smoker   ??? Smokeless tobacco: Never Used   Substance Use Topics   ??? Alcohol use: No   ??? Drug use: No       Current Outpatient Medications   Medication Sig Dispense Refill   ??? rosuvastatin (CRESTOR) 5 mg tablet Take 1 Tab by mouth nightly. 90 Tab 2   ??? levothyroxine (SYNTHROID) 112 mcg tablet Take 112 mcg by mouth.     ??? cholecalciferol (VITAMIN D3) 2,000 unit cap capsule Vitamin D3 2,000 unit capsule   Take by oral route.     ??? mirabegron ER (MYRBETRIQ) 50 mg ER tablet Myrbetriq 50 mg tablet,extended release   Take 1 tablet every day by oral route for 31 days.     ??? diclofenac (VOLTAREN) 1 % gel Apply two grams by topical route four times daily to the affected area (s).     ???  desmopressin (DDAVP) 0.1 mg tablet DDAVP 0.1 mg tablet   take one PO qhs     ???  cephALEXin (KEFLEX) 500 mg capsule TAKE 1 CAPSULE BY MOUTH EVERY 12 HOURS FOR 7 DAYS  0   ??? vitamin E (AQUA GEMS) 400 unit capsule vitamin E     ??? co-enzyme Q-10 (CO Q-10) 100 mg capsule Take 100 mg by mouth daily.     ??? magnesium 250 mg tab Take  by mouth.     ??? terbinafine HCl (LAMISIL) 250 mg tablet Take 250 mg by mouth daily.     ??? traZODone (DESYREL) 50 mg tablet Take  by mouth nightly.     ??? docusate sodium (COLACE) 100 mg capsule Take 100 mg by mouth as needed for Constipation.     ??? levothyroxine (SYNTHROID) 100 mcg tablet Take 112 mcg by mouth every other day.     ??? OTHER Allergy shots - weekly.     ??? furosemide (LASIX) 20 mg tablet Take 20 mg by mouth as needed.     ??? cholecalciferol (VITAMIN D3) 5,000 unit capsule Take 1 Cap by mouth every Tuesday Thursday, Saturday.     ??? VENTOLIN HFA 90 mcg/actuation inhaler INHALE TWO PUFFS BY MOUTH EVERY 4 HOURS AS NEEDED FOR  WHEEZING 1 Inhaler 12   ??? Omega-3 Fatty Acids (FISH OIL) 500 mg cap Take  by mouth as needed.     ??? MONTELUKAST SODIUM (SINGULAIR PO) Take  by mouth daily.     ??? fexofenadine (ALLEGRA) 180 mg tablet Take  by mouth daily.     ??? liothyronine (CYTOMEL) 5 mcg tablet Take 1 Tab by mouth daily. 90 Tab 4   ??? omeprazole (PRILOSEC) 40 mg capsule Take 1 Cap by mouth daily. Indications: GASTROESOPHAGEAL REFLUX 90 Cap 6   ??? ascorbic acid (VITAMIN C) 1,000 mg tablet Take 1,000 mg by mouth as needed.     ??? calcium-magnesium-zinc Tab Take 1,000 mg by mouth as needed.           Plan:  Obtain authorization for testing from insurance company.  Report to follow once testing, scoring, and interpretation completed.  ? Organic based neurocognitive issues versus mood disorder or combination of same.  ? Problems organic, functional, or both? This note will not be viewable in MyChart.        Neuropsychological Test Results  Patient Testing 06/23/18 Report Completed 06/24/18  A Psychometrist  Assisted w/ portions of this evaluation while under my direct  supervision    The following evaluation procedures/tests were administered:      Neuropsychologist Performed, Interpreted, & Reported:  Neuropsychological Mental Status Exam, Revised Memory & Behavior Checklist,  Mini Mental Status Exam, Clock Drawing Test, McGill-Melzack Pain Questionnaire, Test Of Premorbid Functioning, History Taking  & Clinical Interview With The Patient, WCST, PAI, CPT-III, Review Of Available Records.    Psychometrist Administered under Neuropsychologist Supervision & Neuropsychologist Interpreted & Neuropsychologist Reported:  Verbal Fluency Tests, [employer] Test ??? Revised, Trailmaking Test Parts A & B, Wechsler Adult Intelligence Scale - IV, [place] Verbal Learning Test ??? 3, Grooved Pegboard, Beck Depression Inventory ??? II, Beck Anxiety Inventory.   Test Findings:  Test Findings:  Note:  The patient???s raw data have been compared with currently available norms which include demographic corrections for age, gender, and/or education.  Sometimes, the patient???s scores are compared to demographically similar individuals as close to the patient???s age, education level, etc., as possible.  "Average" is viewed as being +/- 1 standard deviation (SD) from the stated mean for a particular test score.  "Low average" is viewed as being  between 1 and 2 SD below the mean, and above average is viewed as being 1 and 2 SD above the mean.  Scores falling in the ???borderline??? range (between 1-1/2 and 2 SD below the mean) are viewed with particular attention as to whether they are normal or abnormal neurocognitive test scores.  Other methods of inference in analyzing the test data are also utilized, including the pattern and range of scores in the profile, bilateral motor functions, and the presence, if any, of pathognomonic signs.        Behaviorally, the patient was friendly and cooperative and appeared motivated to perform well during this  examination.   Within this context, the results of this evaluation are viewed as a valid reflection of the patient???s actual neurocognitive and emotional status.       The patient's score of 30/30 on the Mini-Mental Status Exam was normal.  Clock drawing was normal.        Her structured word list fluency, as assessed by the FAS Test, was within the below average range with a T score of 41.  Category fluency was within the above average range with a T score of 57.  Confrontation naming ability, as assessed by the [facility] Test ??? Revised, was within the average range at 55/60 correct (T = 45).  This pattern of performance is not indicative of a patient who is at increased risk for day-to-day problems with verbal fluency or confrontation naming.       The patient was administered the [place]??? Continuous Performance Test ??? III and review of the subscales within this instrument revealed mild concern for inattentiveness without impulsivity.  This pattern of performance is indicative of a patient who is at increased risk for day-to-day problems with sustained visual attention/concentration.     The patient is not showing problems with working memory capacity (23rd %ile) and processing speed (98th %ile) was very superior on the WAIS-IV.  Her Verbal Comprehension Index score of 114 was high average  Her Perceptual Reasoning Index score of 90 was average.  No FSIQ is reported due to domain scatter, as her scores vary from the low average to very superior range. Working memory is lower than expected  based on an assessment of premorbid functioning.   This may signal functional interference.      The patient was administered the [place] Verbal Learning Test  - 3 and generated a normal to superior range learning curve over five repeated auditory word list learning trials.  An interference trial was normal.  Free and cued, short and long delayed recall were well above normal range (superior).   Recognition and forced  choice recall were normal, the latter suggesting good effort.   This pattern of performance is not indicative of a patient who is at increased risk for day-to-day problems with auditory learning and/or memory.       Simple timed visual motor sequencing (Trailmaking Test Part A) was within the above average range with a T score of 57.  Her performance on a similar, but more complex task of timed visual motor sequencing (Trailmaking Test Part B) was within the below average range with a T score of 41 (only one sequencing error).  On additional assessment of executive functioning [facility]), the patient was able to complete 3/6 categories on this test (>16th %ile).    This pattern of performance is not indicative of a patient who is at increased risk for day-to-day problems with executive  functioning.     Fine motor dexterity was within the normal range bilaterally.  This does not raise concern for a particularly lateralized brain dysfunction.       The patient rated her current level of pain as "2/5 - Discomforting" on the McGill-Melzack Pain Questionnaire. She reported pain in her back, knees, lower legs, and feet.      Her Beck Depression Inventory ???II score of 6 was within the minimally depressed range.  Her Beck Anxiety Inventory score of 8 reflected mild anxiety.  The patient's responses on the Personality Assessment Inventory were deemed valid for interpretation, though a very high level of defensiveness in responding is noted.  She is reluctant to acknowledge minor faults present in most people, and tends to present herself quite favorably.  Despite this, there are physical signs of depression and anxiety, moodiness, tension, and apprehension, as well as some anger control concerns.  She is self-assured, confident, and dominant.  Her level of self-reported treatment motivation is low.     Impressions & Recommendations:  This is a predominantly normal to superior range Neuropsychological  Evaluation with respect to neurocognitive functioning.  In this regard, the only impairment noted relates to sustained attention. Working memory is low in a manner often seen in cases of functional interference.  Mental status, verbal fluency, confrontation naming, auditory learning, auditory memory, processing speed, perceptual reasoning, verbal comprehension, bilateral fine motor dexterity, and executive functioning abilities remain well within or above the normal range.  IQ is very superior.   Emotionally, there is concern for mild anxiety.  She has had bouts of depression in her life.       Thankfully, this profile is not consistent with MCI or dementia.  Instead, this is a chronic ADHD Inattentive issue previously masked by very superior range IQ compounded by anxiety. A mild depression has not been ruled out.  The exam is quite reassuring. The decision to treat her attention deficit is her own to make with the recommendations of her medical care providers.  Some individuals decide to try and treat with non stimulant medications, and others find the exam reassuring and educational and do not wish any further intervention.  Consider psychotherapy on a prn basis.  Stay active mentally, physically, and socially.  I am not concerned about competency, driving, day-to-day supervision, etc. Baseline now established.  Follow up prn.  Clinical correlation is, of course, indicated.        I will discuss these findings with the patient when she follows up with me in the near future.  A follow up Neuropsychological Evaluation is indicated on a prn basis, especially if there are any cognitive and/or emotional changes.      DIAGNOSES:  The Referral Diagnosis of MCI - IS NOT SUPPORTED     Mild Anxiety     History of Episodic Depression     ADHD, Inattentive, Mild, Chronic (previously masked by very superior range IQ now compounded by age, mood, and physical factors)     The above information is based upon information  currently available to me.  If there is any additional information of which I am currently unaware, I would be more than happy to review it upon having it made available to me.  Thank you for the opportunity to see this interesting individual.     Sincerely,       Leslie Depaula A. Wynonia HazardKhawaja, PsyD, EdS    CC:  Leslie BurnMears, Leslie H, MD , Dr. Peri MarisHennessey  Time Documentation:  [address]*6 [id number]*9    81191 x 1  47829 x 5 Test Administration/Data Gathering By Technician: (3 hours). 56213 x 1 (first 30 minutes), 96138 x 5 (each additional 30 minutes)    96132 x 1  96133 x 1 Testing Evaluation Services by Neuropsychologist (1 hour, 50 minutes) 96132 x 1 (first hour), 96133 x 1 (50 minutes)    Definitions:      96116/96121:  Neurobehavioral Status Exam, Clinical interview.  Clinical assessment of thinking, reasoning and judgment, by neuropsychologist, both face to face time with patient and time interpreting those test results and reporting, first and subsequent hours)    96138/96139: Neuropsychological Test Administration by Technician/Psychometrist, first 30 minutes and each additional 30 minutes.     The above includes: Record review.  Review of history provided by patient.  Review of collaborative information.  Testing by Clinician.  Review of raw data. Scoring. Report writing of individual tests administered by Clinician.  Integration of individual tests administered by psychometrist with NSE/testing by clinician, review of records/history/collaborative information, case Conceptualization, treatment planning, clinical decision making, report writing, coordination Of Care. Psychometry test codes as time spent by psychometrist administering and scoring neurocognitive/psychological tests under supervision of neuropsychologist.  Integral services including scoring of raw data, data interpretation, case conceptualization, report writing etcetera were initiated after the patient finished testing/raw data collected and was completed on the date  the report was signed.

## 2018-06-23 NOTE — Progress Notes (Signed)
 Progress  Notes by Trudy Kubas at 06/23/18 1000                Author: Trudy Kubas  Service: --  Author Type: Technician       Filed: 06/23/18 1158  Encounter Date: 06/23/2018  Status: Signed          Editor: Trudy Kubas (Technician)                     EMG/ NCS Report   [facility]   [address], [address]   [place], [place]  [address]    Ph: [phone]    FAX: [phone]/ [phone]   Test Date:  06/23/2018             Patient:  Leslie Potter  DOB:  30-Nov-1940  Physician:  Norleen Crawford, [place], MD            Sex:  Female  Height:  '   Ref Phys:  Norleen Crawford, IV, MD            ID#:  [id number]  Weight:   lbs.  Technician:  Kubas Lu        Patient History / Exam:   [id number] ISSUES.               EMG & NCV Findings:   Evaluation of the left Fibular motor nerve showed prolonged distal onset latency (14.5 ms), reduced amplitude (0.2 mV), normal conduction velocity (B Fib-Ankle, 64 m/s), and decreased conduction velocity (Poplt-B Fib, 18 m/s).  The right Fibular motor  nerve showed normal distal onset latency (4.6 ms), reduced amplitude (0.2 mV), normal conduction velocity (B Fib-Ankle, 41 m/s), and normal conduction velocity (Poplt-B Fib, 71 m/s).  The right Fibular TA motor nerve showed normal distal onset latency  (2.9 ms), normal amplitude (6.0 mV), and normal conduction velocity (Poplit-Fib Head, 67 m/s).  The left tibial motor and the right tibial motor nerves showed normal distal onset latency (L5.9, R3.8 ms), normal amplitude (L7.3, R11.1 mV), and normal conduction  velocity (Knee-Ankle, L64, R42 m/s).  The left Sup Fibular sensory nerve showed normal distal peak latency (3.7 ms), normal amplitude (4.5 V), and decreased conduction velocity (Lower leg-Lat ankle, 30 m/s).  The right Sup Fibular sensory nerve showed  normal distal peak latency (2.8 ms), reduced amplitude (3.6 V), and normal conduction velocity (Lower leg-Lat ankle, 48  m/s).  The left sural sensory and the right sural sensory nerves showed normal distal peak latency (L4.1, R3.8 ms) and normal amplitude  (L5.8, R6.3 V).        All F Wave latencies were within normal limits.  Left vs. Right comparison data for the tibial F wave indicates abnormal L-R latency difference (7.97 ms).  All H Reflex left vs. right side latency differences were within normal limits.        All examined muscles (as indicated in the following table) showed no evidence of electrical instability.           Impression:            ___________________________   Norleen Crawford, IV, MD         Nerve Conduction Studies   Anti Sensory Summary Table                 Stim Site  NR  Peak (ms)  Norm Peak (ms)  P-T Amp (V)  Norm P-T Amp  Site1  Site2  Dist (cm)       Left Sup Fibular Anti Sensory (Lat ankle)  29.9C               Lower leg      3.7  <4.6  4.5  >4  Lower leg  Lat ankle  10.0     Site 2      3.8    7.4                             4.1    4.6               Right Sup Fibular Anti Sensory (Lat ankle)  30.3C               Lower leg      2.8  <4.6  3.6  >4  Lower leg  Lat ankle  10.0               Site 2      2.8    6.0                       Site 3      2.6    5.1               Left Sural Anti Sensory (Lat Mall)  29.6C               Calf      4.1  <4.5  5.8  >4.0  Calf  Lat Mall  14.0     Site 2      4.1    9.9                             4.0    3.2               Right Sural Anti Sensory (Lat Mall)  30.9C               Calf      3.8  <4.5  6.3  >4.0  Calf  Lat Mall  14.0               Site 2      3.8    7.1                Motor Summary Table                    Stim Site  NR  Onset (ms)  Norm Onset (ms)  O-P Amp (mV)  Norm O-P Amp  Amp (Prev) (%)  Site1  Site2  Dist (cm)  Vel (m/s)  Norm Vel (m/s)       Left Fibular Motor (Ext Dig Brev)  29.8C                  Ankle      14.5  <6.5  0.2  >1.1  100.0  Ankle  Ext Dig Brev  8.0                      B Fib      9.8    0.3    150.0  B Fib  Ankle  30.0  64  >38  Poplt      15.4    0.3    100.0  Poplt  B Fib  10.0  18  >42                        25.7    0.2                     Right Fibular Motor (Ext Dig Brev)  30.6C                  Ankle      4.6  <6.5  0.2  >1.1  100.0  Ankle  Ext Dig Brev  8.0                      B Fib      12.0    0.3    150.0  B Fib  Ankle  30.0  41  >38                  Poplt      13.4    0.3    100.0  Poplt  B Fib  10.0  71  >42       Right Fibular TA Motor (Tib Ant)  29.8C                  Fib Head      2.9  <4.5  6.0  >3.0  100.0  Fib Head  Tib Ant  10.0                      Poplit      4.4    6.2    103.3  Poplit  Fib Head  10.0  67  >40       Left Tibial Motor (Abd Hall Brev)  29.8C                  Ankle      5.9  <6.1  7.3  >1.1  100.0  Ankle  Abd Hall Brev  8.0                      Knee      11.4    5.6    76.7  Knee  Ankle  35.0  64  >39       Right Tibial Motor (Abd Hall Brev)  30.4C                  Ankle      3.8  <6.1  11.1  >1.1  100.0  Ankle  Abd Hall Brev  8.0                      Knee      12.2    6.9    62.2  Knee  Ankle  35.0  42  >39        F Wave Studies             NR  F-Lat (ms)  Lat Norm (ms)  L-R F-Lat (ms)  L-R Lat Norm       Left Tibial (Mrkrs) (Abd Hallucis)  29.8C               41.99  <56  7.97  <5.7       Right Tibial (Mrkrs) (Abd Hallucis)  30.4C  49.96  <56  7.97  <5.7        H Reflex Studies            NR  H-Lat (ms)  L-R H-Lat (ms)  L-R Lat Norm       Left Tibial (Gastroc)  29.8C              34.58  0.00  <2.0       Right Tibial (Gastroc)  30.5C              34.58  0.00  <2.0        EMG                    Side  Muscle  Nerve  Root  Ins Act  Fibs  Psw  Recrt  Duration  Amp  Poly  Comment                  Right  Ext Dig Brev  Dp Br Peron  L5, S1  Nml  Nml  Nml  Nml  Nml  Nml  Nml       Right  AntTibialis  Dp Br Peron  L4-5  Nml  Nml  Nml  Nml  Nml  Nml  Nml       Right  MedGastroc  Tibial  S1-2  Nml  Nml  Nml  Nml  Nml  Nml  Nml       Right  VastusMed  Femoral  L2-4  Nml  Nml  Nml  Nml   Nml  Nml  Nml       Right  BicepsFemL  Sciatic  L5-S2  Nml  Nml  Nml  Nml  Nml  Nml  Nml       Left  Ext Dig Brev  Dp Br Peron  L5, S1  Nml  Nml  Nml  Nml  Nml  Nml  Nml       Left  AntTibialis  Dp Br Peron  L4-5  Nml  Nml  Nml  Nml  Nml  Nml  Nml       Left  MedGastroc  Tibial  S1-2  Nml  Nml  Nml  Nml  Nml  Nml  Nml       Left  VastusMed  Femoral  L2-4  Nml  Nml  Nml  Nml  Nml  Nml  Nml                    Left  BicepsFemL  Sciatic  L5-S2  Nml  Nml  Nml  Nml  Nml  Nml  Nml                        Nerve Conduction Studies   Anti Sensory Left/Right Comparison                    Stim Site  L Lat (ms)  R Lat (ms)  L-R Lat (ms)  L Amp (V)  R Amp (V)  L-R Amp (%)  Site1  Site2  L Vel (m/s)  R Vel (m/s)  L-R Vel (m/s)       Sup Fibular Anti Sensory (Lat ankle)  29.9C                  Lower leg  3.3  2.1  1.2  4.5  3.6  20.0  Lower leg  Lat ankle  30  48  18                  Site 2  3.4      7.4                                  3.5      4.6                     Sural Anti Sensory (Lat Mall)  29.6C                  Calf  3.3  3.1  0.2  5.8  6.3  7.9  Calf  Lat Mall  42  45  3     Site 2  2.9  3.2  0.3  9.9  7.1  28.3                              2.9      3.2                      Motor Left/Right Comparison                    Stim Site  L Lat (ms)  R Lat (ms)  L-R Lat (ms)  L Amp (mV)  R Amp (mV)  L-R Amp (%)  Site1  Site2  L Vel (m/s)  R Vel (m/s)  L-R Vel (m/s)       Fibular Motor (Ext Dig Brev)  29.8C                  Ankle  14.5  4.6  9.9  0.2  0.2  0.0  Ankle  Ext Dig Brev                        B Fib  9.8  12.0  2.2  0.3  0.3  0.0  B Fib  Ankle  64  41  23                  Poplt  15.4  13.4  2.0  0.3  0.3  0.0  Poplt  B Fib  18  71  53                    25.7      0.2                     Fibular TA Motor (Tib Ant)  29.8C                  Fib Head    2.9      6.0    Fib Head  Tib Ant                        Poplit    4.4      6.2    Poplit  Fib Head    67         Tibial Motor (Abd Hall Brev)  29.8C                   Ankle  5.9  3.8  2.1  7.3  11.1  34.2  Ankle  Abd Hall Brev                        Knee  11.4  12.2  0.8  5.6  6.9  18.8  Knee  Ankle  64  42  22             Waveforms:

## 2018-06-23 NOTE — Progress Notes (Signed)
This was an elective EMG and nerve conduction of patient's lower extremities.  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    Patient history.  Patient has established gait and balance issues and seems to be a collection of positional related dizziness at times as well.  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  Patient had recent blood work which was normal and looked at varied etiology such as deficiency states neuromuscular issues and myopathy.  There were concerns to episodic memory performance and she will be getting neuropsychological testing this afternoon.    Patient exam.  Alert cooperative articulate and appropriate.  Cranial nerves II through XII normal.  Cerebellar testing finger-nose-finger toe finger intact.  Motor 5/5.  Sensory showing slight shading distally of sensibility in the lower extremities.  Reflexes were 1-2+ and Romberg revealed an element of truncal sway.    EMG and nerve conduction findings.    1.  Needle insertion and probing was uniformly normal.  Patient had no difficulty with tolerability on this part.  No evidence of acute denervation or chronic denervation/reinnervation or myopathic potentials.  Motor unit recruitment as to number morphology and time sequencing was appropriate.    2.  The nerve conduction portion revealed normal sensory results for both legs.  There was a severe delay in the left peroneal motor latency at the ankle.  There was uniformly diminished peroneal motor amplitudes right and left legs.  There were right and left peroneal nerve conduction velocity discrepancies at the fibular heads.  There was a right tibial F wave delay compared with left.  Tibial H reflexes were normal.    Impression: This study shows impressive compromise of right and left peroneal motor amplitudes to the EDB. Both EDB's by palpation were diminutive in size overall. Whether this  represents concise and particular pathology at the fibular head is a consideration.  Additional concerns with the left peroneal motor extend to the ankle segment and distal neuropathy.  Clinical correlation is advised.

## 2018-06-23 NOTE — Progress Notes (Signed)
EMG/ NCS Report  [facility] ??? [facility]  [address] [place], [address]  [place], [place]  [address]   Ph: [phone]   FAX: [phone]/ [phone]  Test Date:  06/23/2018    Patient: Leslie Potter DOB: 1940/11/20 Physician: Sim Boast, Cherrie Gauze, MD   Sex: Female Height: ' " Ref Phys: Sim Boast, IV, MD   ID#: [id number] Weight:  lbs. Technician: Danley Danker     Patient History / Exam:  [id number] ISSUES.          EMG & NCV Findings:  Evaluation of the left Fibular motor nerve showed prolonged distal onset latency (14.5 ms), reduced amplitude (0.2 mV), normal conduction velocity (B Fib-Ankle, 64 m/s), and decreased conduction velocity (Poplt-B Fib, 18 m/s).  The right Fibular motor nerve showed normal distal onset latency (4.6 ms), reduced amplitude (0.2 mV), normal conduction velocity (B Fib-Ankle, 41 m/s), and normal conduction velocity (Poplt-B Fib, 71 m/s).  The right Fibular TA motor nerve showed normal distal onset latency (2.9 ms), normal amplitude (6.0 mV), and normal conduction velocity (Poplit-Fib Head, 67 m/s).  The left tibial motor and the right tibial motor nerves showed normal distal onset latency (L5.9, R3.8 ms), normal amplitude (L7.3, R11.1 mV), and normal conduction velocity (Knee-Ankle, L64, R42 m/s).  The left Sup Fibular sensory nerve showed normal distal peak latency (3.7 ms), normal amplitude (4.5 ??V), and decreased conduction velocity (Lower leg-Lat ankle, 30 m/s).  The right Sup Fibular sensory nerve showed normal distal peak latency (2.8 ms), reduced amplitude (3.6 ??V), and normal conduction velocity (Lower leg-Lat ankle, 48 m/s).  The left sural sensory and the right sural sensory nerves showed normal distal peak latency (L4.1, R3.8 ms) and normal amplitude (L5.8, R6.3 ??V).      All F Wave latencies were within normal limits.  Left vs. Right comparison data for the tibial F wave indicates abnormal L-R latency difference (7.97  ms).  All H Reflex left vs. right side latency differences were within normal limits.      All examined muscles (as indicated in the following table) showed no evidence of electrical instability.        Impression:        ___________________________  Sim Boast, IV, MD      Nerve Conduction Studies  Anti Sensory Summary Table     Stim Site NR Peak (ms) Norm Peak (ms) P-T Amp (??V) Norm P-T Amp Site1 Site2 Dist (cm)   Left Sup Fibular Anti Sensory (Lat ankle)  29.9??C   Lower leg    3.7 <4.6 4.5 >4 Lower leg Lat ankle 10.0   Site 2    3.8  7.4           4.1  4.6       Right Sup Fibular Anti Sensory (Lat ankle)  30.3??C   Lower leg    2.8 <4.6 3.6 >4 Lower leg Lat ankle 10.0   Site 2    2.8  6.0       Site 3    2.6  5.1       Left Sural Anti Sensory (Lat Mall)  29.6??C   Calf    4.1 <4.5 5.8 >4.0 Calf Lat Mall 14.0   Site 2    4.1  9.9           4.0  3.2       Right Sural Anti Sensory (Lat Mall)  30.9??C   Calf    3.8 <  4.5 6.3 >4.0 Calf Lat Mall 14.0   Site 2    3.8  7.1         Motor Summary Table     Stim Site NR Onset (ms) Norm Onset (ms) O-P Amp (mV) Norm O-P Amp Amp (Prev) (%) Site1 Site2 Dist (cm) Vel (m/s) Norm Vel (m/s)   Left Fibular Motor (Ext Dig Brev)  29.8??C   Ankle    14.5 <6.5 0.2 >1.1 100.0 Ankle Ext Dig Brev 8.0     B Fib    9.8  0.3  150.0 B Fib Ankle 30.0 64 >38   Poplt    15.4  0.3  100.0 Poplt B Fib 10.0 18 >42       25.7  0.2          Right Fibular Motor (Ext Dig Brev)  30.6??C   Ankle    4.6 <6.5 0.2 >1.1 100.0 Ankle Ext Dig Brev 8.0     B Fib    12.0  0.3  150.0 B Fib Ankle 30.0 41 >38   Poplt    13.4  0.3  100.0 Poplt B Fib 10.0 71 >42   Right Fibular TA Motor (Tib Ant)  29.8??C   Fib Head    2.9 <4.5 6.0 >3.0 100.0 Fib Head Tib Ant 10.0     Poplit    4.4  6.2  103.3 Poplit Fib Head 10.0 67 >40   Left Tibial Motor (Abd Hall Brev)  29.8??C   Ankle    5.9 <6.1 7.3 >1.1 100.0 Ankle Abd Hall Brev 8.0     Knee    11.4  5.6  76.7 Knee Ankle 35.0 64 >39   Right Tibial Motor (Abd Hall Brev)  30.4??C    Ankle    3.8 <6.1 11.1 >1.1 100.0 Ankle Abd Hall Brev 8.0     Knee    12.2  6.9  62.2 Knee Ankle 35.0 42 >39     F Wave Studies     NR F-Lat (ms) Lat Norm (ms) L-R F-Lat (ms) L-R Lat Norm   Left Tibial (Mrkrs) (Abd Hallucis)  29.8??C      41.99 <56 7.97 <5.7   Right Tibial (Mrkrs) (Abd Hallucis)  30.4??C      49.96 <56 7.97 <5.7     H Reflex Studies     NR H-Lat (ms) L-R H-Lat (ms) L-R Lat Norm   Left Tibial (Gastroc)  29.8??C      34.58 0.00 <2.0   Right Tibial (Gastroc)  30.5??C      34.58 0.00 <2.0     EMG     Side Muscle Nerve Root Ins Act Fibs Psw Recrt Duration Amp Poly Comment   Right Ext Dig Brev Dp Br Peron L5, S1 Nml Nml Nml Nml Nml Nml Nml    Right AntTibialis Dp Br Peron L4-5 Nml Nml Nml Nml Nml Nml Nml    Right MedGastroc Tibial S1-2 Nml Nml Nml Nml Nml Nml Nml    Right VastusMed Femoral L2-4 Nml Nml Nml Nml Nml Nml Nml    Right BicepsFemL Sciatic L5-S2 Nml Nml Nml Nml Nml Nml Nml    Left Ext Dig Brev Dp Br Peron L5, S1 Nml Nml Nml Nml Nml Nml Nml    Left AntTibialis Dp Br Peron L4-5 Nml Nml Nml Nml Nml Nml Nml    Left MedGastroc Tibial S1-2 Nml Nml Nml Nml Nml Nml Nml    Left VastusMed Femoral L2-4 Nml Nml  Nml Nml Nml Nml Nml    Left BicepsFemL Sciatic L5-S2 Nml Nml Nml Nml Nml Nml Nml                Nerve Conduction Studies  Anti Sensory Left/Right Comparison     Stim Site L Lat (ms) R Lat (ms) L-R Lat (ms) L Amp (??V) R Amp (??V) L-R Amp (%) Site1 Site2 L Vel (m/s) R Vel (m/s) L-R Vel (m/s)   Sup Fibular Anti Sensory (Lat ankle)  29.9??C   Lower leg 3.3 2.1 1.2 4.5 3.6 20.0 Lower leg Lat ankle 30 48 18   Site 2 3.4   7.4           3.5   4.6          Sural Anti Sensory (Lat Mall)  29.6??C   Calf 3.3 3.1 0.2 5.8 6.3 7.9 Calf Lat Mall 42 45 3   Site 2 2.9 3.2 0.3 9.9 7.1 28.3         2.9   3.2            Motor Left/Right Comparison     Stim Site L Lat (ms) R Lat (ms) L-R Lat (ms) L Amp (mV) R Amp (mV) L-R Amp (%) Site1 Site2 L Vel (m/s) R Vel (m/s) L-R Vel (m/s)   Fibular Motor (Ext Dig Brev)  29.8??C    Ankle 14.5 4.6 9.9 0.2 0.2 0.0 Ankle Ext Dig Brev      B Fib 9.8 12.0 2.2 0.3 0.3 0.0 B Fib Ankle 64 41 23   Poplt 15.4 13.4 2.0 0.3 0.3 0.0 Poplt B Fib 18 71 53    25.7   0.2          Fibular TA Motor (Tib Ant)  29.8??C   Fib Head  2.9   6.0  Fib Head Tib Ant      Poplit  4.4   6.2  Poplit Fib Head  67    Tibial Motor (Abd Hall Brev)  29.8??C   Ankle 5.9 3.8 2.1 7.3 11.1 34.2 Ankle Abd Hall Brev      Knee 11.4 12.2 0.8 5.6 6.9 18.8 Knee Ankle 64 42 22         Waveforms:

## 2018-06-23 NOTE — Progress Notes (Signed)
[facility]  [facility]  NEUROLOGY CLINIC   [address] [place] [address]   [place], [place] [address]   [phone] Office   [phone] Fax      Neuropsychology    Initial Diagnostic Interview Note      Referral:  [name], MD, Dr. Velia Meyer is a 78 y.o. right handed divorced female who was accompanied to the initial clinical interview on 06/23/18 .  Please refer to her medical records for details pertaining to her history.  Briefly, the patient reported completed a PhD and denied history of repeating a grade of major learning issues.  She is a retired Engineer, civil (consulting).  Doctorate in Education.  She has a history of one year decline in memory.  She misplaces things . Loses words.  Starts tasks and does not complete.  Asks same questions over and over.  Sister and daughter notice and are concerned.  She does have an abnormal brain MRI (mild) and no background known stroke, TBI, meningitis/encephalitis, RMS Fever, Lupus, Lyme, etc.  She remains independent for medications, finances, day-to-day chores, etc.  Her mother had dementia. She does get dizzy at times.  No falls.  She was checked for orthostatic hypotension which was negative.  She remains independent for medications, finances, day-to-day chores, etc.  Just did EMG for balance issues.  Lab work negative so far, save for low B12 (low normal), and supplementing.  Sleep study was normal.  Appetite is okay.  Balance is terrible.  She is on Weight Watchers and is 27 pounds so far.  No changes in sense of taste or smell.  She has had depressive bouts in her life but not currently Anxiety about her health, and work was stressful at times.  Migraines have been gone since she stopped working.  No counseling or psychiatrist. She does get allergy shots.        She does repeat herself multiple times today.      EXAM:  MRI BRAIN W WO CONT  ??  INDICATION:    Unsteady gait, Unsteadiness on feet  ??   COMPARISON:  None.  ??  CONTRAST: 14 ml Dotarem.  ??  TECHNIQUE:    Multiplanar multisequence acquisition without and with contrast of the brain.  ??  FINDINGS:  The ventricles are normal in size and position. Scattered white matter T2/FLAIR  hyperintensities, consistent with mild chronic microvascular ischemic disease.  There is no acute infarct, hemorrhage, extra-axial fluid collection, or mass  effect. There is no cerebellar tonsillar herniation. Expected arterial  flow-voids are present. No evidence of abnormal enhancement.  ??  The paranasal sinuses, mastoid air cells, and middle ears are clear. The orbital  contents are within normal limits with bilateral lens implants. No significant  osseous or scalp lesions are identified.  ??  IMPRESSION  IMPRESSION:   ??  1. No acute intracranial abnormality.  2. Mild chronic microvascular ischemic disease.  ??      Neuropsychological Mental Status Exam (NMSE):  Historian: Good  Praxis: No UE apraxia  R/L Orientation: Intact to self and to other  Dress: within normal limits   Weight: within normal limits   Appearance/Hygiene: within normal limits   Gait: within normal limits   Assistive Devices: Glasses  Mood: within normal limits   Affect: within normal limits   Comprehension: within normal limits   Thought Process: within normal limits   Expressive Language: within normal limits   Receptive  Language: within normal limits   Motor:  No cognitive or motor perseveration  ETOH: Denied  Tobacco: Denied  Illicit: Denied  SI/HI: Denied  Psychosis: Denied  Insight: Within normal limits  Judgment: Within normal limits  Other Psych:      Past Medical History:   Diagnosis Date   ??? Arthritis     hands/knees   ??? Asthma    ??? Asthma    ??? CAD (coronary artery disease)    ??? Chronic obstructive pulmonary disease (HCC)    ??? Diabetes (HCC)    ??? Dysthymia 05/08/2015   ??? Family history of heart attack 05/08/2015   ??? GERD (gastroesophageal reflux disease)    ??? Headache    ??? High cholesterol     ??? History of cholecystectomy 05/08/2015   ??? History of prediabetes 05/08/2015   ??? History of sinus bradycardia 05/08/2015   ??? Hx of diverticulitis of colon 05/08/2015   ??? Hyperlipidemia    ??? Hypothyroidism 05/08/2015   ??? Joint pain    ??? Memory disorder    ??? Pre-diabetes    ??? Pure hypercholesterolemia 05/08/2015   ??? Seasonal allergic reaction    ??? 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 Thyroid disease    ??? Thyroid disease        Past Surgical History:   Procedure Laterality Date   ??? COLONOSCOPY N/A 04/22/2017    COLONOSCOPY performed by Herbert Moors., MD at [facility]   ??? COLORECTAL SCRN; HI RISK IND  04/22/2017        ??? HX CATARACT REMOVAL Bilateral 2010   ??? HX CHOLECYSTECTOMY  2016   ??? HX OOPHORECTOMY Bilateral 2017   ??? HX OTHER SURGICAL  2012    colon polyp removed   ??? HX OTHER SURGICAL      ovarian cyst removed   ??? HX RETINAL DETACHMENT REPAIR Right 2008    tears   ??? HX TUBAL LIGATION     ??? UPPER GI ENDOSCOPY,BIOPSY  01/06/2015            Allergies   Allergen Reactions   ??? Simvastatin Myalgia       Family History   Problem Relation Age of Onset   ??? Heart Disease Mother    ??? Heart Disease Father    ??? Heart Disease Sister    ??? Colon Cancer Sister    ??? Heart Disease Brother        Social History     Tobacco Use   ??? Smoking status: Never Smoker   ??? Smokeless tobacco: Never Used   Substance Use Topics   ??? Alcohol use: No   ??? Drug use: No       Current Outpatient Medications   Medication Sig Dispense Refill   ??? rosuvastatin (CRESTOR) 5 mg tablet Take 1 Tab by mouth nightly. 90 Tab 2   ??? levothyroxine (SYNTHROID) 112 mcg tablet Take 112 mcg by mouth.     ??? cholecalciferol (VITAMIN D3) 2,000 unit cap capsule Vitamin D3 2,000 unit capsule   Take by oral route.     ??? mirabegron ER (MYRBETRIQ) 50 mg ER tablet Myrbetriq 50 mg tablet,extended release   Take 1 tablet every day by oral route for 31 days.     ??? diclofenac (VOLTAREN) 1 % gel Apply two grams by topical route four  times daily to the affected area (s).     ??? desmopressin (DDAVP) 0.1 mg tablet DDAVP 0.1 mg tablet  take one PO qhs     ??? cephALEXin (KEFLEX) 500 mg capsule TAKE 1 CAPSULE BY MOUTH EVERY 12 HOURS FOR 7 DAYS  0   ??? vitamin E (AQUA GEMS) 400 unit capsule vitamin E     ??? co-enzyme Q-10 (CO Q-10) 100 mg capsule Take 100 mg by mouth daily.     ??? magnesium 250 mg tab Take  by mouth.     ??? terbinafine HCl (LAMISIL) 250 mg tablet Take 250 mg by mouth daily.     ??? traZODone (DESYREL) 50 mg tablet Take  by mouth nightly.     ??? docusate sodium (COLACE) 100 mg capsule Take 100 mg by mouth as needed for Constipation.     ??? levothyroxine (SYNTHROID) 100 mcg tablet Take 112 mcg by mouth every other day.     ??? OTHER Allergy shots - weekly.     ??? furosemide (LASIX) 20 mg tablet Take 20 mg by mouth as needed.     ??? cholecalciferol (VITAMIN D3) 5,000 unit capsule Take 1 Cap by mouth every Tuesday Thursday, Saturday.     ??? VENTOLIN HFA 90 mcg/actuation inhaler INHALE TWO PUFFS BY MOUTH EVERY 4 HOURS AS NEEDED FOR  WHEEZING 1 Inhaler 12   ??? Omega-3 Fatty Acids (FISH OIL) 500 mg cap Take  by mouth as needed.     ??? MONTELUKAST SODIUM (SINGULAIR PO) Take  by mouth daily.     ??? fexofenadine (ALLEGRA) 180 mg tablet Take  by mouth daily.     ??? liothyronine (CYTOMEL) 5 mcg tablet Take 1 Tab by mouth daily. 90 Tab 4   ??? omeprazole (PRILOSEC) 40 mg capsule Take 1 Cap by mouth daily. Indications: GASTROESOPHAGEAL REFLUX 90 Cap 6   ??? ascorbic acid (VITAMIN C) 1,000 mg tablet Take 1,000 mg by mouth as needed.     ??? calcium-magnesium-zinc Tab Take 1,000 mg by mouth as needed.           Plan:  Obtain authorization for testing from insurance company.  Report to follow once testing, scoring, and interpretation completed.  ? Organic based neurocognitive issues versus mood disorder or combination of same.  ? Problems organic, functional, or both? This note will not be viewable in MyChart.

## 2018-06-23 NOTE — Progress Notes (Signed)
This was an elective EMG and nerve conduction of patient's lower extremities.  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    Patient history.  Patient has established gait and balance issues and seems to be a collection of positional related dizziness at times as well.  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  Patient had recent blood work which was normal and looked at varied etiology such as deficiency states neuromuscular issues and myopathy.  There were concerns to episodic memory performance and she will be getting neuropsychological testing this afternoon.    Patient exam.  Alert cooperative articulate and appropriate.  Cranial nerves II through XII normal.  Cerebellar testing finger-nose-finger toe finger intact.  Motor 5/5.  Sensory showing slight shading distally of sensibility in the lower extremities.  Reflexes were 1-2+ and Romberg revealed an element of truncal sway.    EMG and nerve conduction findings.    1.  Needle insertion and probing was uniformly normal.  Patient had no difficulty with tolerability on this part.  No evidence of acute denervation or chronic denervation/reinnervation or myopathic potentials.  Motor unit recruitment as to number morphology and time sequencing was appropriate.    2.  The nerve conduction portion revealed normal sensory results for both legs.  There was a severe delay in the left peroneal motor latency at the ankle.  There was uniformly diminished peroneal motor amplitudes right and left legs.  There were right and left peroneal nerve conduction velocity discrepancies at the fibular heads.  There was a right tibial F wave delay compared with left.  Tibial H reflexes were normal.    Impression: This study shows impressive compromise of right and left peroneal motor amplitudes to the EDB. Both EDB's by palpation were  diminutive in size overall. Whether this represents concise and particular pathology at the fibular head is a consideration.  Additional concerns with the left peroneal motor extend to the ankle segment and distal neuropathy.  Clinical correlation is advised.

## 2018-06-23 NOTE — Patient Instructions (Signed)
For EMG and nerve conduction only

## 2018-06-23 NOTE — Progress Notes (Signed)
[facility]  [facility]   [address]   [place], [place] [id number]   [phone] Office   [phone] Fax      Neuropsychological Evaluation Report      Referral:  Dannielle BurnMears, [name], MD, Dr. Velia MeyerHennessey    Leslie Potter is a 78 y.o. right handed divorced female who was accompanied to the initial clinical interview on 06/23/18 .  Please refer to her medical records for details pertaining to her history.  Briefly, the patient reported completed a PhD and denied history of repeating a grade of major learning issues.  She is a retired Engineer, civil (consulting)nurse.  Doctorate in Education.  She has a history of one year decline in memory.  She misplaces things . Loses words.  Starts tasks and does not complete.  Asks same questions over and over.  Sister and daughter notice and are concerned.  She does have an abnormal brain MRI (mild) and no background known stroke, TBI, meningitis/encephalitis, RMS Fever, Lupus, Lyme, etc.  She remains independent for medications, finances, day-to-day chores, etc.  Her mother had dementia. She does get dizzy at times.  No falls.  She was checked for orthostatic hypotension which was negative.  She remains independent for medications, finances, day-to-day chores, etc.  Just did EMG for balance issues.  Lab work negative so far, save for low B12 (low normal), and supplementing.  Sleep study was normal.  Appetite is okay.  Balance is terrible.  She is on Weight Watchers and is 27 pounds so far.  No changes in sense of taste or smell.  She has had depressive bouts in her life but not currently Anxiety about her health, and work was stressful at times.  Migraines have been gone since she stopped working.  No counseling or psychiatrist. She does get allergy shots.        She does repeat herself multiple times today.      EXAM:  MRI BRAIN W WO CONT  ??  INDICATION:    Unsteady gait, Unsteadiness on feet  ??   COMPARISON:  None.  ??  CONTRAST: 14 ml Dotarem.  ??  TECHNIQUE:    Multiplanar multisequence acquisition without and with contrast of the brain.  ??  FINDINGS:  The ventricles are normal in size and position. Scattered white matter T2/FLAIR  hyperintensities, consistent with mild chronic microvascular ischemic disease.  There is no acute infarct, hemorrhage, extra-axial fluid collection, or mass  effect. There is no cerebellar tonsillar herniation. Expected arterial  flow-voids are present. No evidence of abnormal enhancement.  ??  The paranasal sinuses, mastoid air cells, and middle ears are clear. The orbital  contents are within normal limits with bilateral lens implants. No significant  osseous or scalp lesions are identified.  ??  IMPRESSION  IMPRESSION:   ??  1. No acute intracranial abnormality.  2. Mild chronic microvascular ischemic disease.  ??      Neuropsychological Mental Status Exam (NMSE):  Historian: Good  Praxis: No UE apraxia  R/L Orientation: Intact to self and to other  Dress: within normal limits   Weight: within normal limits   Appearance/Hygiene: within normal limits   Gait: within normal limits   Assistive Devices: Glasses  Mood: within normal limits   Affect: within normal limits   Comprehension: within normal limits   Thought Process: within normal limits   Expressive Language: within normal limits   Receptive Language: within normal limits  Motor:  No cognitive or motor perseveration  ETOH: Denied  Tobacco: Denied  Illicit: Denied  SI/HI: Denied  Psychosis: Denied  Insight: Within normal limits  Judgment: Within normal limits  Other Psych:      Past Medical History:   Diagnosis Date   ??? Arthritis     hands/knees   ??? Asthma    ??? Asthma    ??? CAD (coronary artery disease)    ??? Chronic obstructive pulmonary disease (HCC)    ??? Diabetes (HCC)    ??? Dysthymia 05/08/2015   ??? Family history of heart attack 05/08/2015   ??? GERD (gastroesophageal reflux disease)    ??? Headache    ??? High cholesterol     ??? History of cholecystectomy 05/08/2015   ??? History of prediabetes 05/08/2015   ??? History of sinus bradycardia 05/08/2015   ??? Hx of diverticulitis of colon 05/08/2015   ??? Hyperlipidemia    ??? Hypothyroidism 05/08/2015   ??? Joint pain    ??? Memory disorder    ??? Pre-diabetes    ??? Pure hypercholesterolemia 05/08/2015   ??? Seasonal allergic reaction    ??? 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 Thyroid disease    ??? Thyroid disease        Past Surgical History:   Procedure Laterality Date   ??? COLONOSCOPY N/A 04/22/2017    COLONOSCOPY performed by Herbert Moorsuckworth, Paul Frederick Jr., MD at [facility]   ??? COLORECTAL SCRN; HI RISK IND  04/22/2017        ??? HX CATARACT REMOVAL Bilateral 2010   ??? HX CHOLECYSTECTOMY  2016   ??? HX OOPHORECTOMY Bilateral 2017   ??? HX OTHER SURGICAL  2012    colon polyp removed   ??? HX OTHER SURGICAL      ovarian cyst removed   ??? HX RETINAL DETACHMENT REPAIR Right 2008    tears   ??? HX TUBAL LIGATION     ??? UPPER GI ENDOSCOPY,BIOPSY  01/06/2015            Allergies   Allergen Reactions   ??? Simvastatin Myalgia       Family History   Problem Relation Age of Onset   ??? Heart Disease Mother    ??? Heart Disease Father    ??? Heart Disease Sister    ??? Colon Cancer Sister    ??? Heart Disease Brother        Social History     Tobacco Use   ??? Smoking status: Never Smoker   ??? Smokeless tobacco: Never Used   Substance Use Topics   ??? Alcohol use: No   ??? Drug use: No       Current Outpatient Medications   Medication Sig Dispense Refill   ??? rosuvastatin (CRESTOR) 5 mg tablet Take 1 Tab by mouth nightly. 90 Tab 2   ??? levothyroxine (SYNTHROID) 112 mcg tablet Take 112 mcg by mouth.     ??? cholecalciferol (VITAMIN D3) 2,000 unit cap capsule Vitamin D3 2,000 unit capsule   Take by oral route.     ??? mirabegron ER (MYRBETRIQ) 50 mg ER tablet Myrbetriq 50 mg tablet,extended release   Take 1 tablet every day by oral route for 31 days.     ??? diclofenac (VOLTAREN) 1 % gel Apply two grams by topical route four  times daily to the affected area (s).     ??? desmopressin (DDAVP) 0.1 mg tablet DDAVP 0.1 mg tablet   take one PO qhs     ???  cephALEXin (KEFLEX) 500 mg capsule TAKE 1 CAPSULE BY MOUTH EVERY 12 HOURS FOR 7 DAYS  0   ??? vitamin E (AQUA GEMS) 400 unit capsule vitamin E     ??? co-enzyme Q-10 (CO Q-10) 100 mg capsule Take 100 mg by mouth daily.     ??? magnesium 250 mg tab Take  by mouth.     ??? terbinafine HCl (LAMISIL) 250 mg tablet Take 250 mg by mouth daily.     ??? traZODone (DESYREL) 50 mg tablet Take  by mouth nightly.     ??? docusate sodium (COLACE) 100 mg capsule Take 100 mg by mouth as needed for Constipation.     ??? levothyroxine (SYNTHROID) 100 mcg tablet Take 112 mcg by mouth every other day.     ??? OTHER Allergy shots - weekly.     ??? furosemide (LASIX) 20 mg tablet Take 20 mg by mouth as needed.     ??? cholecalciferol (VITAMIN D3) 5,000 unit capsule Take 1 Cap by mouth every Tuesday Thursday, Saturday.     ??? VENTOLIN HFA 90 mcg/actuation inhaler INHALE TWO PUFFS BY MOUTH EVERY 4 HOURS AS NEEDED FOR  WHEEZING 1 Inhaler 12   ??? Omega-3 Fatty Acids (FISH OIL) 500 mg cap Take  by mouth as needed.     ??? MONTELUKAST SODIUM (SINGULAIR PO) Take  by mouth daily.     ??? fexofenadine (ALLEGRA) 180 mg tablet Take  by mouth daily.     ??? liothyronine (CYTOMEL) 5 mcg tablet Take 1 Tab by mouth daily. 90 Tab 4   ??? omeprazole (PRILOSEC) 40 mg capsule Take 1 Cap by mouth daily. Indications: GASTROESOPHAGEAL REFLUX 90 Cap 6   ??? ascorbic acid (VITAMIN C) 1,000 mg tablet Take 1,000 mg by mouth as needed.     ??? calcium-magnesium-zinc Tab Take 1,000 mg by mouth as needed.           Plan:  Obtain authorization for testing from insurance company.  Report to follow once testing, scoring, and interpretation completed.  ? Organic based neurocognitive issues versus mood disorder or combination of same.  ? Problems organic, functional, or both? This note will not be viewable in MyChart.        Neuropsychological Test Results   Patient Testing 06/23/18 Report Completed 06/24/18  A Psychometrist Assisted w/ portions of this evaluation while under my direct  supervision    The following evaluation procedures/tests were administered:      Neuropsychologist Performed, Interpreted, & Reported:  Neuropsychological Mental Status Exam, Revised Memory & Behavior Checklist,  Mini Mental Status Exam, Clock Drawing Test, McGill-Melzack Pain Questionnaire, Test Of Premorbid Functioning, History Taking  & Clinical Interview With The Patient, WCST, PAI, CPT-III, Review Of Available Records.    Psychometrist Administered under Neuropsychologist Supervision & Neuropsychologist Interpreted & Neuropsychologist Reported:  Verbal Fluency Tests, [employer] Test ??? Revised, Trailmaking Test Parts A & B, Wechsler Adult Intelligence Scale - IV, [place] Verbal Learning Test ??? 3, Grooved Pegboard, Beck Depression Inventory ??? II, Beck Anxiety Inventory.   Test Findings:  Test Findings:  Note:  The patient???s raw data have been compared with currently available norms which include demographic corrections for age, gender, and/or education.  Sometimes, the patient???s scores are compared to demographically similar individuals as close to the patient???s age, education level, etc., as possible.  "Average" is viewed as being +/- 1 standard deviation (SD) from the stated mean for a particular test score.  "Low average" is viewed as being  between 1 and 2 SD below the mean, and above average is viewed as being 1 and 2 SD above the mean.  Scores falling in the ???borderline??? range (between 1-1/2 and 2 SD below the mean) are viewed with particular attention as to whether they are normal or abnormal neurocognitive test scores.  Other methods of inference in analyzing the test data are also utilized, including the pattern and range of scores in the profile, bilateral motor functions, and the presence, if any, of pathognomonic signs.         Behaviorally, the patient was friendly and cooperative and appeared motivated to perform well during this examination.   Within this context, the results of this evaluation are viewed as a valid reflection of the patient???s actual neurocognitive and emotional status.       The patient's score of 30/30 on the Mini-Mental Status Exam was normal.  Clock drawing was normal.        Her structured word list fluency, as assessed by the FAS Test, was within the below average range with a T score of 41.  Category fluency was within the above average range with a T score of 57.  Confrontation naming ability, as assessed by the [facility] Test ??? Revised, was within the average range at 55/60 correct (T = 45).  This pattern of performance is not indicative of a patient who is at increased risk for day-to-day problems with verbal fluency or confrontation naming.       The patient was administered the [place]??? Continuous Performance Test ??? III and review of the subscales within this instrument revealed mild concern for inattentiveness without impulsivity.  This pattern of performance is indicative of a patient who is at increased risk for day-to-day problems with sustained visual attention/concentration.     The patient is not showing problems with working memory capacity (23rd %ile) and processing speed (98th %ile) was very superior on the WAIS-IV.  Her Verbal Comprehension Index score of 114 was high average  Her Perceptual Reasoning Index score of 90 was average.  No FSIQ is reported due to domain scatter, as her scores vary from the low average to very superior range. Working memory is lower than expected  based on an assessment of premorbid functioning.   This may signal functional interference.      The patient was administered the [place] Verbal Learning Test  - 3 and generated a normal to superior range learning curve over five repeated auditory word list learning trials.  An interference trial was normal.   Free and cued, short and long delayed recall were well above normal range (superior).   Recognition and forced choice recall were normal, the latter suggesting good effort.   This pattern of performance is not indicative of a patient who is at increased risk for day-to-day problems with auditory learning and/or memory.       Simple timed visual motor sequencing (Trailmaking Test Part A) was within the above average range with a T score of 57.  Her performance on a similar, but more complex task of timed visual motor sequencing (Trailmaking Test Part B) was within the below average range with a T score of 41 (only one sequencing error).  On additional assessment of executive functioning [facility]), the patient was able to complete 3/6 categories on this test (>16th %ile).    This pattern of performance is not indicative of a patient who is at increased risk for day-to-day problems with executive  functioning.     Fine motor dexterity was within the normal range bilaterally.  This does not raise concern for a particularly lateralized brain dysfunction.       The patient rated her current level of pain as "2/5 - Discomforting" on the McGill-Melzack Pain Questionnaire. She reported pain in her back, knees, lower legs, and feet.      Her Beck Depression Inventory ???II score of 6 was within the minimally depressed range.  Her Beck Anxiety Inventory score of 8 reflected mild anxiety.  The patient's responses on the Personality Assessment Inventory were deemed valid for interpretation, though a very high level of defensiveness in responding is noted.  She is reluctant to acknowledge minor faults present in most people, and tends to present herself quite favorably.  Despite this, there are physical signs of depression and anxiety, moodiness, tension, and apprehension, as well as some anger control concerns.  She is self-assured, confident, and dominant.  Her level of self-reported treatment motivation is low.      Impressions & Recommendations:  This is a predominantly normal to superior range Neuropsychological Evaluation with respect to neurocognitive functioning.  In this regard, the only impairment noted relates to sustained attention. Working memory is low in a manner often seen in cases of functional interference.  Mental status, verbal fluency, confrontation naming, auditory learning, auditory memory, processing speed, perceptual reasoning, verbal comprehension, bilateral fine motor dexterity, and executive functioning abilities remain well within or above the normal range.  IQ is very superior.   Emotionally, there is concern for mild anxiety.  She has had bouts of depression in her life.       Thankfully, this profile is not consistent with MCI or dementia.  Instead, this is a chronic ADHD Inattentive issue previously masked by very superior range IQ compounded by anxiety. A mild depression has not been ruled out.  The exam is quite reassuring. The decision to treat her attention deficit is her own to make with the recommendations of her medical care providers.  Some individuals decide to try and treat with non stimulant medications, and others find the exam reassuring and educational and do not wish any further intervention.  Consider psychotherapy on a prn basis.  Stay active mentally, physically, and socially.  I am not concerned about competency, driving, day-to-day supervision, etc. Baseline now established.  Follow up prn.  Clinical correlation is, of course, indicated.        I will discuss these findings with the patient when she follows up with me in the near future.  A follow up Neuropsychological Evaluation is indicated on a prn basis, especially if there are any cognitive and/or emotional changes.      DIAGNOSES:  The Referral Diagnosis of MCI - IS NOT SUPPORTED     Mild Anxiety     History of Episodic Depression     ADHD, Inattentive, Mild, Chronic (previously masked by very superior  range IQ now compounded by age, mood, and physical factors)     The above information is based upon information currently available to me.  If there is any additional information of which I am currently unaware, I would be more than happy to review it upon having it made available to me.  Thank you for the opportunity to see this interesting individual.     Sincerely,       Boruch Manuele A. Wynonia Hazard, PsyD, EdS    CC:  [name], MD , Dr. Peri Maris  Time Documentation:  [id number]*1 [id number]    [id number] x 1  [id number] x 5 Test Administration/Data Gathering By Technician: (3 hours). [id number] x 1 (first 30 minutes), [id number] x 5 (each additional 30 minutes)    96132 x 1  96133 x 1 Testing Evaluation Services by Neuropsychologist (1 hour, 50 minutes) 96132 x 1 (first hour), 96133 x 1 (50 minutes)    Definitions:      96116/96121:  Neurobehavioral Status Exam, Clinical interview.  Clinical assessment of thinking, reasoning and judgment, by neuropsychologist, both face to face time with patient and time interpreting those test results and reporting, first and subsequent hours)    96138/96139: Neuropsychological Test Administration by Technician/Psychometrist, first 30 minutes and each additional 30 minutes.     The above includes: Record review.  Review of history provided by patient.  Review of collaborative information.  Testing by Clinician.  Review of raw data. Scoring. Report writing of individual tests administered by Clinician.  Integration of individual tests administered by psychometrist with NSE/testing by clinician, review of records/history/collaborative information, case Conceptualization, treatment planning, clinical decision making, report writing, coordination Of Care. Psychometry test codes as time spent by psychometrist administering and scoring neurocognitive/psychological tests under supervision of neuropsychologist.  Integral services including scoring of raw data, data interpretation,  case conceptualization, report writing etcetera were initiated after the patient finished testing/raw data collected and was completed on the date the report was signed.

## 2018-06-27 ENCOUNTER — Other Ambulatory Visit: Payer: Self-pay | Admitting: Cardiology

## 2018-08-10 ENCOUNTER — Encounter: Attending: Specialist | Primary: Internal Medicine

## 2018-08-11 NOTE — Telephone Encounter (Signed)
Pt called to cancel appt. I offered to r/s but pt states she will call back to r/s when all of this is over.      Phone:[phone]

## 2018-08-21 ENCOUNTER — Encounter: Attending: Specialist | Primary: Internal Medicine

## 2018-11-05 NOTE — Progress Notes (Signed)
Lipids are at goal. No action needed.Triglycerides are up slightly. I wold decrease your carbohydrate intake.I would like the cholesterol checked again in 6 mo.Continue to eat healthy and clean.

## 2018-11-05 NOTE — Progress Notes (Signed)
Lipids are at goal. No action needed.Triglycerides are up slightly. I wold decrease your carbohydrate intake.I would like the cholesterol checked again in 6 mo.Continue to eat healthy and clean.

## 2018-11-06 LAB — HEPATIC FUNCTION PANEL
ALT (SGPT): 29 IU/L (ref 0–32)
ALT: 29 IU/L (ref 0–32)
AST (SGOT): 25 IU/L (ref 0–40)
AST: 25 IU/L (ref 0–40)
Albumin: 4.6 g/dL (ref 3.7–4.7)
Albumin: 4.6 g/dL (ref 3.7–4.7)
Alk. phosphatase: 70 IU/L (ref 39–117)
Alkaline Phosphatase: 70 IU/L (ref 39–117)
Bilirubin, Direct: 0.12 mg/dL (ref 0.00–0.40)
Bilirubin, direct: 0.12 mg/dL (ref 0.00–0.40)
Bilirubin, total: 0.4 mg/dL (ref 0.0–1.2)
Protein, total: 6.6 g/dL (ref 6.0–8.5)
Total Bilirubin: 0.4 mg/dL (ref 0.0–1.2)
Total Protein: 6.6 g/dL (ref 6.0–8.5)

## 2018-11-06 LAB — CVD REPORT

## 2018-11-06 LAB — LIPID PANEL
Cholesterol, Total: 208 mg/dL — ABNORMAL HIGH (ref 100–199)
Cholesterol, total: 208 mg/dL — ABNORMAL HIGH (ref 100–199)
HDL Cholesterol: 65 mg/dL (ref >39)
HDL: 65 mg/dL (ref >39)
LDL Calculated: 93 mg/dL (ref 0–99)
LDL, calculated: 93 mg/dL (ref 0–99)
Triglyceride: 250 mg/dL — ABNORMAL HIGH (ref 0–149)
Triglycerides: 250 mg/dL — ABNORMAL HIGH (ref 0–149)
VLDL Cholesterol Calculated: 50 mg/dL — ABNORMAL HIGH (ref 5–40)
VLDL, calculated: 50 mg/dL — ABNORMAL HIGH (ref 5–40)

## 2018-11-10 NOTE — Telephone Encounter (Signed)
Spoke to pt,and lab results given,and recommendations as he stated.

## 2018-11-10 NOTE — Telephone Encounter (Signed)
-----   Message from Rushie Chestnut, MD sent at 11/10/2018  9:01 AM EDT -----  Lipids are at goal. No action needed.Triglycerides are up slightly. I wold decrease your carbohydrate intake.I would like the cholesterol checked again in 6 mo.Continue to eat healthy and clean.

## 2018-11-12 NOTE — Telephone Encounter (Signed)
-----   Message from Rolland Bimler sent at 11/12/2018  1:38 PM EDT -----  Regarding: Dr. Teressa Lower  Pt would like a call back regarding her test results. Contact is  [phone]

## 2019-01-14 ENCOUNTER — Other Ambulatory Visit: Payer: Self-pay | Admitting: Cardiology

## 2019-02-15 ENCOUNTER — Inpatient Hospital Stay: Admit: 2019-02-15 | Payer: MEDICARE | Primary: Internal Medicine

## 2019-02-15 ENCOUNTER — Encounter

## 2019-02-15 DIAGNOSIS — R0602 Shortness of breath: Secondary | ICD-10-CM

## 2019-02-16 ENCOUNTER — Encounter: Attending: Specialist | Primary: Internal Medicine

## 2019-03-05 ENCOUNTER — Telehealth: Payer: Self-pay | Admitting: Cardiology

## 2019-03-05 NOTE — Telephone Encounter (Signed)
Virtual Visit Pre-Appointment Phone Call  "(Name), I am calling you today to discuss your upcoming appointment. We are currently trying to limit exposure to the virus that causes COVID-19 by seeing patients at home rather than in the office."  1. "What is the BEST phone number to call the day of the visit?" - include this in appointment notes  2. Do you have or have access to (through a family member/friend) a smartphone with video capability that we can use for your visit?" a. If yes - list this number in appt notes as cell (if different from BEST phone #) and list the appointment type as a VIDEO visit in appointment notes b. If no - list the appointment type as a PHONE visit in appointment notes  3. Confirm consent - "In the setting of the current Covid19 crisis, you are scheduled for a (phone or video) visit with your provider on (date) at (time).  Just as we do with many in-office visits, in order for you to participate in this visit, we must obtain consent.  If you'd like, I can send this to your mychart (if signed up) or email for you to review.  Otherwise, I can obtain your verbal consent now.  All virtual visits are billed to your insurance company just like a normal visit would be.  By agreeing to a virtual visit, we'd like you to understand that the technology does not allow for your provider to perform an examination, and thus may limit your provider's ability to fully assess your condition. If your provider identifies any concerns that need to be evaluated in person, we will make arrangements to do so.  Finally, though the technology is pretty good, we cannot assure that it will always work on either your or our end, and in the setting of a video visit, we may have to convert it to a phone-only visit.  In either situation, we cannot ensure that we have a secure connection.  Are you willing to proceed?" STAFF: Did the patient verbally acknowledge consent to telehealth visit? Document  YES/NO here: yes  4. Advise patient to be prepared - "Two hours prior to your appointment, go ahead and check your blood pressure, pulse, oxygen saturation, and your weight (if you have the equipment to check those) and write them all down. When your visit starts, your provider will ask you for this information. If you have an Apple Watch or Kardia device, please plan to have heart rate information ready on the day of your appointment. Please have a pen and paper handy nearby the day of the visit as well."  5. Give patient instructions for MyChart download to smartphone OR Doximity/Doxy.me as below if video visit (depending on what platform provider is using)  6. Inform patient they will receive a phone call 15 minutes prior to their appointment time (may be from unknown caller ID) so they should be prepared to answer    TELEPHONE CALL NOTE  Abbiegail Landgren has been deemed a candidate for a follow-up tele-health visit to limit community exposure during the Covid-19 pandemic. I spoke with the patient via phone to ensure availability of phone/video source, confirm preferred email & phone number, and discuss instructions and expectations.  I reminded Emmarae Vandeberg to be prepared with any vital sign and/or heart rhythm information that could potentially be obtained via home monitoring, at the time of her visit. I reminded Coreena Rubalcava to expect a phone call prior to her visit.  Amanda Jennings 03/05/2019 9:34 AM   INSTRUCTIONS FOR DOWNLOADING THE MYCHART APP TO SMARTPHONE  - The patient must first make sure to have activated MyChart and know their login information - If Apple, go to [employer] and type in MyChart in the search bar and download the app. If Android, ask patient to go to [employer] and type in [place] in the search bar and download the app. The app is free but as with any other app downloads, their phone may require them to verify saved payment information or  Apple/Android password.  - The patient will need to then log into the app with their MyChart username and password, and select [facility] as their healthcare provider to link the account. When it is time for your visit, go to the MyChart app, find appointments, and click Begin Video Visit. Be sure to Select Allow for your device to access the Microphone and Camera for your visit. You will then be connected, and your provider will be with you shortly.  **If they have any issues connecting, or need assistance please contact MyChart service desk [phone])**  **If using a computer, in order to ensure the best quality for their visit they will need to use either of the following Internet Browsers: [employer], or Google Chrome**  IF USING DOXIMITY or DOXY.ME - The patient will receive a link just prior to their visit by text.     FULL LENGTH CONSENT FOR TELE-HEALTH VISIT   I hereby voluntarily request, consent and authorize [facility] and its employed or contracted physicians, physician assistants, nurse practitioners or other licensed health care professionals (the Practitioner), to provide me with telemedicine health care services (the Services") as deemed necessary by the treating Practitioner. I acknowledge and consent to receive the Services by the Practitioner via telemedicine. I understand that the telemedicine visit will involve communicating with the Practitioner through live audiovisual communication technology and the disclosure of certain medical information by electronic transmission. I acknowledge that I have been given the opportunity to request an in-person assessment or other available alternative prior to the telemedicine visit and am voluntarily participating in the telemedicine visit.  I understand that I have the right to withhold or withdraw my consent to the use of telemedicine in the course of my care at any time, without affecting my right to future care  or treatment, and that the Practitioner or I may terminate the telemedicine visit at any time. I understand that I have the right to inspect all information obtained and/or recorded in the course of the telemedicine visit and may receive copies of available information for a reasonable fee.  I understand that some of the potential risks of receiving the Services via telemedicine include:   Delay or interruption in medical evaluation due to technological equipment failure or disruption;  Information transmitted may not be sufficient (e.g. poor resolution of images) to allow for appropriate medical decision making by the Practitioner; and/or   In rare instances, security protocols could fail, causing a breach of personal health information.  Furthermore, I acknowledge that it is my responsibility to provide information about my medical history, conditions and care that is complete and accurate to the best of my ability. I acknowledge that Practitioner's advice, recommendations, and/or decision may be based on factors not within their control, such as incomplete or inaccurate data provided by me or distortions of diagnostic images or specimens that may result from electronic transmissions. I understand that the  practice of medicine is not an Chief Strategy Officer and that Practitioner makes no warranties or guarantees regarding treatment outcomes. I acknowledge that I will receive a copy of this consent concurrently upon execution via email to the email address I last provided but may also request a printed copy by calling the office of [facility].    I understand that my insurance will be billed for this visit.   I have read or had this consent read to me.  I understand the contents of this consent, which adequately explains the benefits and risks of the Services being provided via telemedicine.   I have been provided ample opportunity to ask questions regarding this consent and the Services and have had  my questions answered to my satisfaction.  I give my informed consent for the services to be provided through the use of telemedicine in my medical care  By participating in this telemedicine visit I agree to the above.

## 2019-03-10 ENCOUNTER — Ambulatory Visit: Attending: Specialist | Primary: Internal Medicine

## 2019-03-10 ENCOUNTER — Ambulatory Visit: Admit: 2019-03-10 | Payer: MEDICARE | Attending: Specialist | Primary: Internal Medicine

## 2019-03-10 DIAGNOSIS — R413 Other amnesia: Secondary | ICD-10-CM

## 2019-03-10 NOTE — Progress Notes (Signed)
Reviewed record in preparation for visit and have necessary documentation  Pt did not bring medication to office visit for review  Information was given to pt on Advanced Directives, Living Will  opportunity was given for questions

## 2019-03-10 NOTE — Progress Notes (Signed)
Neurology Consult      Subjective:      Leslie Potter is a 78 y.o. female who comes in today for results of memory testing specifically as a goes to neuropsych profile generated in February.  Little bit of detachment since that time, because of the coronavirus and the obvious physical limitations imposed with that issue.  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  She has definite intentions of developing her lifestyle and preoccupation and pursue interests that are uniquely hers.  She did end up taking some extra vitamin B12 for a while with her level at 386 for what it is worth.  Does not recite any new problems since last revisit with me and we will leave the revisit open-ended at this point.  If I do not hear from her I will have to assume she is doing well and I sincerely hope that is the case.  Good luck.         Current Outpatient Medications   Medication Sig Dispense Refill   ??? pravastatin (PravachoL) 40 mg tablet Take 40 mg by mouth nightly.     ??? diclofenac (VOLTAREN) 1 % gel Apply two grams by topical route four times daily to the affected area (s).     ??? vitamin E (AQUA GEMS) 400 unit capsule vitamin E     ??? co-enzyme Q-10 (CO Q-10) 100 mg capsule Take 100 mg by mouth daily.     ??? magnesium 250 mg tab Take  by mouth.     ??? docusate sodium (COLACE) 100 mg capsule Take 100 mg by mouth as needed for Constipation.     ??? levothyroxine (SYNTHROID) 100 mcg tablet Take 112 mcg by mouth Daily (before breakfast).     ??? OTHER Allergy shots - weekly.     ??? furosemide (LASIX) 20 mg tablet Take 20 mg by mouth as needed.     ??? cholecalciferol (VITAMIN D3) 5,000 unit capsule Take 1 Cap by mouth daily.     ??? VENTOLIN HFA 90 mcg/actuation inhaler INHALE TWO PUFFS  BY MOUTH EVERY 4 HOURS AS NEEDED FOR  WHEEZING 1 Inhaler 12   ??? Omega-3 Fatty Acids (FISH OIL) 500 mg cap Take  by mouth as needed.     ??? MONTELUKAST SODIUM (SINGULAIR PO) Take  by mouth daily.     ??? fexofenadine (ALLEGRA) 180 mg tablet Take  by mouth daily.     ??? liothyronine (CYTOMEL) 5 mcg tablet Take 1 Tab by mouth daily. 90 Tab 4   ??? omeprazole (PRILOSEC) 40 mg capsule Take 1 Cap by mouth daily. Indications: GASTROESOPHAGEAL REFLUX 90 Cap 6   ??? ascorbic acid (VITAMIN C) 1,000 mg tablet Take 1,000 mg by mouth as needed.     ??? calcium-magnesium-zinc Tab Take 1,000 mg by mouth as needed.     ??? rosuvastatin (CRESTOR) 5 mg tablet Take 1 Tab by mouth nightly. 90 Tab 2   ??? levothyroxine (SYNTHROID) 112 mcg tablet Take 112 mcg by mouth.     ??? cholecalciferol (VITAMIN D3) 2,000 unit cap capsule Vitamin D3 2,000 unit capsule   Take by oral route.     ??? mirabegron ER (MYRBETRIQ) 50 mg ER tablet Myrbetriq 50 mg  tablet,extended release   Take 1 tablet every day by oral route for 31 days.     ??? desmopressin (DDAVP) 0.1 mg tablet DDAVP 0.1 mg tablet   take one PO qhs     ??? cephALEXin (KEFLEX) 500 mg capsule TAKE 1 CAPSULE BY MOUTH EVERY 12 HOURS FOR 7 DAYS  0   ??? terbinafine HCl (LAMISIL) 250 mg tablet Take 250 mg by mouth daily.     ??? traZODone (DESYREL) 50 mg tablet Take  by mouth nightly.        Allergies   Allergen Reactions   ??? Simvastatin Myalgia     Past Medical History:   Diagnosis Date   ??? Arthritis     hands/knees   ??? Asthma    ??? Asthma    ??? CAD (coronary artery disease)    ??? Chronic obstructive pulmonary disease ([place])    ??? Diabetes ([place])    ??? Dysthymia 05/08/2015   ??? Family history of heart attack 05/08/2015   ??? GERD (gastroesophageal reflux disease)    ??? Headache    ??? High cholesterol    ??? History of cholecystectomy 05/08/2015   ??? History of prediabetes 05/08/2015   ??? History of sinus bradycardia 05/08/2015   ??? Hx of diverticulitis of colon 05/08/2015   ??? Hyperlipidemia    ??? Hypothyroidism 05/08/2015   ??? Joint  pain    ??? Memory disorder    ??? Pre-diabetes    ??? Pure hypercholesterolemia 05/08/2015   ??? Seasonal allergic reaction    ??? 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 Thyroid disease    ??? Thyroid disease       Past Surgical History:   Procedure Laterality Date   ??? COLONOSCOPY N/A 04/22/2017    COLONOSCOPY performed by Eugenie Filler., MD at [facility]   ??? COLORECTAL SCRN; HI RISK IND  04/22/2017        ??? HX CATARACT REMOVAL Bilateral 2010   ??? HX CHOLECYSTECTOMY  2016   ??? HX OOPHORECTOMY Bilateral 2017   ??? HX OTHER SURGICAL  2012    colon polyp removed   ??? HX OTHER SURGICAL      ovarian cyst removed   ??? HX RETINAL DETACHMENT REPAIR Right 2008    tears   ??? HX TUBAL LIGATION     ??? UPPER GI ENDOSCOPY,BIOPSY  01/06/2015           Social History     Socioeconomic History   ??? Marital status: DIVORCED     Spouse name: Not on file   ??? Number of children: Not on file   ??? Years of education: Not on file   ??? Highest education level: Not on file   Occupational History   ??? Not on file   Social Needs   ??? Financial resource strain: Not on file   ??? Food insecurity     Worry: Not on file     Inability: Not on file   ??? Transportation needs     Medical: Not on file     Non-medical: Not on file   Tobacco Use   ??? Smoking status: Never Smoker   ??? Smokeless tobacco: Never Used   Substance and Sexual Activity   ??? Alcohol use: No   ??? Drug use: No   ??? Sexual activity: Never   Lifestyle   ??? Physical activity     Days per week: Not on file     Minutes per  session: Not on file   ??? Stress: Not on file   Relationships   ??? Social Wellsite geologistconnections     Talks on phone: Not on file     Gets together: Not on file     Attends religious service: Not on file     Active member of club or organization: Not on file     Attends meetings of clubs or organizations: Not on file     Relationship status: Not on file   ??? Intimate partner violence     Fear of current or ex partner: Not on file     Emotionally abused: Not on file     Physically abused: Not on file      Forced sexual activity: Not on file   Other Topics Concern   ??? Military Service Not Asked   ??? Blood Transfusions Not Asked   ??? Caffeine Concern Not Asked   ??? Occupational Exposure Not Asked   ??? Hobby Hazards Not Asked   ??? Sleep Concern Not Asked   ??? Stress Concern Not Asked   ??? Weight Concern Not Asked   ??? Special Diet Not Asked   ??? Back Care Not Asked   ??? Exercise Not Asked   ??? Bike Helmet Not Asked   ??? Seat Belt Not Asked   ??? Self-Exams Not Asked   Social History Narrative   ??? Not on file      Family History   Problem Relation Age of Onset   ??? Heart Disease Mother    ??? Heart Disease Father    ??? Heart Disease Sister    ??? Colon Cancer Sister    ??? Heart Disease Brother       Visit Vitals  BP 112/66   Pulse 66   Temp 97 ??F (36.1 ??C)   Resp 16   Ht 5\' 4"  (1.626 m)   Wt 74.6 kg (164 lb 6.4 oz)   SpO2 99%   BMI 28.22 kg/m??        Review of Systems:   A comprehensive review of systems was negative except for that written in the HPI.      Neuro Exam:     Appearance:  The patient is well developed, well nourished, provides a coherent history and is in no acute distress.   Mental Status: Oriented to time, place and person. Mood and affect appropriate.   Cranial Nerves:   Intact visual fields. Fundi are benign. PERLA, EOM's full, no nystagmus, no ptosis. Facial sensation is normal. Corneal reflexes are intact. Facial movement is symmetric. Hearing is normal bilaterally. Palate is midline with normal sternocleidomastoid and trapezius muscles are normal. Tongue is midline.   Motor:  5/5 strength in upper and lower proximal and distal muscles. Normal bulk and tone. No fasciculations.   Reflexes:   Deep tendon reflexes 2+/4 and symmetrical.   Sensory:   Normal to touch, pinprick and vibration.   Gait:  Normal gait.   Tremor:   No tremor noted.   Cerebellar:  No cerebellar signs present.   Neurovascular:  Normal heart sounds and regular rhythm, peripheral pulses intact, and no carotid bruits.            Assessment:   Memory  concerns.  Fortunately had a very promising neuropsych test result and nothing from ancillary testing to be concerned about.  Suggestions to stay as reasonably safely busy as possible and hopefully beyond the virus constraints of which we all have to follow,  she can find sufficient means to remain socially engaged.  Good luck.      Plan:   Revisit as needed.  Signed by :  Chesley Noon IV MD

## 2019-03-10 NOTE — Patient Instructions (Signed)
Went over patient's test results and fortunately it was able to tell her that she is doing fine.  Simply advised to stay as reasonably and safely busy as possible.  The coronavirus precautions and limitations certainly put a dampen her on socialization possibilities but will try to search at the opportunities as they present themselves and seem reasonable.  Good luck.

## 2019-03-10 NOTE — Progress Notes (Signed)
Neurology Consult      Subjective:      Leslie Potter is a 78 y.o. female who comes in today for results of memory testing specifically as a goes to neuropsych profile generated in February.  Little bit of detachment since that time, because of the coronavirus and the obvious physical limitations imposed with that issue.  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  She has definite intentions of developing her lifestyle and preoccupation and pursue interests that are uniquely hers.  She did end up taking some extra vitamin B12 for a while with her level at 386 for what it is worth.  Does not recite any new problems since last revisit with me and we will leave the revisit open-ended at this point.  If I do not hear from her I will have to assume she is doing well and I sincerely hope that is the case.  Good luck.         Current Outpatient Medications   Medication Sig Dispense Refill   ??? pravastatin (PravachoL) 40 mg tablet Take 40 mg by mouth nightly.     ??? diclofenac (VOLTAREN) 1 % gel Apply two grams by topical route four times daily to the affected area (s).     ??? vitamin E (AQUA GEMS) 400 unit capsule vitamin E     ??? co-enzyme Q-10 (CO Q-10) 100 mg capsule Take 100 mg by mouth daily.     ??? magnesium 250 mg tab Take  by mouth.     ??? docusate sodium (COLACE) 100 mg capsule Take 100 mg by mouth as needed for Constipation.     ??? levothyroxine (SYNTHROID) 100 mcg tablet Take 112 mcg by mouth Daily (before breakfast).     ??? OTHER Allergy shots - weekly.     ??? furosemide (LASIX) 20 mg tablet Take 20 mg by mouth as needed.     ??? cholecalciferol (VITAMIN D3) 5,000 unit capsule Take 1 Cap by mouth daily.      ??? VENTOLIN HFA 90 mcg/actuation inhaler INHALE TWO PUFFS BY MOUTH EVERY 4 HOURS AS NEEDED FOR  WHEEZING 1 Inhaler 12   ??? Omega-3 Fatty Acids (FISH OIL) 500 mg cap Take  by mouth as needed.     ??? MONTELUKAST SODIUM (SINGULAIR PO) Take  by mouth daily.     ??? fexofenadine (ALLEGRA) 180 mg tablet Take  by mouth daily.     ??? liothyronine (CYTOMEL) 5 mcg tablet Take 1 Tab by mouth daily. 90 Tab 4   ??? omeprazole (PRILOSEC) 40 mg capsule Take 1 Cap by mouth daily. Indications: GASTROESOPHAGEAL REFLUX 90 Cap 6   ??? ascorbic acid (VITAMIN C) 1,000 mg tablet Take 1,000 mg by mouth as needed.     ??? calcium-magnesium-zinc Tab Take 1,000 mg by mouth as needed.     ??? rosuvastatin (CRESTOR) 5 mg tablet Take 1 Tab by mouth nightly. 90 Tab 2   ??? levothyroxine (SYNTHROID) 112 mcg tablet Take 112 mcg by mouth.     ??? cholecalciferol (VITAMIN D3) 2,000 unit cap capsule Vitamin D3 2,000 unit capsule   Take by oral route.     ??? mirabegron ER (MYRBETRIQ) 50 mg ER tablet Myrbetriq 50 mg  tablet,extended release   Take 1 tablet every day by oral route for 31 days.     ??? desmopressin (DDAVP) 0.1 mg tablet DDAVP 0.1 mg tablet   take one PO qhs     ??? cephALEXin (KEFLEX) 500 mg capsule TAKE 1 CAPSULE BY MOUTH EVERY 12 HOURS FOR 7 DAYS  0   ??? terbinafine HCl (LAMISIL) 250 mg tablet Take 250 mg by mouth daily.     ??? traZODone (DESYREL) 50 mg tablet Take  by mouth nightly.        Allergies   Allergen Reactions   ??? Simvastatin Myalgia     Past Medical History:   Diagnosis Date   ??? Arthritis     hands/knees   ??? Asthma    ??? Asthma    ??? CAD (coronary artery disease)    ??? Chronic obstructive pulmonary disease (HCC)    ??? Diabetes (HCC)    ??? Dysthymia 05/08/2015   ??? Family history of heart attack 05/08/2015   ??? GERD (gastroesophageal reflux disease)    ??? Headache    ??? High cholesterol    ??? History of cholecystectomy 05/08/2015   ??? History of prediabetes 05/08/2015   ??? History of sinus bradycardia 05/08/2015   ??? Hx of diverticulitis of colon 05/08/2015    ??? Hyperlipidemia    ??? Hypothyroidism 05/08/2015   ??? Joint pain    ??? Memory disorder    ??? Pre-diabetes    ??? Pure hypercholesterolemia 05/08/2015   ??? Seasonal allergic reaction    ??? 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 Thyroid disease    ??? Thyroid disease       Past Surgical History:   Procedure Laterality Date   ??? COLONOSCOPY N/A 04/22/2017    COLONOSCOPY performed by Herbert Moors., MD at [facility]   ??? COLORECTAL SCRN; HI RISK IND  04/22/2017        ??? HX CATARACT REMOVAL Bilateral 2010   ??? HX CHOLECYSTECTOMY  2016   ??? HX OOPHORECTOMY Bilateral 2017   ??? HX OTHER SURGICAL  2012    colon polyp removed   ??? HX OTHER SURGICAL      ovarian cyst removed   ??? HX RETINAL DETACHMENT REPAIR Right 2008    tears   ??? HX TUBAL LIGATION     ??? UPPER GI ENDOSCOPY,BIOPSY  01/06/2015           Social History     Socioeconomic History   ??? Marital status: DIVORCED     Spouse name: Not on file   ??? Number of children: Not on file   ??? Years of education: Not on file   ??? Highest education level: Not on file   Occupational History   ??? Not on file   Social Needs   ??? Financial resource strain: Not on file   ??? Food insecurity     Worry: Not on file     Inability: Not on file   ??? Transportation needs     Medical: Not on file     Non-medical: Not on file   Tobacco Use   ??? Smoking status: Never Smoker   ??? Smokeless tobacco: Never Used   Substance and Sexual Activity   ??? Alcohol use: No   ??? Drug use: No   ??? Sexual activity: Never   Lifestyle   ??? Physical activity     Days per week: Not on file     Minutes per  session: Not on file   ??? Stress: Not on file   Relationships   ??? Social Product manager on phone: Not on file     Gets together: Not on file     Attends religious service: Not on file     Active member of club or organization: Not on file     Attends meetings of clubs or organizations: Not on file     Relationship status: Not on file   ??? Intimate partner violence     Fear of current or ex partner: Not on file      Emotionally abused: Not on file     Physically abused: Not on file     Forced sexual activity: Not on file   Other Topics Concern   ??? Military Service Not Asked   ??? Blood Transfusions Not Asked   ??? Caffeine Concern Not Asked   ??? Occupational Exposure Not Asked   ??? Hobby Hazards Not Asked   ??? Sleep Concern Not Asked   ??? Stress Concern Not Asked   ??? Weight Concern Not Asked   ??? Special Diet Not Asked   ??? Back Care Not Asked   ??? Exercise Not Asked   ??? Bike Helmet Not Asked   ??? Seat Belt Not Asked   ??? Self-Exams Not Asked   Social History Narrative   ??? Not on file      Family History   Problem Relation Age of Onset   ??? Heart Disease Mother    ??? Heart Disease Father    ??? Heart Disease Sister    ??? Colon Cancer Sister    ??? Heart Disease Brother       Visit Vitals  BP 112/66   Pulse 66   Temp 97 ??F (36.1 ??C)   Resp 16   Ht 5\' 4"  (1.626 m)   Wt 74.6 kg (164 lb 6.4 oz)   SpO2 99%   BMI 28.22 kg/m??        Review of Systems:   A comprehensive review of systems was negative except for that written in the HPI.      Neuro Exam:     Appearance:  The patient is well developed, well nourished, provides a coherent history and is in no acute distress.   Mental Status: Oriented to time, place and person. Mood and affect appropriate.   Cranial Nerves:   Intact visual fields. Fundi are benign. PERLA, EOM's full, no nystagmus, no ptosis. Facial sensation is normal. Corneal reflexes are intact. Facial movement is symmetric. Hearing is normal bilaterally. Palate is midline with normal sternocleidomastoid and trapezius muscles are normal. Tongue is midline.   Motor:  5/5 strength in upper and lower proximal and distal muscles. Normal bulk and tone. No fasciculations.   Reflexes:   Deep tendon reflexes 2+/4 and symmetrical.   Sensory:   Normal to touch, pinprick and vibration.   Gait:  Normal gait.   Tremor:   No tremor noted.   Cerebellar:  No cerebellar signs present.    Neurovascular:  Normal heart sounds and regular rhythm, peripheral pulses intact, and no carotid bruits.            Assessment:   Memory concerns.  Fortunately had a very promising neuropsych test result and nothing from ancillary testing to be concerned about.  Suggestions to stay as reasonably safely busy as possible and hopefully beyond the virus constraints of which we all have to follow,  she can find sufficient means to remain socially engaged.  Good luck.      Plan:   Revisit as needed.  Signed by :  Chesley Noon IV MD

## 2019-03-15 ENCOUNTER — Encounter: Payer: Self-pay | Admitting: Cardiology

## 2019-03-15 ENCOUNTER — Telehealth (INDEPENDENT_AMBULATORY_CARE_PROVIDER_SITE_OTHER): Payer: Medicare Other | Admitting: Cardiology

## 2019-03-15 VITALS — BP 112/64 | HR 48 | Ht 64.0 in | Wt 160.0 lb

## 2019-03-15 DIAGNOSIS — R001 Bradycardia, unspecified: Secondary | ICD-10-CM

## 2019-03-15 DIAGNOSIS — I2510 Atherosclerotic heart disease of native coronary artery without angina pectoris: Secondary | ICD-10-CM

## 2019-03-15 DIAGNOSIS — E782 Mixed hyperlipidemia: Secondary | ICD-10-CM

## 2019-03-15 NOTE — Progress Notes (Signed)
Virtual Visit via Telephone Note   This visit type was conducted due to national recommendations for restrictions regarding the COVID-19 Pandemic (e.g. social distancing) in an effort to limit this patient's exposure and mitigate transmission in our community.  Due to her co-morbid illnesses, this patient is at least at moderate risk for complications without adequate follow up.  This format is felt to be most appropriate for this patient at this time.  The patient did not have access to video technology/had technical difficulties with video requiring transitioning to audio format only (telephone).  All issues noted in this document were discussed and addressed.  No physical exam could be performed with this format.  Please refer to the patient's chart for her  consent to telehealth for [facility]   Date:  03/15/2019   ID:  Amanda Jennings, DOB [birth year], MRN [id number]  Patient Location: Home Provider Location: Office  PCP:  System, Pcp Not In  Cardiologist:  Nona Dell, MD Electrophysiologist:  None   Evaluation Performed:  Follow-Up Visit  Chief Complaint:   Cardiac follow-up  History of Present Illness:    Amanda Jennings is an 78 y.o. female last seen in October 2019.  We spoke by phone today.  She states that she was having more trouble with breathlessness over the summer, she suspects possibly related to her asthma and allergies, but also had to have adjustment in her thyroid regimen.  She does not report any obvious angina symptoms and typically has NYHA class II dyspnea with ADLs.  No palpitations or syncope.  She also follows with a cardiologist in [place], sees Dr. Lorella Nimrod, I reviewed the note from January of this year.  She was taken off Crestor due to myalgias.  Reportedly, calcium score in May 2019 was 29.  Most recent LDL was down to 93 in June.  She is taking Pravachol 3 times a week, tolerating without symptoms.   The patient does not have symptoms  concerning for COVID-19 infection (fever, chills, cough, or new shortness of breath).    Past Medical History:  Diagnosis Date  . Arthritis   . Asthma   . Bradycardia   . Cataracts, bilateral   . Cholelithiasis   . Coronary atherosclerosis of native coronary artery    Mild atherosclerosis  . Depression   . GERD (gastroesophageal reflux disease)   . History of positive PPD   . Hx of migraines   . Hyperlipidemia   . Hypothyroidism   . Osteopenia    Past Surgical History:  Procedure Laterality Date  . Bilateral tubal ligation    . Cataract surgery    . Colonic polyp resection       Current Meds  Medication Sig  . albuterol (PROVENTIL HFA;VENTOLIN HFA) 108 (90 Base) MCG/ACT inhaler Inhale into the lungs every 6 (six) hours as needed for wheezing or shortness of breath.  . Ascorbic Acid (VITAMIN C ER PO) Take 1 tablet by mouth daily.   . Calcium in Bone Mineral Cmplx (OSTIGEN) 350 MG MISC Take 2 each by mouth.   . Calcium-Magnesium 500-250 MG TABS Take 1 tablet by mouth daily.   . Cholecalciferol (VITAMIN D3) 5000 UNITS CAPS Take 1 capsule by mouth daily.  . Coenzyme Q10 (CO Q 10 PO) Take by mouth daily.   . fexofenadine (ALLEGRA) 180 MG tablet Take 180 mg by mouth daily.  . furosemide (LASIX) 20 MG tablet TAKE 1 TABLET BY MOUTH AS NEEDED FOR  LEG  SWELLING  . levothyroxine (  SYNTHROID) 100 MCG tablet Take 100 mcg by mouth daily before breakfast.  . liothyronine (CYTOMEL) 5 MCG tablet Take 5 mcg by mouth daily.  . montelukast (SINGULAIR) 10 MG tablet Take 10 mg by mouth every morning.   . Omega-3 Fatty Acids (FISH OIL) 1000 MG CAPS Take 1 capsule by mouth daily.   . pravastatin (PRAVACHOL) 40 MG tablet Take 40 mg by mouth. Takes 3 x per week.  . tiotropium (SPIRIVA HANDIHALER) 18 MCG inhalation capsule Place 18 mcg into inhaler and inhale daily.  Marland Kitchen. UNKNOWN TO PATIENT 2 time weekly allergy injection - doesn't know the name or dose     Allergies:   Ciprofloxacin   Social  History   Tobacco Use  . Smoking status: Never Smoker  . Smokeless tobacco: Never Used  Substance Use Topics  . Alcohol use: No    Alcohol/week: 0.0 standard drinks  . Drug use: No     Family Hx: The patient's family history is not on file.  ROS:   Please see the history of present illness.    Arthritic pains. All other systems reviewed and are negative.   Prior CV studies:   The following studies were reviewed today:  LexiscanMyoview 01/02/2016:  There was no ST segment deviation noted during stress.  The study is normal. There are no perfusion defects consistent with prior infarction or current ischemia.  This is a low risk study.  The left ventricular ejection fraction is normal (55-65%).  Labs/Other Tests and Data Reviewed:    EKG:  An ECG dated 03/03/2018 was personally reviewed today and demonstrated:  Sinus bradycardia with low voltage.  Recent Labs:  June 2020: AST 25, ALT 29, cholesterol 208, triglycerides 250, HDL 65, LDL 93 January 2020: CRP 1  Wt Readings from Last 3 Encounters:  03/15/19 160 lb (72.6 kg)  03/03/18 160 lb (72.6 kg)  02/27/17 176 lb (79.8 kg)     Objective:    Vital Signs:  BP 112/64   Pulse (!) 48   Ht 5\' 4"  (1.626 m)   Wt 160 lb (72.6 kg)   BMI 27.46 kg/m    Patient spoke in full sentences, not short of breath. No audible wheezing or coughing. Speech pattern normal.  ASSESSMENT & PLAN:    1.  Coronary atherosclerosis, mild by previous assessment and otherwise with a relatively low calcium score last year and low risk Myoview in 2017.  She does not report any obvious angina at this time.  Would continue low-dose aspirin and statin therapy.  ECG for next visit, sooner if symptoms intervene.  2.  Mixed hyperlipidemia, on Pravachol.  Last LDL 93.  3.  Sinus bradycardia, not clearly symptom provoking.  She is not on AV nodal blockers.  Would continue with observation at this point.  COVID-19 Education: The signs and  symptoms of COVID-19 were discussed with the patient and how to seek care for testing (follow up with PCP or arrange E-visit).  The importance of social distancing was discussed today.  Time:   Today, I have spent 7 minutes with the patient with telehealth technology discussing the above problems.     Medication Adjustments/Labs and Tests Ordered: Current medicines are reviewed at length with the patient today.  Concerns regarding medicines are outlined above.   Tests Ordered: No orders of the defined types were placed in this encounter.   Medication Changes: No orders of the defined types were placed in this encounter.   Follow Up:  In  Person 1 year in the [place] office.  Signed, Rozann Lesches, MD  03/15/2019 2:34 PM    [place]

## 2019-03-15 NOTE — Patient Instructions (Signed)

## 2019-03-26 ENCOUNTER — Other Ambulatory Visit: Payer: Self-pay | Admitting: Cardiology

## 2019-07-09 NOTE — Telephone Encounter (Signed)
Pharmacy confirmed

## 2019-07-09 NOTE — Telephone Encounter (Signed)
Patient would like to know if she needs to have labs drawn. Please advise.    Phone: [phone]

## 2019-07-09 NOTE — Telephone Encounter (Signed)
Dec 2020 labs to be faxed - please call pt to schedule a VV appt.

## 2019-07-15 MED ORDER — ROSUVASTATIN 5 MG TAB
5 mg | ORAL_TABLET | Freq: Every evening | ORAL | 0 refills | Status: DC
Start: 2019-07-15 — End: 2019-09-14

## 2019-07-15 NOTE — Telephone Encounter (Signed)
rx approved by Dr Doloresco

## 2019-07-22 NOTE — Telephone Encounter (Signed)
Patient states that she is returning a call to the office. Please advise.    Phone: [phone]

## 2019-07-22 NOTE — Telephone Encounter (Signed)
lft msg for return call - needs lipids - PCP did not draw - not in labs faxed to office.

## 2019-07-22 NOTE — Telephone Encounter (Signed)
Pt to have lipids drawn by endocrinologist in April

## 2019-08-13 ENCOUNTER — Telehealth: Attending: Specialist | Primary: Internal Medicine

## 2019-08-13 ENCOUNTER — Telehealth: Admit: 2019-08-13 | Payer: MEDICARE | Attending: Specialist | Primary: Internal Medicine

## 2019-08-13 DIAGNOSIS — R001 Bradycardia, unspecified: Secondary | ICD-10-CM

## 2019-08-13 NOTE — Progress Notes (Addendum)
VIRTUAL VISIT DOCUMENTATION     Pursuant to the emergency declaration under the [place], 1135 waiver authority and the [employer] Act, this Virtual  Visit was conducted, with patient's consent, to reduce the patient's risk of exposure to COVID-19 and provide continuity of care for an established patient.     Services were provided through a video synchronous discussion virtually to substitute for in-person clinic visit.    CHIEF COMPLAINT      Leslie Potter is a 79 y.o. female who was seen by synchronous (real-time) audio-video technology on 08/13/2019.  Patient is being seen today for shortness of breath.     ASSESSMENT        ICD-10-CM ICD-9-CM    1. Bradycardia  R00.1 427.89    2. Dyslipidemia  E78.5 272.4    3. Fatigue, unspecified type  R53.83 780.79    4. Coronary artery disease involving native coronary artery of native heart without angina pectoris  I25.10 414.01         PLAN   1. Bradycardia.  -We will check her heart rate during the cardiac testing  -May require a 24-hour Holter monitor and will discuss this with her at the time of her cardiac testing    2. Dyslipidemia on Crestor   -Her last LDL was 118 and she is on 5 mg of Crestor.  She is not describing any muscle aching at this time    3. Possible OSA with periods of apnea per her daughter.   - Sleep study was negative.    4.  Shortness of breath/history of moderate asthma as she describes it  -Unclear if this is anginal equivalent and she is concerned about as well so we will go forward with a Lexiscan Cardiolite and repeat echocardiogram.  Her aortic valve did not demonstrate any stenosis in the past and she was asking about this.  I will also check a proBNp    Follow-up with me in 6 weeks or as needed    We discussed the expected course, resolution and complications of the diagnosis(es) in detail.  Medication risks, benefits, costs, interactions,  and alternatives were discussed as indicated.  I advised her to contact the office if her condition worsens, changes or fails to improve as anticipated. She expressed understanding with the diagnosis(es) and plan    HISTORY OF PRESENTING ILLNESS      Leslie Potter is a 79 y.o. female  with hypercholesterolemia and family hx of MI, dyslipidemia last seen by me 4 months ago.    Cardiac risk factors: dyslipidemia, obesity, post-menopausal, family hx  I have personally obtained the history from the patient.    HISTORY OF PRESENTING ILLNESS     Leslie Potter states that over the last year she is continue to have difficulty with shortness of breath with activity.  She says it has been about a year ongoing and has been seeing pulmonary.  She had pulmonary test done 29 January and it did show some progression of disease but nothing of significance.  They placed her on Spiriva which she is not sure if it is really helped a lot.  The other concern she has is that every time she becomes active her heart rate will go up into the 109 range and sometimes up into the 80s which is concerning to her.  She has had one episode where she was in the shower and felt dizzy like  she was going to pass out.  She did go to urgent care center March 1 because of some of the symptoms chest x-ray she said demonstrated no significant abnormalities.  She did have a CT of her chest in the past with less than 4 mm nodules noted and there was no recommendation follow-up at that time.  She does use her Lasix and notes some edema at times.  She is has gained about 8 pounds over Covid.  She does also say that she is described as having moderate asthma.     ACTIVE PROBLEM LIST     Patient Active Problem List    Diagnosis Date Noted   ??? 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 Hypothyroidism 05/08/2015   ??? Pure hypercholesterolemia 05/08/2015   ??? History of sinus bradycardia 05/08/2015   ??? Family history of heart attack 05/08/2015   ??? History of  prediabetes 05/08/2015   ??? Hx of diverticulitis of colon 05/08/2015   ??? History of cholecystectomy 05/08/2015   ??? Dysthymia 05/08/2015           PAST MEDICAL HISTORY     Past Medical History:   Diagnosis Date   ??? Arthritis     hands/knees   ??? Asthma    ??? Asthma    ??? CAD (coronary artery disease)    ??? Chronic obstructive pulmonary disease ([place])    ??? Diabetes ([place])    ??? Dysthymia 05/08/2015   ??? Family history of heart attack 05/08/2015   ??? GERD (gastroesophageal reflux disease)    ??? Headache    ??? High cholesterol    ??? History of cholecystectomy 05/08/2015   ??? History of prediabetes 05/08/2015   ??? History of sinus bradycardia 05/08/2015   ??? Hx of diverticulitis of colon 05/08/2015   ??? Hyperlipidemia    ??? Hypothyroidism 05/08/2015   ??? Joint pain    ??? Memory disorder    ??? Pre-diabetes    ??? Pure hypercholesterolemia 05/08/2015   ??? Seasonal allergic reaction    ??? 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 Thyroid disease    ??? Thyroid disease            PAST SURGICAL HISTORY     Past Surgical History:   Procedure Laterality Date   ??? COLONOSCOPY N/A 04/22/2017    COLONOSCOPY performed by Eugenie Filler., MD at [facility]   ??? COLORECTAL SCRN; HI RISK IND  04/22/2017        ??? HX CATARACT REMOVAL Bilateral 2010   ??? HX CHOLECYSTECTOMY  2016   ??? HX OOPHORECTOMY Bilateral 2017   ??? HX OTHER SURGICAL  2012    colon polyp removed   ??? HX OTHER SURGICAL      ovarian cyst removed   ??? HX RETINAL DETACHMENT REPAIR Right 2008    tears   ??? HX TUBAL LIGATION     ??? UPPER GI ENDOSCOPY,BIOPSY  01/06/2015               ALLERGIES     Allergies   Allergen Reactions   ??? Simvastatin Myalgia          FAMILY HISTORY     Family History   Problem Relation Age of Onset   ??? Heart Disease Mother    ??? Heart Disease Father    ??? Heart Disease Sister    ??? Colon Cancer Sister    ??? Heart Disease Brother     negative for cardiac  disease       SOCIAL HISTORY     Social History     Socioeconomic History   ??? Marital status: DIVORCED     Spouse name: Not on  file   ??? Number of children: Not on file   ??? Years of education: Not on file   ??? Highest education level: Not on file   Tobacco Use   ??? Smoking status: Never Smoker   ??? Smokeless tobacco: Never Used   Substance and Sexual Activity   ??? Alcohol use: No   ??? Drug use: No   ??? Sexual activity: Never   Other Topics Concern         MEDICATIONS     Current Outpatient Medications   Medication Sig   ??? rosuvastatin (Crestor) 5 mg tablet Take 1 Tab by mouth nightly.   ??? levothyroxine (SYNTHROID) 112 mcg tablet Take 112 mcg by mouth.   ??? cholecalciferol (VITAMIN D3) 2,000 unit cap capsule Vitamin D3 2,000 unit capsule   Take by oral route.   ??? mirabegron ER (MYRBETRIQ) 50 mg ER tablet Myrbetriq 50 mg tablet,extended release   Take 1 tablet every day by oral route for 31 days.   ??? diclofenac (VOLTAREN) 1 % gel Apply two grams by topical route four times daily to the affected area (s).   ??? desmopressin (DDAVP) 0.1 mg tablet DDAVP 0.1 mg tablet   take one PO qhs   ??? cephALEXin (KEFLEX) 500 mg capsule TAKE 1 CAPSULE BY MOUTH EVERY 12 HOURS FOR 7 DAYS   ??? vitamin E (AQUA GEMS) 400 unit capsule vitamin E   ??? co-enzyme Q-10 (CO Q-10) 100 mg capsule Take 100 mg by mouth daily.   ??? magnesium 250 mg tab Take  by mouth.   ??? terbinafine HCl (LAMISIL) 250 mg tablet Take 250 mg by mouth daily.   ??? traZODone (DESYREL) 50 mg tablet Take  by mouth nightly.   ??? docusate sodium (COLACE) 100 mg capsule Take 100 mg by mouth as needed for Constipation.   ??? levothyroxine (SYNTHROID) 100 mcg tablet Take 112 mcg by mouth Daily (before breakfast).   ??? OTHER Allergy shots - weekly.   ??? furosemide (LASIX) 20 mg tablet Take 20 mg by mouth as needed.   ??? cholecalciferol (VITAMIN D3) 5,000 unit capsule Take 1 Cap by mouth daily.   ??? VENTOLIN HFA 90 mcg/actuation inhaler INHALE TWO PUFFS BY MOUTH EVERY 4 HOURS AS NEEDED FOR  WHEEZING   ??? Omega-3 Fatty Acids (FISH OIL) 500 mg cap Take  by mouth as needed.   ??? MONTELUKAST SODIUM (SINGULAIR PO) Take  by mouth  daily.   ??? fexofenadine (ALLEGRA) 180 mg tablet Take  by mouth daily.   ??? liothyronine (CYTOMEL) 5 mcg tablet Take 1 Tab by mouth daily.   ??? omeprazole (PRILOSEC) 40 mg capsule Take 1 Cap by mouth daily. Indications: GASTROESOPHAGEAL REFLUX   ??? ascorbic acid (VITAMIN C) 1,000 mg tablet Take 1,000 mg by mouth as needed.   ??? calcium-magnesium-zinc Tab Take 1,000 mg by mouth as needed.     No current facility-administered medications for this visit.        I have reviewed the nurses notes, vitals, problem list, allergy list, medical history, family, social history and medications.       REVIEW OF SYMPTOMS     Constitutional: Negative for fever, chills, malaise/fatigue and diaphoresis.   Respiratory: Negative for cough, hemoptysis, sputum production, shortness of breath and wheezing.  Cardiovascular: Negative for chest pain, palpitations, orthopnea, claudication, leg swelling and PND.  Gastrointestinal: Negative for heartburn, nausea, vomiting, blood in stool and melena.  Genitourinary: Negative for dysuria and flank pain.  Musculoskeletal: Negative for joint pain and back pain.  Skin: Negative for rash.  Neurological: Negative for focal weakness, seizures, loss of consciousness, weakness and headaches.  Endo/Heme/Allergies: Negative for abnormal bleeding.    Psychiatric/Behavioral: Negative for memory loss.      PHYSICAL EXAMINATION      Due to this being a TeleHealth evaluation, many elements of the physical examination are unable to be assessed.     General: Well developed, in no acute distress, cooperative and alert  HEENT: Pupils equal/round. No marked JVD visible on video.  Respiratory: No audible wheezing, no signs of respiratory distress, lips non cyanotic  Extremities:  No edema  Neuro: A&Ox3, speech clear, no facial droop, answering questions appropriately  Skin: Skin color is normal. No rashes or lesions. Non diaphoretic on visible skin during exam       DIAGNOSTIC DATA      1. Stress Test   Lexiscan-  01/02/2016- no perfusion defects consistent with prior infarction or current ischemia, EF (55-65%).     2. Echo   12/10/12- EF 60-65%   07/14/14- EF 60%, LAE mild   02/20/18-EF 61 - 65%, mild mitral annular calcification, trace MR, Mild AV sclerosis with no significant stenosis     3. Lipids   06/09/17- TC 199, HDL 52, LDL 113, TG 171   09/30/17- TC 238, HDL 49, LDL 128, TG 307   01/06/18- TC 220, HDL 48, LDL 119, TG 264   05/22/18- TC 164, HDL 55, LDL 81, TG 139   11/05/18- TC 208, HDL 65, LDL 93, TG 250   02/15/19- TC 202, HDL 59, LDL 118, TG 124    4. Calcium Score   10/01/17 -   The coronary calcium in each vessel is as follows:   ??   Left main coronary artery: 0   Left anterior descending coronary artery: 28   Left circumflex coronary artery: 1   Right coronary artery: 0   Posterior descending coronary artery: 0   ??   Total calcium score: 29       LABORATORY DATA      Lab Results   Component Value Date/Time    WBC 6.5 06/09/2017 09:33 AM    HGB 14.6 06/09/2017 09:33 AM    HCT 42.4 06/09/2017 09:33 AM    PLATELET 289 06/09/2017 09:33 AM    MCV 90 06/09/2017 09:33 AM      Lab Results   Component Value Date/Time    Sodium 143 06/09/2017 09:33 AM    Potassium 4.5 06/09/2017 09:33 AM    Chloride 105 06/09/2017 09:33 AM    CO2 24 06/09/2017 09:33 AM    Anion gap 5 09/08/2008 02:40 PM    Glucose 105 (H) 06/09/2017 09:33 AM    BUN 11 06/09/2017 09:33 AM    Creatinine 0.68 06/09/2017 09:33 AM    BUN/Creatinine ratio 16 06/09/2017 09:33 AM    GFR est AA 97 06/09/2017 09:33 AM    GFR est non-AA 84 06/09/2017 09:33 AM    Calcium 9.1 06/09/2017 09:33 AM    Bilirubin, total 0.4 11/05/2018 09:42 AM    Alk. phosphatase 70 11/05/2018 09:42 AM    Protein, total 6.6 11/05/2018 09:42 AM    Albumin 4.6 11/05/2018 09:42 AM    Globulin 3.1 09/08/2008 02:40  PM    A-G Ratio 1.7 06/09/2017 09:33 AM    ALT (SGPT) 29 11/05/2018 09:42 AM             FOLLOW-UP     Follow-up and Dispositions    ?? Return in about 6 months (around 02/13/2020).             Patient was made aware and verbalized understanding that an appointment will be scheduled for them for a virtual visit and/or office visit within the above time frame. Patient understanding his/her responsibility to call and change time/date if he/she so chooses.    Thank you, Eliberto Ivory, MD for allowing me to participate in the care of [place]. Please do not hesitate to contact me for further questions/concerns.     Greater than 20 minutes was spent in direct video patient care, planning and chart review.  This visit was conducted using [employer] telemedicine services.       Vennie Homans, MD    [facility]        [address] [place], [place]  [place], [place]        [phone] / [phone] Fax       [facility]  [place]  [place], [place]  [phone] / [phone] Fax

## 2019-08-13 NOTE — Progress Notes (Signed)
Progress Notes by Vennie Homans, MD at 08/13/19 0940                Author: Vennie Homans, MD  Service: --  Author Type: Physician       Filed: 12/07/19 1649  Encounter Date: 08/13/2019  Status: Addendum          Editor: Vennie Homans, MD (Physician)          Related Notes: Original Note by Vennie Homans, MD (Physician) filed at 08/13/19 1048                            VIRTUAL VISIT DOCUMENTATION        Pursuant to the emergency declaration under the [place], 1135 waiver authority and the Coronavirus Preparedness and Response Supplemental Appropriations Act, this Virtual  Visit was conducted, with patient's consent,  to reduce the patient's risk of exposure to COVID-19 and provide continuity of care for an established patient.       Services were provided through a video synchronous discussion virtually to substitute for in-person clinic visit.        CHIEF COMPLAINT         Leslie Potter is a 79 y.o.  female who was seen by synchronous (real-time) audio-video technology on 08/13/2019.  Patient  is being seen today for shortness of breath.         ASSESSMENT                   ICD-10-CM  ICD-9-CM             1.  Bradycardia   R00.1  427.89       2.  Dyslipidemia   E78.5  272.4       3.  Fatigue, unspecified type   R53.83  780.79             4.  Coronary artery disease involving native coronary artery of native heart without angina pectoris   I25.10  414.01                PLAN     1. Bradycardia.   -We will check her heart rate during the cardiac testing   -May require a 24-hour Holter monitor and will discuss this with her at the time of her cardiac testing      2. Dyslipidemia on Crestor    -Her last LDL was 118 and she is on 5 mg of Crestor.  She is not describing any muscle aching at this time      3. Possible OSA with periods of apnea per her daughter.    - Sleep study was negative.      4.  Shortness of breath/history of moderate asthma as she describes  it   -Unclear if this is anginal equivalent and she is concerned about as well so we will go forward with a Lexiscan Cardiolite and repeat echocardiogram.  Her aortic valve did not demonstrate any stenosis in the past and she was asking about this.  I will  also check a proBNp      Follow-up with me in 6 weeks or as needed      Addendum:   (12/07/2019): Mr. Leslie Potter is a low cardiac operative risk and may proceed with surgery. She has any additional questions please not hesitate to call. Of note he had a recent echocardiogram and stress test that  were both normal.      Vennie Homans, MD            We discussed the expected course, resolution and complications of the diagnosis(es) in detail.  Medication risks, benefits, costs, interactions, and alternatives were discussed as indicated.  I  advised her to contact the office if her condition worsens, changes or fails to improve as anticipated. She expressed understanding with the diagnosis(es) and plan        HISTORY OF PRESENTING ILLNESS         Leslie Potter is a 79 y.o.  female  with hypercholesterolemia and family hx of MI, dyslipidemia last seen by me 4 months ago.      Cardiac risk factors: dyslipidemia, obesity, post-menopausal, family hx   I have personally obtained the history from the patient.        HISTORY OF PRESENTING ILLNESS        Leslie Potter states that over the last year she is continue to have difficulty with shortness of breath with activity.  She says it has been about a year ongoing and has been  seeing pulmonary.  She had pulmonary test done 29 January and it did show some progression of disease but nothing of significance.  They placed her on Spiriva which she is not sure if it is really helped a lot.  The other concern she has is that every  time she becomes active her heart rate will go up into the 109 range and sometimes up into the 80s which is concerning to her.  She has had one episode where she was in the shower and felt dizzy  like she was going to pass out.  She did go to urgent care  center March 1 because of some of the symptoms chest x-ray she said demonstrated no significant abnormalities.  She did have a CT of her chest in the past with less than 4 mm nodules noted and there was no recommendation follow-up at that time.  She does  use her Lasix and notes some edema at times.  She is has gained about 8 pounds over Covid.  She does also say that she is described as having moderate asthma.         ACTIVE PROBLEM LIST          Patient Active Problem List           Diagnosis  Date Noted         ?  Small airways disease  10/06/2015     ?  Hypothyroidism  05/08/2015     ?  Pure hypercholesterolemia  05/08/2015     ?  History of sinus bradycardia  05/08/2015     ?  Family history of heart attack  05/08/2015     ?  History of prediabetes  05/08/2015     ?  Hx of diverticulitis of colon  05/08/2015     ?  History of cholecystectomy  05/08/2015         ?  Dysthymia  05/08/2015                  PAST MEDICAL HISTORY          Past Medical History:        Diagnosis  Date         ?  Arthritis            hands/knees         ?  Asthma       ?  Asthma       ?  CAD (coronary artery disease)       ?  Chronic obstructive pulmonary disease (HCC)       ?  Diabetes ([place])       ?  Dysthymia  05/08/2015     ?  Family history of heart attack  05/08/2015     ?  GERD (gastroesophageal reflux disease)       ?  Headache       ?  High cholesterol       ?  History of cholecystectomy  05/08/2015     ?  History of prediabetes  05/08/2015     ?  History of sinus bradycardia  05/08/2015     ?  Hx of diverticulitis of colon  05/08/2015     ?  Hyperlipidemia       ?  Hypothyroidism  05/08/2015     ?  Joint pain       ?  Memory disorder       ?  Pre-diabetes       ?  Pure hypercholesterolemia  05/08/2015     ?  Seasonal allergic reaction       ?  Small airways disease  10/06/2015     ?  Thyroid disease           ?  Thyroid disease                    PAST SURGICAL HISTORY           Past Surgical History:         Procedure  Laterality  Date          ?  COLONOSCOPY  N/A  04/22/2017          COLONOSCOPY performed by Eugenie Filler., MD at [facility]          ?  COLORECTAL SCRN; HI RISK IND    04/22/2017                     ?  HX CATARACT REMOVAL  Bilateral  2010     ?  HX CHOLECYSTECTOMY    2016     ?  HX OOPHORECTOMY  Bilateral  2017     ?  HX OTHER SURGICAL    2012          colon polyp removed          ?  HX OTHER SURGICAL              ovarian cyst removed          ?  HX RETINAL DETACHMENT REPAIR  Right  2008          tears          ?  HX TUBAL LIGATION         ?  UPPER GI ENDOSCOPY,BIOPSY    01/06/2015                            ALLERGIES          Allergies        Allergen  Reactions         ?  Simvastatin  Myalgia                 FAMILY HISTORY  Family History         Problem  Relation  Age of Onset          ?  Heart Disease  Mother       ?  Heart Disease  Father       ?  Heart Disease  Sister       ?  Colon Cancer  Sister            ?  Heart Disease  Brother        negative for cardiac disease            SOCIAL HISTORY          Social History          Socioeconomic History         ?  Marital status:  DIVORCED              Spouse name:  Not on file         ?  Number of children:  Not on file     ?  Years of education:  Not on file     ?  Highest education level:  Not on file       Tobacco Use         ?  Smoking status:  Never Smoker     ?  Smokeless tobacco:  Never Used       Substance and Sexual Activity         ?  Alcohol use:  No     ?  Drug use:  No     ?  Sexual activity:  Never        Other Topics  Concern                MEDICATIONS          Current Outpatient Medications        Medication  Sig         ?  rosuvastatin (Crestor) 5 mg tablet  Take 1 Tab by mouth nightly.     ?  levothyroxine (SYNTHROID) 112 mcg tablet  Take 112 mcg by mouth.     ?  cholecalciferol (VITAMIN D3) 2,000 unit cap capsule  Vitamin D3 2,000 unit capsule    Take by oral route.     ?   mirabegron ER (MYRBETRIQ) 50 mg ER tablet  Myrbetriq 50 mg tablet,extended release    Take 1 tablet every day by oral route for 31 days.     ?  diclofenac (VOLTAREN) 1 % gel  Apply two grams by topical route four times daily to the affected area (s).     ?  desmopressin (DDAVP) 0.1 mg tablet  DDAVP 0.1 mg tablet    take one PO qhs     ?  cephALEXin (KEFLEX) 500 mg capsule  TAKE 1 CAPSULE BY MOUTH EVERY 12 HOURS FOR 7 DAYS     ?  vitamin E (AQUA GEMS) 400 unit capsule  vitamin E     ?  co-enzyme Q-10 (CO Q-10) 100 mg capsule  Take 100 mg by mouth daily.     ?  magnesium 250 mg tab  Take  by mouth.         ?  terbinafine HCl (LAMISIL) 250 mg tablet  Take 250 mg by mouth daily.         ?  traZODone (DESYREL) 50 mg tablet  Take  by mouth nightly.     ?  docusate sodium (COLACE) 100 mg capsule  Take 100 mg by mouth as needed for Constipation.     ?  levothyroxine (SYNTHROID) 100 mcg tablet  Take 112 mcg by mouth Daily (before breakfast).     ?  OTHER  Allergy shots - weekly.     ?  furosemide (LASIX) 20 mg tablet  Take 20 mg by mouth as needed.     ?  cholecalciferol (VITAMIN D3) 5,000 unit capsule  Take 1 Cap by mouth daily.     ?  VENTOLIN HFA 90 mcg/actuation inhaler  INHALE TWO PUFFS BY MOUTH EVERY 4 HOURS AS NEEDED FOR  WHEEZING     ?  Omega-3 Fatty Acids (FISH OIL) 500 mg cap  Take  by mouth as needed.     ?  MONTELUKAST SODIUM (SINGULAIR PO)  Take  by mouth daily.     ?  fexofenadine (ALLEGRA) 180 mg tablet  Take  by mouth daily.     ?  liothyronine (CYTOMEL) 5 mcg tablet  Take 1 Tab by mouth daily.     ?  omeprazole (PRILOSEC) 40 mg capsule  Take 1 Cap by mouth daily. Indications: GASTROESOPHAGEAL REFLUX     ?  ascorbic acid (VITAMIN C) 1,000 mg tablet  Take 1,000 mg by mouth as needed.         ?  calcium-magnesium-zinc Tab  Take 1,000 mg by mouth as needed.          No current facility-administered medications for this visit.            I have reviewed the nurses notes, vitals, problem list, allergy list,  medical history, family, social history and medications.            REVIEW OF SYMPTOMS        Constitutional: Negative for fever, chills, malaise/fatigue and diaphoresis.    Respiratory: Negative for cough, hemoptysis, sputum production, shortness of breath and wheezing.    Cardiovascular: Negative for chest pain, palpitations, orthopnea, claudication, leg swelling and PND.   Gastrointestinal: Negative for heartburn, nausea, vomiting, blood in stool and melena.   Genitourinary: Negative for dysuria and flank pain.   Musculoskeletal: Negative for joint pain and back pain.   Skin: Negative for rash.   Neurological: Negative for focal weakness, seizures, loss of consciousness, weakness and headaches.   Endo/Heme/Allergies: Negative for abnormal bleeding.     Psychiatric/Behavioral: Negative for memory loss.          PHYSICAL EXAMINATION         Due to this being a TeleHealth evaluation, many elements of the physical examination are unable to be assessed.       General: Well developed, in no acute distress, cooperative and alert   HEENT: Pupils equal/round. No marked JVD visible on video.   Respiratory: No audible wheezing, no signs of respiratory distress, lips non cyanotic   Extremities:  No edema   Neuro: A&Ox3, speech clear, no facial droop, answering questions appropriately   Skin: Skin color is normal. No rashes or lesions. Non diaphoretic on visible skin during exam            DIAGNOSTIC DATA         1. Stress Test    Lexiscan- 01/02/2016- no perfusion defects consistent with prior infarction or current ischemia, EF (55-65%).       2. Echo    12/10/12- EF 60-65%    07/14/14- EF 60%, LAE mild    02/20/18-EF 61 -  65%, mild mitral annular calcification, trace MR, Mild AV sclerosis with no significant stenosis       3. Lipids    06/09/17- TC 199, HDL 52, LDL 113, TG 171    09/30/17- TC 238, HDL 49, LDL 128, TG 307    01/06/18- TC 220, HDL 48, LDL 119, TG 264    05/22/18- TC 164, HDL 55, LDL 81, TG 139    11/05/18- TC 208, HDL  65, LDL 93, TG 250    02/15/19- TC 202, HDL 59, LDL 118, TG 124      4. Calcium Score    10/01/17 -    The coronary calcium in each vessel is as follows:    ??    Left main coronary artery: 0    Left anterior descending coronary artery: 28    Left circumflex coronary artery: 1    Right coronary artery: 0    Posterior descending coronary artery: 0    ??    Total calcium score: 29            LABORATORY DATA           Lab Results         Component  Value  Date/Time            WBC  6.5  06/09/2017 09:33 AM       HGB  14.6  06/09/2017 09:33 AM       HCT  42.4  06/09/2017 09:33 AM       PLATELET  289  06/09/2017 09:33 AM            MCV  90  06/09/2017 09:33 AM           Lab Results         Component  Value  Date/Time            Sodium  143  06/09/2017 09:33 AM       Potassium  4.5  06/09/2017 09:33 AM       Chloride  105  06/09/2017 09:33 AM       CO2  24  06/09/2017 09:33 AM       Anion gap  5  09/08/2008 02:40 PM       Glucose  105 (H)  06/09/2017 09:33 AM       BUN  11  06/09/2017 09:33 AM       Creatinine  0.68  06/09/2017 09:33 AM       BUN/Creatinine ratio  16  06/09/2017 09:33 AM       GFR est AA  97  06/09/2017 09:33 AM       GFR est non-AA  84  06/09/2017 09:33 AM       Calcium  9.1  06/09/2017 09:33 AM       Bilirubin, total  0.4  11/05/2018 09:42 AM       Alk. phosphatase  70  11/05/2018 09:42 AM       Protein, total  6.6  11/05/2018 09:42 AM       Albumin  4.6  11/05/2018 09:42 AM       Globulin  3.1  09/08/2008 02:40 PM       A-G Ratio  1.7  06/09/2017 09:33 AM            ALT (SGPT)  29  11/05/2018 09:42 AM  FOLLOW-UP          Follow-up and Dispositions      ??  Return in about 6 months (around 02/13/2020).                    Patient was made aware and verbalized understanding that an appointment will be scheduled for them for a virtual visit and/or office visit within the above time frame. Patient understanding his/her responsibility to call and change time/date if he/she  so chooses.      Thank  you, Eliberto Ivory, MD for allowing me to participate in the care of [place]. Please do not hesitate to contact me for further questions/concerns.       Greater than 20 minutes was spent in direct video patient care, planning and chart review.   This visit was conducted using [employer] telemedicine services.          Vennie Homans, MD      [facility]         [address] [place], [place]   [place], [place]         [phone] / [phone] Fax         [facility]   [place]   [place], [place]   [phone] / [phone] Fax

## 2019-08-13 NOTE — Telephone Encounter (Signed)
LM for pt to call back and get checked in for his VV

## 2019-09-03 ENCOUNTER — Ambulatory Visit

## 2019-09-03 ENCOUNTER — Encounter

## 2019-09-03 ENCOUNTER — Ambulatory Visit: Admit: 2019-09-03 | Discharge: 2019-09-03 | Payer: MEDICARE | Primary: Internal Medicine

## 2019-09-03 DIAGNOSIS — R001 Bradycardia, unspecified: Secondary | ICD-10-CM

## 2019-09-03 LAB — NUCLEAR CARDIAC STRESS TEST
Baseline Diastolic BP: 88 mmHg
Baseline HR: 56 {beats}/min
Baseline O2 Sat: 100 %
Baseline Systolic BP: 154 mmHg
Stress Diastolic BP: 88 mmHg
Stress O2 Sat: 98 %
Stress Peak HR: 82 {beats}/min
Stress Percent HR Achieved: 59 %
Stress Rate Pressure Product: 12628 bpm*mmHg
Stress ST Depression: 0 mm
Stress ST Elevation: 0 mm
Stress Systolic BP: 154 mmHg
Stress Target HR: 140 {beats}/min

## 2019-09-03 LAB — NM STRESS TEST WITH MYOCARDIAL PERFUSION
Baseline Diastolic BP: 88 mmHg
Baseline HR: 56 {beats}/min
Baseline O2 Sat: 100 %
Baseline Systolic BP: 154 mmHg
Left Ventricular Ejection Fraction: 74
Stress Diastolic BP: 88 mmHg
Stress O2 Sat: 98 %
Stress Peak HR: 82 {beats}/min
Stress Percent HR Achieved: 59 %
Stress Rate Pressure Product: 12628 bpm*mmHg
Stress ST Depression: 0 mm
Stress ST Elevation: 0 mm
Stress Systolic BP: 154 mmHg
Stress Target HR: 140 {beats}/min

## 2019-09-03 MED ORDER — TECHNETIUM TC-99M SESTAMIBI (CARDIOLITE) INJECTION WITH DILUTION KIT
Freq: Once | INTRAVENOUS | Status: AC
Start: 2019-09-03 — End: 2019-09-03
  Administered 2019-09-03: 14:00:00 via INTRAVENOUS

## 2019-09-03 MED ORDER — REGADENOSON 0.4 MG/5 ML IV SYRINGE
0.4 mg/5 mL | Freq: Once | INTRAVENOUS | Status: AC
Start: 2019-09-03 — End: 2019-09-03
  Administered 2019-09-03: 14:00:00 via INTRAVENOUS

## 2019-09-03 MED ORDER — TECHNETIUM TC-99M SESTAMIBI (CARDIOLITE) INJECTION WITH DILUTION KIT
Freq: Once | INTRAVENOUS | Status: AC
Start: 2019-09-03 — End: 2019-09-03
  Administered 2019-09-03: 12:00:00 via INTRAVENOUS

## 2019-09-03 NOTE — Progress Notes (Signed)
Informed pt of results per Dr Doloresco

## 2019-09-03 NOTE — Progress Notes (Signed)
Your BNP, which is an indicator of heart failure, is normal and this is good news

## 2019-09-04 LAB — NT-PRO BNP: NT pro-BNP: 180 PG/ML (ref <450)

## 2019-09-04 LAB — PROBNP, N-TERMINAL: BNP: 180 PG/ML (ref <450)

## 2019-09-06 ENCOUNTER — Ambulatory Visit

## 2019-09-06 ENCOUNTER — Ambulatory Visit: Admit: 2019-09-06 | Discharge: 2019-09-06 | Payer: MEDICARE | Primary: Internal Medicine

## 2019-09-06 DIAGNOSIS — R001 Bradycardia, unspecified: Secondary | ICD-10-CM

## 2019-09-06 NOTE — Progress Notes (Signed)
Informed pt of results

## 2019-09-08 LAB — ECHO ADULT COMPLETE
AV Area by Peak Velocity: 2.35 cm2
AV Area by VTI: 2.68 cm2
AV Mean Gradient: 3.77 mmHg
AV Peak Gradient: 7.66 mmHg
AV Peak Velocity: 138.39 cm/s
AV VTI: 27.38 cm
AVA/BSA Peak Velocity: 1.3 cm2/m2
AVA/BSA VTI: 1.5 cm2/m2
Aortic Root: 3.57 cm
Ascending Aorta: 3.73 cm
E/E' Lateral: 11.04
E/E' Ratio (Averaged): 11.04
E/E' Septal: 11.04
EF BP: 66.2 percent (ref 55.0–100.0)
Est. RA Pressure: 3 mmHg
IVSd: 0.93 cm — AB (ref 0.60–0.90)
LA Area 4C: 16.54 cm2
LA Major Axis: 3.5 cm
LA Minor Axis: 1.96 cm
LA Volume 2C: 46.99 mL (ref 22.00–52.00)
LA Volume 4C: 47.38 mL (ref 22.00–52.00)
LA Volume BP: 51.97 mL (ref 22.0–52.0)
LA Volume Index 2C: 26.37 ml/m2 (ref 16–28)
LA Volume Index 4C: 26.59 ml/m2 (ref 16–28)
LA Volume Index BP: 29.16 ml/m2 (ref 16–28)
LV E' Lateral Velocity: 6.02 cm/s
LV E' Septal Velocity: 6.02 cm/s
LV EDV A2C: 75.26 mL
LV EDV A4C: 62.01 mL
LV EDV BP: 68.52 mL (ref 56.0–104.0)
LV EDV Index A2C: 42.2 mL/m2
LV EDV Index A4C: 34.8 mL/m2
LV EDV Index BP: 38.5 mL/m2
LV ESV A2C: 20.99 mL
LV ESV A4C: 22.5 mL
LV ESV BP: 23.14 mL (ref 19.0–49.0)
LV ESV Index A2C: 11.8 mL/m2
LV ESV Index A4C: 12.6 mL/m2
LV ESV Index BP: 13 mL/m2
LV Ejection Fraction A2C: 72 percent
LV Ejection Fraction A4C: 64 percent
LV Mass 2D Index: 62.8 g/m2 (ref 43–95)
LV Mass 2D: 111.9 g (ref 67–162)
LVIDd: 4.05 cm (ref 3.90–5.30)
LVIDs: 2.56 cm
LVOT Diameter: 1.95 cm
LVOT Peak Gradient: 4.78 mmHg
LVOT Peak Velocity: 109.31 cm/s
LVOT SV: 73.4 mL
LVOT VTI: 24.69 cm
LVPWd: 0.87 cm (ref 0.60–0.90)
MV A Velocity: 95.6 cm/s
MV E Velocity: 66.45 cm/s
MV E Wave Deceleration Time: 264.77 ms
MV E/A: 0.7
MV Max Velocity: 108.41 cm/s
MV Mean Gradient: 1.11 mmHg
MV PHT: 76.78 ms
MV Peak Gradient: 4.7 mmHg
MV VTI: 33.93 cm
RA Area 4C: 16.88 cm2
RVSP: 28.86 mmHg
TAPSE: 1.81 cm (ref 1.50–2.00)
TR Max Velocity: 254.25 cm/s
TR Peak Gradient: 25.86 mmHg

## 2019-09-08 LAB — TRANSTHORACIC ECHOCARDIOGRAM (TTE) COMPLETE (CONTRAST/BUBBLE/3D PRN)
AV Area by Peak Velocity: 2.35 cm2
AV Area by VTI: 2.68 cm2
AV Mean Gradient: 3.77 mmHg
AV Peak Gradient: 7.66 mmHg
AV Peak Velocity: 138.39 cm/s
AV VTI: 27.38 cm
AVA/BSA Peak Velocity: 1.3 cm2/m2
AVA/BSA VTI: 1.5 cm2/m2
Aortic Root: 3.57 cm
Ascending Aorta: 3.73 cm
E/E' Lateral: 11.04
E/E' Ratio (Averaged): 11.04
E/E' Septal: 11.04
EF BP: 66.2 percent (ref 55–100)
Est. RA Pressure: 3 mmHg
IVSd: 0.93 cm — AB (ref 0.6–0.9)
LA Area 4C: 16.54 cm2
LA Major Axis: 3.5 cm
LA Minor Axis: 1.96 cm
LA Volume 2C: 46.99 mL (ref 22–52)
LA Volume 4C: 47.38 mL (ref 22–52)
LA Volume BP: 51.97 mL (ref 22–52)
LA Volume Index 2C: 26.37 ml/m2 (ref 16–28)
LA Volume Index 4C: 26.59 ml/m2 (ref 16–28)
LA Volume Index BP: 29.16 ml/m2 (ref 16–28)
LV E' Lateral Velocity: 6.02 cm/s
LV E' Septal Velocity: 6.02 cm/s
LV EDV A2C: 75.26 mL
LV EDV A4C: 62.01 mL
LV EDV BP: 68.52 mL (ref 56–104)
LV EDV Index A2C: 42.2 mL/m2
LV EDV Index A4C: 34.8 mL/m2
LV EDV Index BP: 38.5 mL/m2
LV ESV A2C: 20.99 mL
LV ESV A4C: 22.5 mL
LV ESV BP: 23.14 mL (ref 19–49)
LV ESV Index A2C: 11.8 mL/m2
LV ESV Index A4C: 12.6 mL/m2
LV ESV Index BP: 13 mL/m2
LV Ejection Fraction A2C: 72 percent
LV Ejection Fraction A4C: 64 percent
LV Mass 2D Index: 62.8 g/m2 (ref 43–95)
LV Mass 2D: 111.9 g (ref 67–162)
LVIDd: 4.05 cm (ref 3.9–5.3)
LVIDs: 2.56 cm
LVOT Diameter: 1.95 cm
LVOT Peak Gradient: 4.78 mmHg
LVOT Peak Velocity: 109.31 cm/s
LVOT SV: 73.4 mL
LVOT VTI: 24.69 cm
LVPWd: 0.87 cm (ref 0.6–0.9)
Left Ventricular Ejection Fraction: 66
MV A Velocity: 95.6 cm/s
MV E Velocity: 66.45 cm/s
MV E Wave Deceleration Time: 264.77 ms
MV E/A: 0.7
MV Max Velocity: 108.41 cm/s
MV Mean Gradient: 1.11 mmHg
MV PHT: 76.78 ms
MV Peak Gradient: 4.7 mmHg
MV VTI: 33.93 cm
RA Area 4C: 16.88 cm2
RVSP: 28.86 mmHg
TAPSE: 1.81 cm (ref 1.5–2)
TR Max Velocity: 254.25 cm/s
TR Peak Gradient: 25.86 mmHg

## 2019-09-14 MED ORDER — ROSUVASTATIN 5 MG TAB
5 mg | ORAL_TABLET | ORAL | 0 refills | Status: DC
Start: 2019-09-14 — End: 2020-01-13

## 2019-09-22 ENCOUNTER — Encounter

## 2019-11-29 NOTE — Progress Notes (Signed)
Lipids are at goal. No action needed. I would like the cholesterol checked again in 6 mo.Continue to eat healthy and clean.

## 2019-11-30 LAB — SPECIMEN STATUS REPORT

## 2019-11-30 LAB — LIPID PANEL
Cholesterol, Total: 180 mg/dL (ref 100–199)
Cholesterol, total: 180 mg/dL (ref 100–199)
HDL Cholesterol: 58 mg/dL (ref >39)
HDL: 58 mg/dL (ref >39)
LDL Calculated: 92 mg/dL (ref 0–99)
LDL, calculated: 92 mg/dL (ref 0–99)
Triglyceride: 177 mg/dL — ABNORMAL HIGH (ref 0–149)
Triglycerides: 177 mg/dL — ABNORMAL HIGH (ref 0–149)
VLDL, calculated: 30 mg/dL (ref 5–40)
VLDL: 30 mg/dL (ref 5–40)

## 2019-11-30 LAB — HEPATIC FUNCTION PANEL
ALT (SGPT): 21 IU/L (ref 0–32)
ALT: 21 IU/L (ref 0–32)
AST (SGOT): 20 IU/L (ref 0–40)
AST: 20 IU/L (ref 0–40)
Albumin: 4.5 g/dL (ref 3.7–4.7)
Albumin: 4.5 g/dL (ref 3.7–4.7)
Alk. phosphatase: 85 IU/L (ref 48–121)
Alkaline Phosphatase: 85 IU/L (ref 48–121)
Bilirubin, Direct: 0.13 mg/dL (ref 0.00–0.40)
Bilirubin, direct: 0.13 mg/dL (ref 0.00–0.40)
Bilirubin, total: 0.4 mg/dL (ref 0.0–1.2)
Protein, total: 6.9 g/dL (ref 6.0–8.5)
Total Bilirubin: 0.4 mg/dL (ref 0.0–1.2)
Total Protein: 6.9 g/dL (ref 6.0–8.5)

## 2019-11-30 LAB — CVD REPORT

## 2019-12-07 NOTE — Telephone Encounter (Signed)
Pt last seen VV 07/2019. Recent stress and echo normal. No blood thinners. Dx brady/cholesterol.

## 2019-12-22 ENCOUNTER — Encounter

## 2020-01-13 MED ORDER — ROSUVASTATIN 5 MG TAB
5 mg | ORAL_TABLET | Freq: Every evening | ORAL | 1 refills | Status: AC
Start: 2020-01-13 — End: ?

## 2020-01-13 NOTE — Telephone Encounter (Signed)
rx approved by Dr Doloresco

## 2020-07-04 ENCOUNTER — Ambulatory Visit (INDEPENDENT_AMBULATORY_CARE_PROVIDER_SITE_OTHER): Payer: Medicare Other | Admitting: Cardiology

## 2020-07-04 ENCOUNTER — Encounter: Payer: Self-pay | Admitting: Cardiology

## 2020-07-04 VITALS — BP 108/82 | HR 76 | Ht 63.0 in | Wt 173.0 lb

## 2020-07-04 DIAGNOSIS — I2510 Atherosclerotic heart disease of native coronary artery without angina pectoris: Secondary | ICD-10-CM | POA: Diagnosis not present

## 2020-07-04 DIAGNOSIS — R001 Bradycardia, unspecified: Secondary | ICD-10-CM | POA: Diagnosis not present

## 2020-07-04 MED ORDER — ROSUVASTATIN CALCIUM 5 MG PO TABS: 5.0000 mg | ORAL_TABLET | ORAL | 6 refills | Status: DC

## 2020-07-04 NOTE — Patient Instructions (Signed)
Medication Instructions:   Your physician has recommended you make the following change in your medication:   Take crestor 5 mg three times per week on Monday-Wednesday and Friday  Continue other medications the same  Labwork:  none  Testing/Procedures:  none  Follow-Up:  Your physician recommends that you schedule a follow-up appointment in: 6 months.  Any Other Special Instructions Will Be Listed Below (If Applicable).  If you need a refill on your cardiac medications before your next appointment, please call your pharmacy.

## 2020-07-04 NOTE — Progress Notes (Signed)
Cardiology Office Note  Date: 07/04/2020   ID: Amanda Jennings, DOB 14-Mar-1941, MRN [id number]  PCP:  Pcp, No  Cardiologist:  Nona Dell, MD Electrophysiologist:  None   Chief Complaint  Patient presents with  . Cardiac follow-up    History of Present Illness: Amanda Jennings is an 80 y.o. female last assessed via telehealth encounter in October 2020.  She presents for a routine follow-up visit.  She does not describe any obvious angina symptoms with current level of activity.  She has continued to follow with Dr. Lorella Nimrod, a cardiologist in [place].  I reviewed the interval notes.  Lipid panel in July 2021 revealed LDL 92.  Since that time she stopped Crestor and her numbers have increased significantly.  I talked with her about going back on Crestor 5 mg at least 3 days a week for now.  She underwent a follow-up echocardiogram as well as Lexiscan Cardiolite in April of last year as part of preoperative evaluation prior to left knee replacement.  These studies are resulted below.  She has not yet gotten back to her walking regular walking regimen.  I personally reviewed her ECG today which shows normal sinus rhythm with leftward axis.  Past Medical History:  Diagnosis Date  . Arthritis   . Asthma   . Bradycardia   . Cataracts, bilateral   . Cholelithiasis   . Coronary atherosclerosis of native coronary artery    Mild atherosclerosis  . Depression   . GERD (gastroesophageal reflux disease)   . History of positive PPD   . Hx of migraines   . Hyperlipidemia   . Hypothyroidism   . Osteopenia     Past Surgical History:  Procedure Laterality Date  . Bilateral tubal ligation    . Cataract surgery    . Colonic polyp resection      Current Outpatient Medications  Medication Sig Dispense Refill  . albuterol (PROVENTIL HFA;VENTOLIN HFA) 108 (90 Base) MCG/ACT inhaler Inhale into the lungs every 6 (six) hours as needed for wheezing or shortness of breath.    . Ascorbic  Acid (VITAMIN C ER PO) Take 1 tablet by mouth daily.     . Budesonide-Formoterol Fumarate (SYMBICORT IN) Inhale into the lungs.    . Cholecalciferol (VITAMIN D3) 5000 UNITS CAPS Take 1 capsule by mouth daily.    . Coenzyme Q10 (CO Q 10 PO) Take by mouth daily.     . fexofenadine (ALLEGRA) 180 MG tablet Take 180 mg by mouth daily.    . furosemide (LASIX) 20 MG tablet TAKE 1 TABLET BY MOUTH AS NEEDED FOR  LEG  SWELLING 30 tablet 6  . levothyroxine (SYNTHROID) 100 MCG tablet Take 100 mcg by mouth daily before breakfast.    . liothyronine (CYTOMEL) 5 MCG tablet Take 5 mcg by mouth daily.    . montelukast (SINGULAIR) 10 MG tablet Take 10 mg by mouth every morning.     . rosuvastatin (CRESTOR) 5 MG tablet Take 5 mg by mouth daily.    Marland Kitchen UNKNOWN TO PATIENT 2 time weekly allergy injection - doesn't know the name or dose     No current facility-administered medications for this visit.   Allergies:  Ciprofloxacin   ROS: Left knee stiffness.  Physical Exam: VS:  BP 108/82   Pulse 76   Ht 5\' 3"  (1.6 m)   Wt 173 lb (78.5 kg)   SpO2 98%   BMI 30.65 kg/m , BMI Body mass index is 30.65 kg/m.  Wt  Readings from Last 3 Encounters:  07/04/20 173 lb (78.5 kg)  03/15/19 160 lb (72.6 kg)  03/03/18 160 lb (72.6 kg)    General: Elderly woman, appears comfortable at rest. HEENT: Conjunctiva and lids normal, wearing a mask. Neck: Supple, no elevated JVP or carotid bruits, no thyromegaly. Lungs: Clear to auscultation, nonlabored breathing at rest. Cardiac: Regular rate and rhythm, no S3 or significant systolic murmur, no pericardial rub. Extremities: No pitting edema.  ECG:  An ECG dated 03/03/2018 was personally reviewed today and demonstrated:  Sinus bradycardia.  Recent Labwork:  July 2021: Cholesterol 180, triglycerides 177, HDL 58, LDL 92  Other Studies Reviewed Today:  Lexiscan Cardiolite 09/03/2019 [facility]):  SPECT: Left ventricular function post-stress was normal. Calculated  ejection  fraction is 74%.   Baseline ECG: Normal sinus rhythm.   Stress test: Negative stress test.   SPECT: Left ventricular perfusion is normal. Myocardial perfusion  imaging supports a low risk stress test.   Echocardiogram 09/08/2019 [facility]):  LV: Calculated LVEF is 66%. Biplane method used to measure ejection  fraction. Normal cavity size and systolic function (ejection fraction  normal). Upper normal wall thickness. Mild (grade 1) left ventricular  diastolic dysfunction.   AO: Mild sinuses of Valsalva and ascending aorta dilatation. Ascending  aorta diameter = 3.7 cm.   PA: Pulmonary arterial systolic pressure is 29 mmHg.  Assessment and Plan:  1.  History of mild coronary atherosclerosis.  She does not report any active angina at this time and did undergo interval ischemic testing via a Lexiscan Cardiolite in [place] back in April 2021, low risk study.  I reviewed her ECG today.  Would continue observation, recommended resumption of regular Crestor use at least 3 days a week to start.  2.  History of sinus bradycardia, heart rate is normal today.  She is not on any AV nodal blockers at this time.  Medication Adjustments/Labs and Tests Ordered: Current medicines are reviewed at length with the patient today.  Concerns regarding medicines are outlined above.   Tests Ordered: Orders Placed This Encounter  Procedures  . EKG 12-Lead    Medication Changes: No orders of the defined types were placed in this encounter.   Disposition:  Follow up 1 year in the [place] office.  Signed, Jonelle Sidle, MD, [facility] 07/04/2020 1:11 PM    [facility] at [facility] [address] [place], [place], [place] [address] Phone: [phone]; Fax: [phone]

## 2020-08-29 ENCOUNTER — Inpatient Hospital Stay: Admit: 2020-08-29 | Payer: MEDICARE | Primary: Internal Medicine

## 2020-08-29 ENCOUNTER — Encounter

## 2020-08-29 DIAGNOSIS — J45909 Unspecified asthma, uncomplicated: Secondary | ICD-10-CM

## 2021-01-11 ENCOUNTER — Telehealth: Payer: Self-pay | Admitting: Cardiology

## 2021-01-11 NOTE — Telephone Encounter (Signed)
  Patient Consent for Virtual Visit        Amanda Jennings has provided verbal consent on 01/11/2021 for a virtual visit (video or telephone).   CONSENT FOR VIRTUAL VISIT FOR:  Amanda Jennings  By participating in this virtual visit I agree to the following:  I hereby voluntarily request, consent and authorize [facility] and its employed or contracted physicians, physician assistants, nurse practitioners or other licensed health care professionals (the Practitioner), to provide me with telemedicine health care services (the "Services") as deemed necessary by the treating Practitioner. I acknowledge and consent to receive the Services by the Practitioner via telemedicine. I understand that the telemedicine visit will involve communicating with the Practitioner through live audiovisual communication technology and the disclosure of certain medical information by electronic transmission. I acknowledge that I have been given the opportunity to request an in-person assessment or other available alternative prior to the telemedicine visit and am voluntarily participating in the telemedicine visit.  I understand that I have the right to withhold or withdraw my consent to the use of telemedicine in the course of my care at any time, without affecting my right to future care or treatment, and that the Practitioner or I may terminate the telemedicine visit at any time. I understand that I have the right to inspect all information obtained and/or recorded in the course of the telemedicine visit and may receive copies of available information for a reasonable fee.  I understand that some of the potential risks of receiving the Services via telemedicine include:  Delay or interruption in medical evaluation due to technological equipment failure or disruption; Information transmitted may not be sufficient (e.g. poor resolution of images) to allow for appropriate medical decision making by the Practitioner;  and/or  In rare instances, security protocols could fail, causing a breach of personal health information.  Furthermore, I acknowledge that it is my responsibility to provide information about my medical history, conditions and care that is complete and accurate to the best of my ability. I acknowledge that Practitioner's advice, recommendations, and/or decision may be based on factors not within their control, such as incomplete or inaccurate data provided by me or distortions of diagnostic images or specimens that may result from electronic transmissions. I understand that the practice of medicine is not an exact science and that Practitioner makes no warranties or guarantees regarding treatment outcomes. I acknowledge that a copy of this consent can be made available to me via my patient portal [facility] MyChart), or I can request a printed copy by calling the office of [facility].    I understand that my insurance will be billed for this visit.   I have read or had this consent read to me. I understand the contents of this consent, which adequately explains the benefits and risks of the Services being provided via telemedicine.  I have been provided ample opportunity to ask questions regarding this consent and the Services and have had my questions answered to my satisfaction. I give my informed consent for the services to be provided through the use of telemedicine in my medical care

## 2021-01-12 ENCOUNTER — Encounter: Payer: Self-pay | Admitting: Cardiology

## 2021-01-12 ENCOUNTER — Telehealth (INDEPENDENT_AMBULATORY_CARE_PROVIDER_SITE_OTHER): Payer: Medicare Other | Admitting: Cardiology

## 2021-01-12 ENCOUNTER — Other Ambulatory Visit: Payer: Self-pay

## 2021-01-12 VITALS — BP 121/75 | HR 55 | Ht 63.0 in | Wt 168.0 lb

## 2021-01-12 DIAGNOSIS — R0609 Other forms of dyspnea: Secondary | ICD-10-CM

## 2021-01-12 DIAGNOSIS — R0600 Dyspnea, unspecified: Secondary | ICD-10-CM | POA: Diagnosis not present

## 2021-01-12 DIAGNOSIS — I2510 Atherosclerotic heart disease of native coronary artery without angina pectoris: Secondary | ICD-10-CM

## 2021-01-12 NOTE — Patient Instructions (Addendum)
Medication Instructions:  Your physician recommends that you continue on your current medications as directed. Please refer to the Current Medication list given to you today.   Labwork: none  Testing/Procedures: none  Follow-Up:  Your physician recommends that you schedule a follow-up appointment in: 3-4 weeks  Any Other Special Instructions Will Be Listed Below (If Applicable).  If you need a refill on your cardiac medications before your next appointment, please call your pharmacy.  

## 2021-01-12 NOTE — Progress Notes (Signed)
Virtual Visit via Telephone Note   This visit type was conducted due to national recommendations for restrictions regarding the COVID-19 Pandemic (e.g. social distancing) in an effort to limit this patient's exposure and mitigate transmission in our community.  Due to her co-morbid illnesses, this patient is at least at moderate risk for complications without adequate follow up.  This format is felt to be most appropriate for this patient at this time.  The patient did not have access to video technology/had technical difficulties with video requiring transitioning to audio format only (telephone).  All issues noted in this document were discussed and addressed.  No physical exam could be performed with this format.  Please refer to the patient's chart for her  consent to telehealth for [facility].    Date:  01/12/2021   ID:  Amanda Jennings, DOB [birth year], MRN [id number] The patient was identified using 2 identifiers.  Patient Location: Home Provider Location: Office/Clinic   PCP:  [name], MD   [facility] Providers Cardiologist:  Nona Dell, MD {   Evaluation Performed:  Follow-Up Visit  Chief Complaint: Cardiac follow-up  History of Present Illness:    Amanda Jennings is an 80 y.o. female last seen in February.  We spoke by phone today.  She states that she was exposed to COVID-19 over the weekend and recently tested positive.  Just reporting a cough at this time, no fevers or chills or other major symptoms.  She is quarantining at home right now.  Otherwise, she states that she has felt more short of breath with activity over the last year, no chest pain but a sense of breathlessness and has to rest for symptoms to resolve.  She does not report any resting shortness of breath or orthopnea.  I reviewed her current medications.  She states that she stopped Crestor complaining of leg pain, she was only on 5 mg 3 times a week.  Past Medical History:  Diagnosis  Date   Arthritis    Asthma    Bradycardia    Cataracts, bilateral    Cholelithiasis    Coronary atherosclerosis of native coronary artery    Mild atherosclerosis   Depression    GERD (gastroesophageal reflux disease)    History of positive PPD    Hx of migraines    Hyperlipidemia    Hypothyroidism    Osteopenia    Past Surgical History:  Procedure Laterality Date   Bilateral tubal ligation     Cataract surgery     Colonic polyp resection       Current Meds  Medication Sig   acetaminophen (TYLENOL) 500 MG tablet Take 500 mg by mouth every 6 (six) hours as needed.   albuterol (PROVENTIL HFA;VENTOLIN HFA) 108 (90 Base) MCG/ACT inhaler Inhale into the lungs every 6 (six) hours as needed for wheezing or shortness of breath.   Ascorbic Acid (VITAMIN C ER PO) Take 1 tablet by mouth daily.    Budesonide-Formoterol Fumarate (SYMBICORT IN) Inhale into the lungs.   Cholecalciferol (VITAMIN D3) 5000 UNITS CAPS Take 1 capsule by mouth daily.   Coenzyme Q10 (CO Q 10 PO) Take by mouth daily.    fexofenadine (ALLEGRA) 180 MG tablet Take 180 mg by mouth daily.   furosemide (LASIX) 20 MG tablet TAKE 1 TABLET BY MOUTH AS NEEDED FOR  LEG  SWELLING   levothyroxine (SYNTHROID) 112 MCG tablet Take 112 mcg by mouth daily before breakfast.   liothyronine (CYTOMEL) 5 MCG tablet Take  5 mcg by mouth daily.   montelukast (SINGULAIR) 10 MG tablet Take 10 mg by mouth every morning.    Multiple Vitamins-Minerals (PRESERVISION AREDS 2 PO) Take 1 tablet by mouth 2 (two) times daily.   traZODone (DESYREL) 50 MG tablet Take 50 mg by mouth at bedtime.   UNKNOWN TO PATIENT 2 time monthly allergy injection - doesn't know the name or dose     Allergies:   Ciprofloxacin    ROS:   Please see the history of present illness.    All other systems reviewed and are negative.   Prior CV studies:   The following studies were reviewed today:  Lexiscan Cardiolite 09/03/2019 [facility]):  SPECT: Left ventricular  function post-stress was normal. Calculated  ejection fraction is 74%.   Baseline ECG: Normal sinus rhythm.   Stress test: Negative stress test.   SPECT: Left ventricular perfusion is normal. Myocardial perfusion  imaging supports a low risk stress test.    Echocardiogram 09/08/2019 [facility]):  LV: Calculated LVEF is 66%. Biplane method used to measure ejection  fraction. Normal cavity size and systolic function (ejection fraction  normal). Upper normal wall thickness. Mild (grade 1) left ventricular  diastolic dysfunction.   AO: Mild sinuses of Valsalva and ascending aorta dilatation. Ascending  aorta diameter = 3.7 cm.   PA: Pulmonary arterial systolic pressure is 29 mmHg.  Labs/Other Tests and Data Reviewed:    EKG:  An ECG dated 07/04/2020 was personally reviewed today and demonstrated:  Sinus rhythm with leftward axis.  Recent Labs:  July 2021: Cholesterol 180, triglycerides 177, HDL 58, LDL 92  Wt Readings from Last 3 Encounters:  01/12/21 168 lb (76.2 kg)  07/04/20 173 lb (78.5 kg)  03/15/19 160 lb (72.6 kg)        Objective:    Vital Signs:  BP 121/75   Pulse (!) 55   Ht 5\' 3"  (1.6 m)   Wt 168 lb (76.2 kg)   BMI 29.76 kg/m    Patient spoke in full sentences by phone.  No audible coughing or wheezing.  ASSESSMENT & PLAN:    1.  Fatigue and dyspnea on exertion as discussed above.  She has history of mild coronary atherosclerosis by previous testing.  Follow-up cardiac structural and ischemic evaluation in April of last year was also reassuring.  She states that symptoms are worse over the last year.  Plan to schedule an office visit for examination and discussion of further testing.  May need to consider anatomical evaluation with coronary CTA.  2.  Mixed hyperlipidemia, not tolerating Crestor.  I asked her to allow symptoms to equilibrate back to baseline.  May then consider taking Crestor once a week.  Her last LDL was only 92.   Time:   Today, I have  spent 8 minutes with the patient with telehealth technology discussing the above problems.     Medication Adjustments/Labs and Tests Ordered: Current medicines are reviewed at length with the patient today.  Concerns regarding medicines are outlined above.   Tests Ordered: No orders of the defined types were placed in this encounter.   Medication Changes: No orders of the defined types were placed in this encounter.   Follow Up:  In Person  3 to 4 weeks.  Signed, May, MD  01/12/2021 2:15 PM    [facility]

## 2021-02-01 NOTE — Progress Notes (Signed)
Cardiology Office Note  Date: 02/02/2021   ID: Amanda Jennings, DOB [birth year], MRN [id number]  PCP:  [name], MD  Cardiologist:  Nona Dell, MD Electrophysiologist:  None   Chief Complaint: Exertional fatigue and breathlessness  History of Present Illness: Amanda Jennings is a 80 y.o. female with a history of bradycardia, mild atherosclerosis, HLD, hypothyroidism, osteopenia, cholelithiasis, depression, GERD, asthma, arthritis, peripheral edema, DOE, fatigue.  She was last seen by Dr. Diona Browner on 01/12/2021 with complaint of dyspnea on exertion.  She had been exposed to COVID-19 over the weekend and recently tested positive.  At report a cough that time without fever, chills.  She had felt more short of breath with activity over the prior year.  No anginal symptoms.  Relieved with rest.  No shortness of breath at rest or orthopnea.  She had briefly stopped Crestor due to complaints of leg pain.  Plans were to schedule an office visit for examination and discussion of further testing.  Dr. Diona Browner mentioned may need to consider anatomical evaluation with coronary CT.  She was not tolerating Crestor.  She was asked to allow her symptoms to equilibrate to baseline.  May then consider taking Crestor once a week. Her last LDL was 92  She is here today for continued complaints of exertional fatigue and breathlessness.  She states she can be performing some type of minor labor-intensive tasks such as doing laundry or other activities and becomes short of breath and has to stop what she is doing.  She states she believes there is something wrong but not quite sure what it is.  She denies any anginal symptoms but she has history of mild atherosclerosis.  Her last visit with Dr. Diona Browner he mention she may need to consider evaluation with coronary CT.  She had stopped her Crestor but attempted to start back and started having muscular pain and eventually stop medication again.     Past  Medical History:  Diagnosis Date   Arthritis    Asthma    Bradycardia    Cataracts, bilateral    Cholelithiasis    Coronary atherosclerosis of native coronary artery    Mild atherosclerosis   Depression    GERD (gastroesophageal reflux disease)    History of positive PPD    Hx of migraines    Hyperlipidemia    Hypothyroidism    Osteopenia     Past Surgical History:  Procedure Laterality Date   Bilateral tubal ligation     Cataract surgery     Colonic polyp resection      Current Outpatient Medications  Medication Sig Dispense Refill   acetaminophen (TYLENOL) 500 MG tablet Take 500 mg by mouth every 6 (six) hours as needed.     albuterol (PROVENTIL HFA;VENTOLIN HFA) 108 (90 Base) MCG/ACT inhaler Inhale into the lungs every 6 (six) hours as needed for wheezing or shortness of breath.     Ascorbic Acid (VITAMIN C ER PO) Take 1 tablet by mouth daily.      Budesonide-Formoterol Fumarate (SYMBICORT IN) Inhale into the lungs.     Cholecalciferol (VITAMIN D3) 5000 UNITS CAPS Take 1 capsule by mouth daily.     Coenzyme Q10 (CO Q 10 PO) Take by mouth daily.      fexofenadine (ALLEGRA) 180 MG tablet Take 180 mg by mouth daily.     furosemide (LASIX) 20 MG tablet TAKE 1 TABLET BY MOUTH AS NEEDED FOR  LEG  SWELLING 30 tablet 6   levothyroxine (  SYNTHROID) 112 MCG tablet Take 112 mcg by mouth daily before breakfast.     liothyronine (CYTOMEL) 5 MCG tablet Take 5 mcg by mouth daily.     montelukast (SINGULAIR) 10 MG tablet Take 10 mg by mouth every morning.      Multiple Vitamins-Minerals (PRESERVISION AREDS 2 PO) Take 1 tablet by mouth 2 (two) times daily.     rosuvastatin (CRESTOR) 5 MG tablet Take 1 tablet (5 mg total) by mouth 3 (three) times a week. Monday-Wednesday & Friday 12 tablet 6   traZODone (DESYREL) 50 MG tablet Take 50 mg by mouth at bedtime.     UNKNOWN TO PATIENT 2 time monthly allergy injection - doesn't know the name or dose     No current facility-administered  medications for this visit.   Allergies:  Ciprofloxacin   Social History: The patient  reports that she has never smoked. She has never used smokeless tobacco. She reports that she does not drink alcohol and does not use drugs.   Family History: The patient's family history is not on file.   ROS:  Please see the history of present illness. Otherwise, complete review of systems is positive for none.  All other systems are reviewed and negative.   Physical Exam: VS:  BP 124/60   Pulse 67   Ht 5\' 3"  (1.6 m)   Wt 172 lb 3.2 oz (78.1 kg)   SpO2 98%   BMI 30.50 kg/m , BMI Body mass index is 30.5 kg/m.  Wt Readings from Last 3 Encounters:  02/02/21 172 lb 3.2 oz (78.1 kg)  01/12/21 168 lb (76.2 kg)  07/04/20 173 lb (78.5 kg)    General: Patient appears comfortable at rest. Neck: Supple, no elevated JVP or carotid bruits, no thyromegaly. Lungs: Clear to auscultation, nonlabored breathing at rest. Cardiac: Regular rate and rhythm, no S3 or significant systolic murmur, no pericardial rub. Extremities: No pitting edema, distal pulses 2+. Skin: Warm and dry. Musculoskeletal: No kyphosis. Neuropsychiatric: Alert and oriented x3, affect grossly appropriate.  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  No results found for: CHOL, TRIG, HDL, CHOLHDL, VLDL, LDLCALC, LDLDIRECT  Other Studies Reviewed Today:  Lexiscan Cardiolite 09/03/2019 [facility]):  SPECT: Left ventricular function post-stress was normal. Calculated  ejection fraction is 74%.   Baseline ECG: Normal sinus rhythm.   Stress test: Negative stress test.   SPECT: Left ventricular perfusion is normal. Myocardial perfusion  imaging supports a low risk stress test.    Echocardiogram 09/08/2019 [facility]):  LV: Calculated LVEF is 66%. Biplane method used to measure ejection  fraction. Normal cavity size and systolic  function (ejection fraction  normal). Upper normal wall thickness. Mild (grade 1) left ventricular  diastolic dysfunction.   AO: Mild sinuses of Valsalva and ascending aorta dilatation. Ascending  aorta diameter = 3.7 cm.   PA: Pulmonary arterial systolic pressure is 29 mmHg.  Assessment and Plan:  1. Dyspnea on exertion   2. Other fatigue   3. Mixed hyperlipidemia    1. Dyspnea on exertion Continues with dyspnea on exertion.  At last visit with Dr. [facility] he mentioned undergoing a coronary CT scan for anatomic evaluation.  Please order a coronary CT for continued DOE/SOB which could be an anginal equivalent.  Also get a follow-up echocardiogram for reassessment of LV function and diastolic function, valvular function.  2. Other fatigue Complains of increased fatigue.  She  has a history of hypothyroidism.  She takes vitamin D3 and vitamin C.  States she will be having some lab work by her PCP in the near future.  She states the fatigue usually occurs after she is doing some type of exertional activity and she gives out easily.  3. Mixed hyperlipidemia Patient tried to restart her Crestor for a couple of days and started having myalgias in her leg again and she has since stopped the medication.  Medication Adjustments/Labs and Tests Ordered: Current medicines are reviewed at length with the patient today.  Concerns regarding medicines are outlined above.   Disposition: Follow-up with Dr. Diona Browner or APP 6 to 8 weeks  Signed, Rennis Harding, NP 02/02/2021 11:56 AM    [facility] at [facility] [address] [place], [place], [place] [address] Phone: [phone]; Fax: [phone]

## 2021-02-02 ENCOUNTER — Encounter: Payer: Self-pay | Admitting: Family Medicine

## 2021-02-02 ENCOUNTER — Other Ambulatory Visit: Payer: Self-pay

## 2021-02-02 ENCOUNTER — Ambulatory Visit (INDEPENDENT_AMBULATORY_CARE_PROVIDER_SITE_OTHER): Payer: Medicare Other | Admitting: Family Medicine

## 2021-02-02 VITALS — BP 124/60 | HR 67 | Ht 63.0 in | Wt 172.2 lb

## 2021-02-02 DIAGNOSIS — I208 Other forms of angina pectoris: Secondary | ICD-10-CM | POA: Diagnosis not present

## 2021-02-02 DIAGNOSIS — R5383 Other fatigue: Secondary | ICD-10-CM | POA: Diagnosis not present

## 2021-02-02 DIAGNOSIS — E782 Mixed hyperlipidemia: Secondary | ICD-10-CM

## 2021-02-02 DIAGNOSIS — R0609 Other forms of dyspnea: Secondary | ICD-10-CM

## 2021-02-02 DIAGNOSIS — R0602 Shortness of breath: Secondary | ICD-10-CM

## 2021-02-02 DIAGNOSIS — R0600 Dyspnea, unspecified: Secondary | ICD-10-CM | POA: Diagnosis not present

## 2021-02-02 MED ORDER — METOPROLOL TARTRATE 50 MG PO TABS: 50.0000 mg | ORAL_TABLET | Freq: Once | ORAL | 0 refills | Status: DC

## 2021-02-02 NOTE — Patient Instructions (Addendum)
Medication Instructions:  Your physician recommends that you continue on your current medications as directed. Please refer to the Current Medication list given to you today.  *If you need a refill on your cardiac medications before your next appointment, please call your pharmacy*   Lab Work: None If you have labs (blood work) drawn today and your tests are completely normal, you will receive your results only by: MyChart Message (if you have MyChart) OR A paper copy in the mail If you have any lab test that is abnormal or we need to change your treatment, we will call you to review the results.   Testing/Procedures: Your physician has requested that you have an echocardiogram. Echocardiography is a painless test that uses sound waves to create images of your heart. It provides your doctor with information about the size and shape of your heart and how well your heart's chambers and valves are working. This procedure takes approximately one hour. There are no restrictions for this procedure.      Follow-Up: At [facility], you and your health needs are our priority.  As part of our continuing mission to provide you with exceptional heart care, we have created designated Provider Care Teams.  These Care Teams include your primary Cardiologist (physician) and Advanced Practice Providers (APPs -  Physician Assistants and Nurse Practitioners) who all work together to provide you with the care you need, when you need it.  We recommend signing up for the patient portal called "MyChart".  Sign up information is provided on this After Visit Summary.  MyChart is used to connect with patients for Virtual Visits (Telemedicine).  Patients are able to view lab/test results, encounter notes, upcoming appointments, etc.  Non-urgent messages can be sent to your provider as well.   To learn more about what you can do with MyChart, go to [email or web address].    Your next appointment:   6-8  week(s)  The format for your next appointment:   In Person  Provider:   Nena Polio, NP   Other Instructions

## 2021-02-05 ENCOUNTER — Other Ambulatory Visit: Payer: Self-pay | Admitting: Family Medicine

## 2021-02-06 ENCOUNTER — Telehealth (HOSPITAL_COMMUNITY): Payer: Self-pay | Admitting: *Deleted

## 2021-02-06 LAB — BASIC METABOLIC PANEL
BUN/Creatinine Ratio: 14 (ref 12–28)
BUN: 9 mg/dL (ref 8–27)
CO2: 25 mmol/L (ref 20–29)
Calcium: 9.5 mg/dL (ref 8.7–10.3)
Chloride: 98 mmol/L (ref 96–106)
Creatinine, Ser: 0.64 mg/dL (ref 0.57–1.00)
Glucose: 99 mg/dL (ref 65–99)
Potassium: 4.4 mmol/L (ref 3.5–5.2)
Sodium: 138 mmol/L (ref 134–144)
eGFR: 88 mL/min/{1.73_m2} (ref >59)

## 2021-02-06 LAB — SPECIMEN STATUS REPORT

## 2021-02-06 NOTE — Telephone Encounter (Signed)
Reaching out to patient to offer assistance regarding upcoming cardiac imaging study; pt verbalizes understanding of appt date/time, parking situation and where to check in, pre-test NPO status and medications ordered, and verified current allergies; name and call back number provided for further questions should they arise  River Mckercher RN Navigator Cardiac Imaging [facility] [phone] office [phone] cell  Patient to take 50mg metoprolol tartrate two hours prior cardiac CT scan. 

## 2021-02-08 ENCOUNTER — Other Ambulatory Visit: Payer: Self-pay

## 2021-02-08 ENCOUNTER — Ambulatory Visit (HOSPITAL_COMMUNITY)
Admission: RE | Admit: 2021-02-08 | Discharge: 2021-02-08 | Disposition: A | Discharge status: Home / Self Care | Payer: Medicare Other | Source: Ambulatory Visit | Attending: Family Medicine | Admitting: Family Medicine

## 2021-02-08 ENCOUNTER — Other Ambulatory Visit: Payer: Self-pay | Admitting: Cardiology

## 2021-02-08 ENCOUNTER — Ambulatory Visit (HOSPITAL_COMMUNITY)
Admission: RE | Admit: 2021-02-08 | Discharge: 2021-02-08 | Disposition: A | Discharge status: Home / Self Care | Payer: Medicare Other | Source: Ambulatory Visit | Attending: Cardiology | Admitting: Cardiology

## 2021-02-08 DIAGNOSIS — R0789 Other chest pain: Secondary | ICD-10-CM

## 2021-02-08 DIAGNOSIS — R931 Abnormal findings on diagnostic imaging of heart and coronary circulation: Secondary | ICD-10-CM | POA: Diagnosis present

## 2021-02-08 DIAGNOSIS — I208 Other forms of angina pectoris: Secondary | ICD-10-CM | POA: Insufficient documentation

## 2021-02-08 DIAGNOSIS — R0600 Dyspnea, unspecified: Secondary | ICD-10-CM | POA: Diagnosis present

## 2021-02-08 DIAGNOSIS — R0609 Other forms of dyspnea: Secondary | ICD-10-CM

## 2021-02-08 MED ORDER — NITROGLYCERIN 0.4 MG SL SUBL: 0.8000 mg | SUBLINGUAL_TABLET | Freq: Once | SUBLINGUAL | Status: AC

## 2021-02-08 MED ORDER — NITROGLYCERIN 0.4 MG SL SUBL
SUBLINGUAL_TABLET | SUBLINGUAL | Status: AC
  Administered 2021-02-08: 0.8 mg via SUBLINGUAL
  Filled 2021-02-08: qty 2

## 2021-02-08 MED ORDER — IOHEXOL 350 MG/ML SOLN
95.0000 mL | Freq: Once | INTRAVENOUS | Status: AC | PRN
  Administered 2021-02-08: 95 mL via INTRAVENOUS

## 2021-02-09 ENCOUNTER — Telehealth: Payer: Self-pay | Admitting: *Deleted

## 2021-02-09 NOTE — Telephone Encounter (Signed)
Lesle Chris, LPN  4/43/1540  8:52 AM EDT Back to Top    Notified, copy to pcp.

## 2021-02-09 NOTE — Telephone Encounter (Signed)
LABS -  Labs were done in anticipation of Coronary CT. Labs look good. Im not sure these labs are usually called to the patient. Electrolytes and renal function look good. Netta Neat, NP  02/07/2021 1:58 PM   CORONARY CT -   And let her know the coronary CT showed her calcium score was 52%.  This was 32nd percentile for age and sex matched control.  Results show she has mild nonobstructive CAD with 25 to 49% mid LAD stenosis with fractional flow reserve (flow prior to and beyond the lesion) is normal.  She does have some ectasia (mild dilation of the aorta that is not defined as an aneurysm) of the ascending thoracic aorta with 4.1 cm in diameter.  Recommendations are annual follow-up imaging by CTA or MRA.  Let her know we will start annual CTAs to follow   Netta Neat, NP  02/08/2021 4:34 PM   When you call Amanda Jennings you can tell her the fractional flow reserve analysis  on her Coronary CT by Dr Anne Fu did not show any significant stenosis in her coronary arteries. I had already sent the results to you . Thanks  Netta Neat, NP  02/09/2021 7:46 AM

## 2021-03-08 ENCOUNTER — Other Ambulatory Visit (HOSPITAL_COMMUNITY): Payer: Self-pay | Admitting: Internal Medicine

## 2021-03-08 DIAGNOSIS — M79605 Pain in left leg: Secondary | ICD-10-CM

## 2021-03-08 DIAGNOSIS — M79604 Pain in right leg: Secondary | ICD-10-CM

## 2021-03-15 ENCOUNTER — Other Ambulatory Visit: Payer: Medicare Other

## 2021-03-15 ENCOUNTER — Ambulatory Visit (HOSPITAL_COMMUNITY)
Admission: RE | Admit: 2021-03-15 | Discharge: 2021-03-15 | Disposition: A | Discharge status: Home / Self Care | Payer: Medicare Other | Source: Ambulatory Visit | Attending: Internal Medicine | Admitting: Internal Medicine

## 2021-03-15 ENCOUNTER — Other Ambulatory Visit: Payer: Self-pay

## 2021-03-15 DIAGNOSIS — M79605 Pain in left leg: Secondary | ICD-10-CM | POA: Diagnosis present

## 2021-03-15 DIAGNOSIS — M79604 Pain in right leg: Secondary | ICD-10-CM | POA: Diagnosis present

## 2021-03-19 ENCOUNTER — Other Ambulatory Visit: Payer: Self-pay | Admitting: Internal Medicine

## 2021-03-19 ENCOUNTER — Other Ambulatory Visit: Payer: Self-pay

## 2021-03-19 ENCOUNTER — Ambulatory Visit (INDEPENDENT_AMBULATORY_CARE_PROVIDER_SITE_OTHER): Payer: Medicare Other

## 2021-03-19 DIAGNOSIS — R0609 Other forms of dyspnea: Secondary | ICD-10-CM | POA: Diagnosis not present

## 2021-03-19 DIAGNOSIS — R42 Dizziness and giddiness: Secondary | ICD-10-CM

## 2021-03-20 LAB — ECHOCARDIOGRAM COMPLETE
Area-P 1/2: 1.47 cm2
Calc EF: 58.1 %
S' Lateral: 2.7 cm
Single Plane A2C EF: 63.8 %
Single Plane A4C EF: 52.2 %

## 2021-03-22 ENCOUNTER — Other Ambulatory Visit: Payer: Medicare Other

## 2021-03-26 ENCOUNTER — Telehealth: Payer: Self-pay | Admitting: Cardiology

## 2021-03-26 NOTE — Telephone Encounter (Signed)
Lesle Chris, LPN  70/0/1749  9:30 AM EST Back to Top    Notified, copy to pcp & pulmonology.     Lesle Chris, LPN  44/01/6758  6:15 PM EDT     Left message to return call.   Netta Neat., NP  03/20/2021  2:16 PM EDT     Please call the patient and let her know the echocardiogram shows she has good pumping function of the heart with ejection fraction of 55 to 60%.  She does have some mild grade 1 diastolic dysfunction (impaired relaxation).  Pulmonary artery pressure is mildly elevated at 37.3 mmHg.  She has a trivial leak in her mitral and aortic valves.  These are age-related.  She has some mild dilation of the ascending aorta at 38 mm.  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   Netta Neat, NP  03/20/2021 2:13 PM

## 2021-03-26 NOTE — Telephone Encounter (Signed)
Patient returning call for echo results. 

## 2021-04-08 NOTE — Progress Notes (Signed)
Cardiology Office Note  Date: 04/09/2021   ID: Amanda Jennings, DOB Nov 29, 1940, MRN [id number]  PCP:  [name], MD  Cardiologist:  Nona Dell, MD Electrophysiologist:  None   Chief Complaint: Follow-up echocardiogram and coronary CT  History of Present Illness: Amanda Jennings is a 80 y.o. female with a history of bradycardia, mild atherosclerosis, HLD, hypothyroidism, osteopenia, cholelithiasis, depression, GERD, asthma, arthritis, peripheral edema, DOE, fatigue.  She was last seen by Dr. Diona Browner on 01/12/2021 with complaint of dyspnea on exertion.  She had been exposed to COVID-19 over the weekend and recently tested positive.  At report a cough that time without fever, chills.  She had felt more short of breath with activity over the prior year.  No anginal symptoms.  Relieved with rest.  No shortness of breath at rest or orthopnea.  She had briefly stopped Crestor due to complaints of leg pain.  Plans were to schedule an office visit for examination and discussion of further testing.  Dr. Diona Browner mentioned may need to consider anatomical evaluation with coronary CT.  She was not tolerating Crestor.  She was asked to allow her symptoms to equilibrate to baseline.  May then consider taking Crestor once a week. Her last LDL was 92  She was last here for continued complaints of exertional fatigue and breathlessness.  Stated she could be performing some type of minor labor-intensive tasks such as doing laundry or other activities and becomes short of breath and has to stop what she is doing.  She stated she believed there was something wrong but not quite sure what it might be denied.  She denies any anginal symptoms but she has history of mild atherosclerosis.  Her last visit with Dr. Diona Browner he mentioned she may need to consider evaluation with coronary CT.  She had stopped her Crestor but attempted to start back and started having muscular pain and eventually stopped medication  again.   She is here today for follow-up of recent echocardiogram and coronary CT.  We reviewed due to the results of both studies.  She verbalizes understanding.  She recently had a normal ABI by her primary care provider for burning in her legs.  Echocardiogram showed EF of 55 to 60%.  No WMA's, G1 DD.  Mildly elevated PASP.  37.3 mmHg.  Trivial MR, trivial AR mild dilatation of ascending aorta at 38 mm.  Coronary CT 02/08/2021 no significant stenosis of left main, LAD no significant stenosis 0.93-0.87 past mid LAD lesion of 25 to 49%.  LCx no significant stenosis, RCA no significant stenosis.  CAC score was 52 (LAD 50, LCx 2.  32nd percentile for age and sex matched control.  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  Recommended annual imaging by CTA or MRA.  She denies any issues other than feeling dizzy at times when her heart rate increases up into the 80s range.  She denies any frank syncope.  We discussed possibly placing a monitor if she became more symptomatic and symptoms but became more frequent.  She has an upcoming carotid study on December 8.  Currently denies any significant cardiac issues.  Blood pressures well controlled on current therapy.  Denies any current DOE or SOB, PND, orthopnea, weight gain.  She has actually lost weight since last visit.   Past Medical History:  Diagnosis Date   Arthritis    Asthma    Bradycardia    Cataracts, bilateral    Cholelithiasis  Coronary atherosclerosis of native coronary artery    Mild atherosclerosis   Depression    GERD (gastroesophageal reflux disease)    History of positive PPD    Hx of migraines    Hyperlipidemia    Hypothyroidism    Osteopenia     Past Surgical History:  Procedure Laterality Date   Bilateral tubal ligation     Cataract surgery     Colonic polyp resection      Current Outpatient Medications  Medication Sig Dispense Refill   acetaminophen (TYLENOL) 500 MG tablet Take  500 mg by mouth every 6 (six) hours as needed.     albuterol (PROVENTIL HFA;VENTOLIN HFA) 108 (90 Base) MCG/ACT inhaler Inhale into the lungs every 6 (six) hours as needed for wheezing or shortness of breath.     Ascorbic Acid (VITAMIN C ER PO) Take 1 tablet by mouth daily.      Budesonide-Formoterol Fumarate (SYMBICORT IN) Inhale into the lungs.     Cholecalciferol (VITAMIN D3) 5000 UNITS CAPS Take 1 capsule by mouth daily.     Coenzyme Q10 (CO Q 10 PO) Take by mouth daily.      fexofenadine (ALLEGRA) 180 MG tablet Take 180 mg by mouth daily.     furosemide (LASIX) 20 MG tablet TAKE 1 TABLET BY MOUTH AS NEEDED FOR  LEG  SWELLING 30 tablet 6   levothyroxine (SYNTHROID) 112 MCG tablet Take 112 mcg by mouth daily before breakfast.     liothyronine (CYTOMEL) 5 MCG tablet Take 5 mcg by mouth daily.     metoprolol tartrate (LOPRESSOR) 50 MG tablet Take 1 tablet (50 mg total) by mouth once for 1 dose. Take once 90 minutes prior to CTA 1 tablet 0   montelukast (SINGULAIR) 10 MG tablet Take 10 mg by mouth every morning.      Multiple Vitamins-Minerals (PRESERVISION AREDS 2 PO) Take 1 tablet by mouth 2 (two) times daily.     rosuvastatin (CRESTOR) 5 MG tablet Take 1 tablet (5 mg total) by mouth 3 (three) times a week. Monday-Wednesday & Friday 12 tablet 6   traZODone (DESYREL) 50 MG tablet Take 50 mg by mouth at bedtime.     UNKNOWN TO PATIENT 2 time monthly allergy injection - doesn't know the name or dose     No current facility-administered medications for this visit.   Allergies:  Ciprofloxacin   Social History: The patient  reports that she has never smoked. She has never used smokeless tobacco. She reports that she does not drink alcohol and does not use drugs.   Family History: The patient's family history is not on file.   ROS:  Please see the history of present illness. Otherwise, complete review of systems is positive for none.  All other systems are reviewed and negative.   Physical  Exam: VS:  BP 122/78   Pulse 72   Ht 5\' 4"  (1.626 m)   Wt 141 lb 3.2 oz (64 kg)   SpO2 97%   BMI 24.24 kg/m , BMI Body mass index is 24.24 kg/m.  Wt Readings from Last 3 Encounters:  04/09/21 141 lb 3.2 oz (64 kg)  02/02/21 172 lb 3.2 oz (78.1 kg)  01/12/21 168 lb (76.2 kg)    General: Patient appears comfortable at rest. Neck: Supple, no elevated JVP or carotid bruits, no thyromegaly. Lungs: Clear to auscultation, nonlabored breathing at rest. Cardiac: Regular rate and rhythm, no S3 or significant systolic murmur, no pericardial rub. Extremities: No pitting edema,  distal pulses 2+. Skin: Warm and dry. Musculoskeletal: No kyphosis. Neuropsychiatric: Alert and oriented x3, affect grossly appropriate.  ECG: 02/02/2022 electronic atrial pacemaker, rate of 70, LAD, LBBB  Recent Labwork: 02/05/2021: BUN 9; Creatinine, Ser 0.64; Potassium 4.4; Sodium 138  No results found for: CHOL, TRIG, HDL, CHOLHDL, VLDL, LDLCALC, LDLDIRECT  Other Studies Reviewed Today:   Echocardiogram 03/20/2021  1. Left ventricular ejection fraction, by estimation, is 55 to 60%. The  left ventricle has normal function. The left ventricle has no regional  wall motion abnormalities. Left ventricular diastolic parameters are  consistent with Grade I diastolic  dysfunction (impaired relaxation).   2. Right ventricular systolic function is normal. The right ventricular  size is normal. There is mildly elevated pulmonary artery systolic  pressure. The estimated right ventricular systolic pressure is 37.3 mmHg.   3. The mitral valve is grossly normal. Trivial mitral valve  regurgitation.   4. The aortic valve is tricuspid. Aortic valve regurgitation is trivial.   5. Aortic dilatation noted. There is mild dilatation of the ascending  aorta, measuring 38 mm.   6. The inferior vena cava is normal in size with greater than 50%  respiratory variability, suggesting right atrial pressure of 3 mmHg.   Comparison(s):  No prior Echocardiogram.    Coronary CTA with FFR 02/08/2021 Narrative & Impression  EXAM: CT FFR ANALYSIS   FINDINGS: CT FFR analysis was performed on the original cardiac computed tomography angiogram dataset. Diagrammatic representation of the CT FFR analysis is provided in a separate PDF document in PACS. This dictation was created using the PDF document and an interactive 3D model of the results. The 3D model is not available in the EMR/PACS. Normal CT FFR range is >0.80.   1. Left Main: No significant stenosis.   2. LAD: No significant stenosis. (0.93 to 0.87 past mid LAD lesion of 25-49%) 3. LCX: No significant stenosis. 4. RCA: No significant stenosis.   IMPRESSION: 1.  CT FFR analysis didn't show any significant stenosis.     IMPRESSION: 1. Coronary calcium score of 52 (LAD 50, LCX 2). This was 32 percentile for age and sex matched control.   2. Normal coronary origin with right dominance.   3. LAD is a large vessel that has proximal calcified plaque, 0-24% stenosis. There is mid stenosis 25-49%. FFR normal (0.93 to 0.87) .   CAD-RADS 2. Mild non-obstructive CAD (25-49%). Consider non-atherosclerotic causes of chest pain. Consider preventive therapy and risk factor modification.     IMPRESSION: 1.  Aortic Atherosclerosis (ICD10-I70.0). 2. 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 Recommend annual imaging followup by CTA or MRA. This recommendation follows 2010 ACCF/AHA/AATS/ACR/ASA/SCA/SCAI/SIR/STS/SVM Guidelines for the Diagnosis and Management of Patients with Thoracic Aortic Disease. Circulation. 2010; 121: X832-N191. Aortic aneurysm NOS (ICD10-I71.9).   Lexiscan Cardiolite 09/03/2019 ([place]):  SPECT: Left ventricular function post-stress was normal. Calculated  ejection fraction is 74%.   Baseline ECG: Normal sinus rhythm.   Stress test: Negative stress test.   SPECT: Left ventricular perfusion is normal. Myocardial  perfusion  imaging supports a low risk stress test.     Echocardiogram 09/08/2019 [facility]):  LV: Calculated LVEF is 66%. Biplane method used to measure ejection  fraction. Normal cavity size and systolic function (ejection fraction  normal). Upper normal wall thickness. Mild (grade 1) left ventricular  diastolic dysfunction.   AO: Mild sinuses of Valsalva and ascending aorta dilatation. Ascending  aorta diameter = 3.7 cm.   PA: Pulmonary arterial systolic pressure  is 29 mmHg.  Assessment and Plan:  1. Dyspnea on exertion   2. Other fatigue   3. Mixed hyperlipidemia   4. Peripheral edema   5. Dizziness     1. Dyspnea on exertion Echocardiogram showed EF of 55 to 60%.  No WMA's, G1 DD.  Mildly elevated PASP.  37.3 mmHg.  Trivial MR, trivial AR mild dilatation of ascending aorta at 38 mm. Coronary CT 02/08/2021 no significant stenosis of left main, LAD no significant stenosis 0.93-0.87 past mid LAD lesion of 25 to 49%.  LCx no significant stenosis, RCA no significant stenosis.  CAC score was 52 (LAD 50, LCx 2.  32nd percentile for age and sex matched control.  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  Recommended annual imaging by CTA or MR.  Currently denies any anginal symptoms, DOE or SOB.  We discussed the results of both echocardiogram and coronary CT with FFR.  She verbalizes understanding.    2. Other fatigue At last visit she complained of increased fatigue.  She has a history of hypothyroidism.  She was taking vitamin D3 and vitamin C. was pending lab work with her PCP in the near future.  She stated fatigue usually occurred after performing some type of exertional activity and she would give out easily.  No current complaints of fatigue during visit today.  3. Mixed hyperlipidemia Continue Crestor 5 mg p.o. 3 times weekly.  Last lipid panel in epic 11/29/2019 TC 180, TG 177, HDL 58, LDL 92.  4.  Lower extremity edema Continue as needed Lasix  for lower extremity edema 20 mg p.o. as needed  5.  Dizziness. States she has been having some transient dizziness usually when her heart rate becomes a little fast in the 80s.  She has a pulse ox monitor which she uses to monitor her heart rates.  She states at rest her heart rate is usually in 50s to 60s.  We discussed if she has increasing episodes or sensation of rapid heartbeats/palpitations with associated dizziness we could place a cardiac monitor.  She verbalizes understanding.  She has an upcoming carotid study on April 26, 2021.   Medication Adjustments/Labs and Tests Ordered: Current medicines are reviewed at length with the patient today.  Concerns regarding medicines are outlined above.   Disposition: Follow-up with Dr. Diona Browner or APP 6 months  Signed, Rennis Harding, NP 04/09/2021 12:16 PM    [facility] at [facility] [address] [place], [place], [place] [address] Phone: [phone]; Fax: [phone]

## 2021-04-09 ENCOUNTER — Encounter: Payer: Self-pay | Admitting: Family Medicine

## 2021-04-09 ENCOUNTER — Ambulatory Visit (INDEPENDENT_AMBULATORY_CARE_PROVIDER_SITE_OTHER): Payer: Medicare Other | Admitting: Family Medicine

## 2021-04-09 VITALS — BP 122/78 | HR 72 | Ht 64.0 in | Wt 141.2 lb

## 2021-04-09 DIAGNOSIS — R5383 Other fatigue: Secondary | ICD-10-CM

## 2021-04-09 DIAGNOSIS — E782 Mixed hyperlipidemia: Secondary | ICD-10-CM

## 2021-04-09 DIAGNOSIS — R42 Dizziness and giddiness: Secondary | ICD-10-CM

## 2021-04-09 DIAGNOSIS — R609 Edema, unspecified: Secondary | ICD-10-CM

## 2021-04-09 DIAGNOSIS — R0609 Other forms of dyspnea: Secondary | ICD-10-CM | POA: Diagnosis not present

## 2021-04-09 NOTE — Patient Instructions (Signed)

## 2021-04-26 ENCOUNTER — Ambulatory Visit (INDEPENDENT_AMBULATORY_CARE_PROVIDER_SITE_OTHER): Payer: Medicare Other

## 2021-04-26 ENCOUNTER — Other Ambulatory Visit: Payer: Self-pay

## 2021-04-26 DIAGNOSIS — R42 Dizziness and giddiness: Secondary | ICD-10-CM

## 2021-07-24 ENCOUNTER — Encounter

## 2021-08-02 ENCOUNTER — Inpatient Hospital Stay: Admit: 2021-08-02 | Payer: MEDICARE | Attending: Family | Primary: Internal Medicine

## 2021-08-02 DIAGNOSIS — R0602 Shortness of breath: Secondary | ICD-10-CM

## 2021-08-02 DIAGNOSIS — J432 Centrilobular emphysema: Secondary | ICD-10-CM

## 2021-08-20 ENCOUNTER — Other Ambulatory Visit: Payer: Self-pay | Admitting: Cardiology

## 2021-10-15 NOTE — Progress Notes (Unsigned)
Cardiology Office Note  Date: 10/16/2021   ID: Trini, Soldo 07-Aug-1940, MRN [id number]  PCP:  [name], MD  Cardiologist:  Nona Dell, MD Electrophysiologist:  None   Chief Complaint  Patient presents with   Cardiac follow-up    History of Present Illness: Amanda Jennings is an 81 y.o. female last seen in November 2022 by Mr. Vincenza Hews NP.  She is here for a follow-up visit.  No progressive sense of shortness of breath with typical activities.  She is limited by arthritic knee pain.  No exertional angina.  Coronary CTA with FFR back in September 2022 revealed nonobstructive coronary atherosclerosis with calcium score 52.  We discussed these results again today.  Plan to continue Crestor and observation.  I personally reviewed her ECG which shows sinus rhythm with leftward axis and decreased R wave progression.  Past Medical History:  Diagnosis Date   Arthritis    Asthma    Bradycardia    Cataracts, bilateral    Cholelithiasis    Coronary atherosclerosis of native coronary artery    Mild atherosclerosis   Depression    GERD (gastroesophageal reflux disease)    History of positive PPD    Hx of migraines    Hyperlipidemia    Hypothyroidism    Osteopenia     Past Surgical History:  Procedure Laterality Date   Bilateral tubal ligation     Cataract surgery     Colonic polyp resection      Current Outpatient Medications  Medication Sig Dispense Refill   acetaminophen (TYLENOL) 500 MG tablet Take 500 mg by mouth every 6 (six) hours as needed.     albuterol (PROVENTIL HFA;VENTOLIN HFA) 108 (90 Base) MCG/ACT inhaler Inhale into the lungs every 6 (six) hours as needed for wheezing or shortness of breath.     Ascorbic Acid (VITAMIN C ER PO) Take 1 tablet by mouth daily.      Budesonide-Formoterol Fumarate (SYMBICORT IN) Inhale into the lungs.     Cholecalciferol (VITAMIN D3) 5000 UNITS CAPS Take 1 capsule by mouth daily.     Coenzyme Q10 (CO Q 10 PO) Take by  mouth daily.      fexofenadine (ALLEGRA) 180 MG tablet Take 180 mg by mouth daily.     furosemide (LASIX) 20 MG tablet TAKE 1 TABLET BY MOUTH AS NEEDED FOR  LEG  SWELLING 30 tablet 6   levothyroxine (SYNTHROID) 112 MCG tablet Take 112 mcg by mouth daily before breakfast.     liothyronine (CYTOMEL) 5 MCG tablet Take 5 mcg by mouth daily.     montelukast (SINGULAIR) 10 MG tablet Take 10 mg by mouth every morning.      Multiple Vitamins-Minerals (PRESERVISION AREDS 2 PO) Take 1 tablet by mouth 2 (two) times daily.     rosuvastatin (CRESTOR) 5 MG tablet TAKE ONE TABLET BY MOUTH THREE TIMES PER WEEK ON MONDAY, WEDNESDAY AND FRIDAY 12 tablet 6   traZODone (DESYREL) 50 MG tablet Take 50 mg by mouth at bedtime as needed.     UNKNOWN TO PATIENT 2 time monthly allergy injection - doesn't know the name or dose     No current facility-administered medications for this visit.   Allergies:  Ciprofloxacin   ROS:  No syncope.  Physical Exam: VS:  BP 128/80   Pulse 64   Ht 5\' 3"  (1.6 m)   Wt 170 lb 6.4 oz (77.3 kg)   SpO2 98%   BMI 30.19 kg/m , BMI  Body mass index is 30.19 kg/m.  Wt Readings from Last 3 Encounters:  10/16/21 170 lb 6.4 oz (77.3 kg)  04/09/21 141 lb 3.2 oz (64 kg)  02/02/21 172 lb 3.2 oz (78.1 kg)    General: Patient appears comfortable at rest. HEENT: Conjunctiva and lids normal. Neck: Supple, no elevated JVP or carotid bruits, no thyromegaly. Lungs: Clear to auscultation, nonlabored breathing at rest. Cardiac: Regular rate and rhythm, no S3 or significant systolic murmur, no pericardial rub. Extremities: No pitting edema.  ECG:  An ECG dated 07/04/2020 was personally reviewed today and demonstrated:  Sinus rhythm with leftward axis.  Recent Labwork: 02/05/2021: BUN 9; Creatinine, Ser 0.64; Potassium 4.4; Sodium 138   Other Studies Reviewed Today:  Coronary CTA 02/08/2021: IMPRESSION: 1. Coronary calcium score of 52 (LAD 50, LCX 2). This was 32 percentile for age and sex  matched control.   2. Normal coronary origin with right dominance.   3. LAD is a large vessel that has proximal calcified plaque, 0-24% stenosis. There is mid stenosis 25-49%. FFR normal (0.93 to 0.87) .   CAD-RADS 2. Mild non-obstructive CAD (25-49%). Consider non-atherosclerotic causes of chest pain. Consider preventive Therapy and risk factor modification.  1. Left Main: No significant stenosis. 2. LAD: No significant stenosis. (0.93 to 0.87 past mid LAD lesion of 25-49%) 3. LCX: No significant stenosis. 4. RCA: No significant stenosis.   IMPRESSION: 1.  CT FFR analysis didn't show any significant stenosis.  Echocardiogram 03/19/2021:  1. Left ventricular ejection fraction, by estimation, is 55 to 60%. The  left ventricle has normal function. The left ventricle has no regional  wall motion abnormalities. Left ventricular diastolic parameters are  consistent with Grade I diastolic  dysfunction (impaired relaxation).   2. Right ventricular systolic function is normal. The right ventricular  size is normal. There is mildly elevated pulmonary artery systolic  pressure. The estimated right ventricular systolic pressure is 37.3 mmHg.   3. The mitral valve is grossly normal. Trivial mitral valve  regurgitation.   4. The aortic valve is tricuspid. Aortic valve regurgitation is trivial.   5. Aortic dilatation noted. There is mild dilatation of the ascending  aorta, measuring 38 mm.   6. The inferior vena cava is normal in size with greater than 50%  respiratory variability, suggesting right atrial pressure of 3 mmHg.   Carotid Dopplers 04/26/2021: Summary:  Right Carotid: There was no evidence of thrombus, dissection,  atherosclerotic                 plaque or stenosis in the cervical carotid system.   Left Carotid: There was no evidence of thrombus, dissection,  atherosclerotic                plaque or stenosis in the cervical carotid system.   Vertebrals:  Bilateral  vertebral arteries demonstrate antegrade flow.  Subclavians: Normal flow hemodynamics were seen in bilateral subclavian               arteries.   Assessment and Plan:  1.  Nonobstructive coronary atherosclerosis by coronary CTA with negative FFR and calcium score of only 52.  Plan to continue statin therapy and observation.  LVEF also normal at 55 to 60% by echocardiogram.  2.  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  She continues on MDIs.  Medication Adjustments/Labs and Tests Ordered: Current medicines are reviewed at length with the patient today.  Concerns regarding medicines are outlined above.   Tests Ordered: No  orders of the defined types were placed in this encounter.   Medication Changes: No orders of the defined types were placed in this encounter.   Disposition:  Follow up  1 year.  Signed, Jonelle Sidle, MD, [facility] 10/16/2021 11:41 AM    [facility] at [facility] [address] [place], [place], [place] [address] Phone: [phone]; Fax: [phone]

## 2021-10-16 ENCOUNTER — Encounter: Payer: Self-pay | Admitting: Cardiology

## 2021-10-16 ENCOUNTER — Ambulatory Visit (INDEPENDENT_AMBULATORY_CARE_PROVIDER_SITE_OTHER): Payer: Medicare Other | Admitting: Cardiology

## 2021-10-16 VITALS — BP 128/80 | HR 64 | Ht 63.0 in | Wt 170.4 lb

## 2021-10-16 DIAGNOSIS — J439 Emphysema, unspecified: Secondary | ICD-10-CM

## 2021-10-16 DIAGNOSIS — I2510 Atherosclerotic heart disease of native coronary artery without angina pectoris: Secondary | ICD-10-CM

## 2021-10-16 NOTE — Addendum Note (Signed)
Addended by: Lesle Chris on: 10/16/2021 11:52 AM   Modules accepted: Orders

## 2021-10-16 NOTE — Patient Instructions (Signed)

## 2021-11-07 NOTE — Telephone Encounter (Signed)
Dr.Magan Caralee Ates from [facility] [phone] called ask that Dr.Meyers gives her a call on patient Leslie Potter. Narayan.

## 2021-11-09 ENCOUNTER — Telehealth: Payer: Self-pay | Admitting: Cardiology

## 2021-11-09 DIAGNOSIS — E782 Mixed hyperlipidemia: Secondary | ICD-10-CM

## 2021-11-09 MED ORDER — ROSUVASTATIN CALCIUM 5 MG PO TABS: 5.0000 mg | ORAL_TABLET | Freq: Every day | ORAL | 3 refills | Status: DC

## 2021-11-09 NOTE — Telephone Encounter (Signed)
Spoke to pt who verbalized understanding.  

## 2021-12-11 LAB — LIPID PANEL
Chol/HDL Ratio: 3 ratio (ref 0.0–4.4)
Cholesterol, Total: 156 mg/dL (ref 100–199)
HDL: 52 mg/dL (ref >39)
LDL Chol Calc (NIH): 74 mg/dL (ref 0–99)
Triglycerides: 178 mg/dL — ABNORMAL HIGH (ref 0–149)
VLDL Cholesterol Cal: 30 mg/dL (ref 5–40)

## 2021-12-11 LAB — HEPATIC FUNCTION PANEL
ALT: 21 IU/L (ref 0–32)
AST: 21 IU/L (ref 0–40)
Albumin: 4.3 g/dL (ref 3.7–4.7)
Alkaline Phosphatase: 104 IU/L (ref 44–121)
Bilirubin Total: 0.4 mg/dL (ref 0.0–1.2)
Bilirubin, Direct: 0.12 mg/dL (ref 0.00–0.40)
Total Protein: 6.4 g/dL (ref 6.0–8.5)

## 2022-03-11 ENCOUNTER — Encounter: Payer: Self-pay | Admitting: *Deleted

## 2022-03-11 ENCOUNTER — Telehealth: Payer: Self-pay | Admitting: Cardiology

## 2022-03-11 DIAGNOSIS — R0602 Shortness of breath: Secondary | ICD-10-CM

## 2022-03-11 MED ORDER — FUROSEMIDE 20 MG PO TABS: 20.0000 mg | ORAL_TABLET | Freq: Every day | ORAL | Status: AC

## 2022-03-11 NOTE — Telephone Encounter (Signed)
Pt c/o BP issue: STAT if pt c/o blurred vision, one-sided weakness or slurred speech  1. What are your last 5 BP readings?  127/90 - Today 160/90  2. Are you having any other symptoms (ex. Dizziness, headache, blurred vision, passed out)? Headache and dizziness  3. What is your BP issue? Pt states that for the last 2 weeks her BP has been up and down. Pt would like to know if she needs to be put BP meds. Please advise

## 2022-03-11 NOTE — Telephone Encounter (Signed)
Spoke with patient in regards to her BP readings.  States has been going on for about the last 2 weeks with worsening SOB and fatigue.  HR running from the low 40's (mainly at night) which is not new up to the 70-80's depending on her activity.  States her pcp recently increased her Lasix to daily due to swelling in her legs & also prescribed compression stockings which she is wearing.  Some labs are available in Epic, but states pcp did labs on 02/25/22 (requested these today).    10/8 - 160/96 10/9 - 155/94 10/11 - 145/82 10/12 - 149/88 10/13 - 145/85 10/14 - 145/93 10/15 - 148/76 10/16 - 160/98 10/17 - 152/94  She is also asking for recommendations on who you would suggest for pulmonologist in [place].  Getting difficult for her to travel to [place].

## 2022-03-12 NOTE — Telephone Encounter (Signed)
Patient notified and verbalized understanding.  Awaiting labs requested from pcp.

## 2022-03-14 ENCOUNTER — Ambulatory Visit: Payer: Medicare Other | Attending: Cardiology

## 2022-03-14 DIAGNOSIS — R0602 Shortness of breath: Secondary | ICD-10-CM

## 2022-03-14 LAB — ECHOCARDIOGRAM COMPLETE
AR max vel: 1.96 cm2
AV Peak grad: 6.9 mmHg
Ao pk vel: 1.31 m/s
Area-P 1/2: 1.48 cm2
Calc EF: 61 %
MV M vel: 2.49 m/s
MV Peak grad: 24.8 mmHg
P 1/2 time: 2505 msec
S' Lateral: 2.92 cm
Single Plane A2C EF: 59.7 %
Single Plane A4C EF: 62.1 %

## 2022-03-15 ENCOUNTER — Telehealth: Payer: Self-pay

## 2022-03-15 DIAGNOSIS — I25119 Atherosclerotic heart disease of native coronary artery with unspecified angina pectoris: Secondary | ICD-10-CM

## 2022-03-15 MED ORDER — LOSARTAN POTASSIUM 25 MG PO TABS: 25.0000 mg | ORAL_TABLET | Freq: Every day | ORAL | 0 refills | Status: DC

## 2022-03-15 NOTE — Telephone Encounter (Signed)
-----   Message from Satira Sark, MD sent at 03/14/2022  5:16 PM EDT ----- I was able to review her lab work, renal function and potassium normal.  Suggest starting Cozaar 25 mg daily and continue to track blood pressure.  She would need to follow-up BMET 10 days after starting. ----- Message ----- From: Sung Amabile, CMA Sent: 03/14/2022   3:58 PM EDT To: Satira Sark, MD  Patient made aware. Verbalized understanding. Also wanted to know if Dr. Domenic Polite was able to review her labs from her PCP and if she needed to start any medication for her BP. Please advise.

## 2022-03-15 NOTE — Telephone Encounter (Signed)
Patient made aware and verbalized understanding.Cozaar 25 mg once a day sent to [facility] [place]. Aware that she needs to have a BMET done in 10 days after starting this medication. Per patient request, lab order has been sent to [place] and mailed to patients home.

## 2022-03-18 ENCOUNTER — Encounter: Payer: Self-pay | Admitting: Cardiology

## 2022-04-02 LAB — BASIC METABOLIC PANEL
BUN/Creatinine Ratio: 24 (ref 12–28)
BUN: 19 mg/dL (ref 8–27)
CO2: 27 mmol/L (ref 20–29)
Calcium: 9.7 mg/dL (ref 8.7–10.3)
Chloride: 97 mmol/L (ref 96–106)
Creatinine, Ser: 0.79 mg/dL (ref 0.57–1.00)
Glucose: 97 mg/dL (ref 70–99)
Potassium: 4.8 mmol/L (ref 3.5–5.2)
Sodium: 140 mmol/L (ref 134–144)
eGFR: 74 mL/min/{1.73_m2} (ref >59)

## 2022-04-05 ENCOUNTER — Encounter: Payer: Self-pay | Admitting: Cardiology

## 2022-04-09 ENCOUNTER — Encounter: Payer: Self-pay | Admitting: Cardiology

## 2022-04-09 ENCOUNTER — Other Ambulatory Visit: Payer: Self-pay | Admitting: *Deleted

## 2022-04-09 MED ORDER — AMLODIPINE BESYLATE 2.5 MG PO TABS: 2.5000 mg | ORAL_TABLET | Freq: Every day | ORAL | 1 refills | Status: DC

## 2022-04-10 ENCOUNTER — Other Ambulatory Visit: Payer: Self-pay | Admitting: Cardiology

## 2022-05-03 ENCOUNTER — Encounter: Payer: Self-pay | Admitting: Cardiology

## 2022-05-12 IMAGING — CT CT HEART MORP W/ CTA COR W/ SCORE W/ CA W/CM &/OR W/O CM
4 of 7 series · 8 of 20 positions shown, 9 images · non-contrast
Comparison: None.
COMPARISON: None.

Addendum:
EXAM:
OVER-READ INTERPRETATION  CT CHEST

The following report is an over-read performed by radiologist Dr.
Ourari Tiger [REDACTED] on 02/08/2021. This
over-read does not include interpretation of cardiac or coronary
anatomy or pathology. The coronary calcium score/coronary CTA
interpretation by the cardiologist is attached.
CLINICAL DATA: 80 year old with anginal equivalent.
Cardiac/Coronary  CTA
TECHNIQUE: The patient was scanned on a [employer] Force scanner.

[Series 6: ts diast sharp · axial · 0.35mm/px · z∈[-43,-9]mm · 2 of 255 slices shown]
[im 85/255  lung]
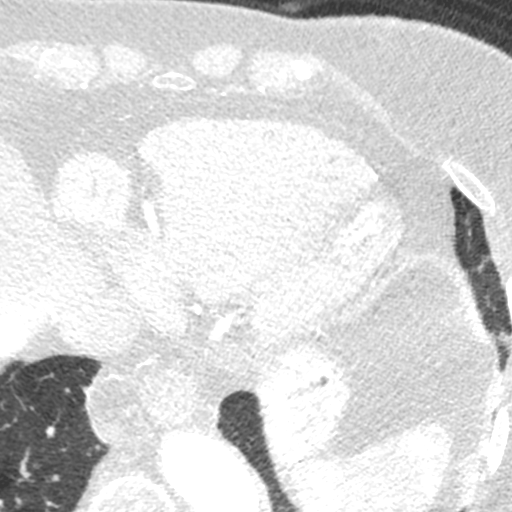
[im 170/255  lung]
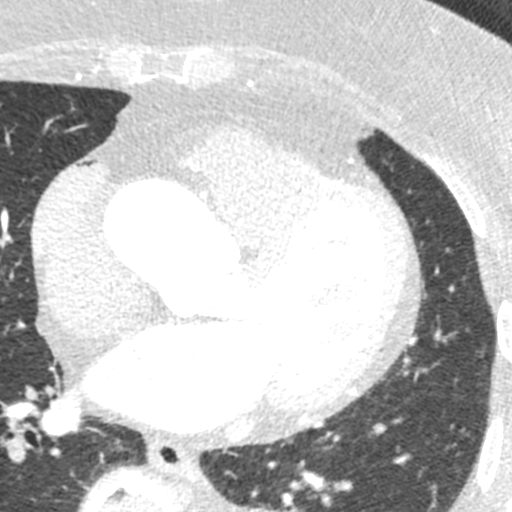

[Series 7: best diast · axial · 0.35mm/px · z∈[-44,-9]mm · 2 of 252 slices shown, 3 images]
[im 84/252  vessel]
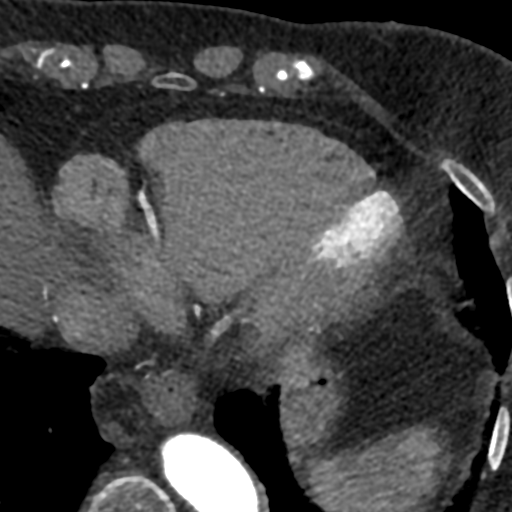
[im 84/252  lung]
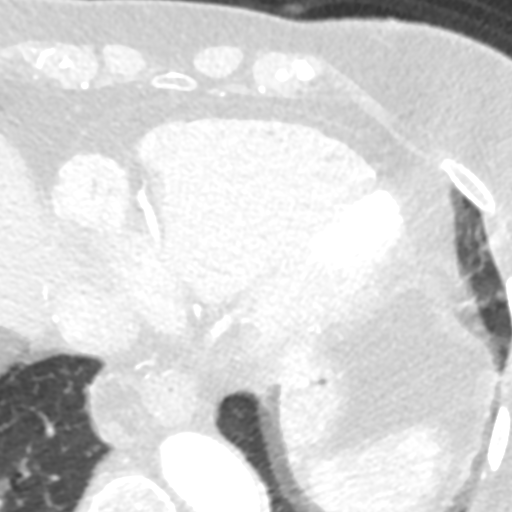
[im 168/252  vessel]
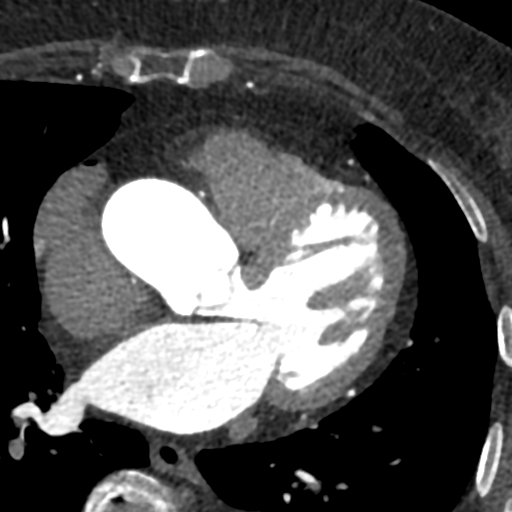

[Series 8: best syst · axial · 0.35mm/px · z∈[-43,-9]mm · 2 of 256 slices shown]
[im 86/256  vessel]
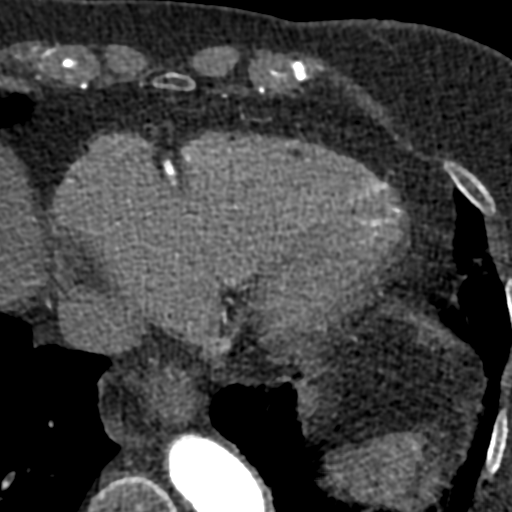
[im 171/256  vessel]
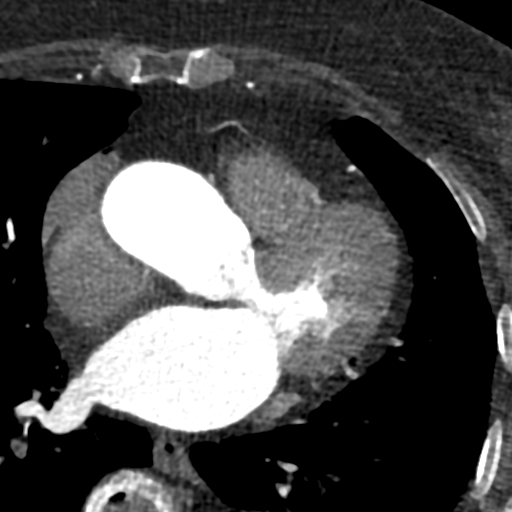

[Series 9: ts syst sharp · axial · 0.35mm/px · z∈[-43,-9]mm · 2 of 255 slices shown]
[im 85/255  lung]
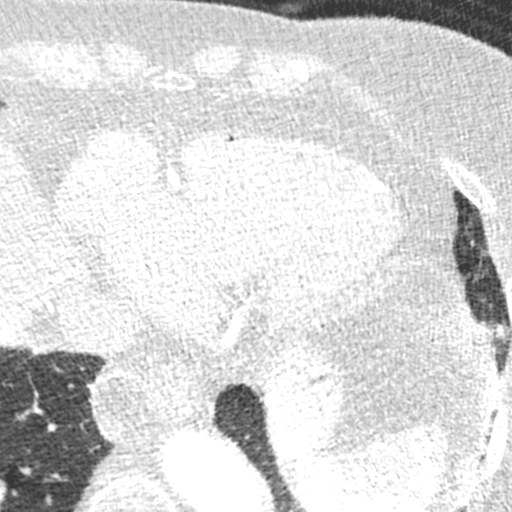
[im 170/255  lung]
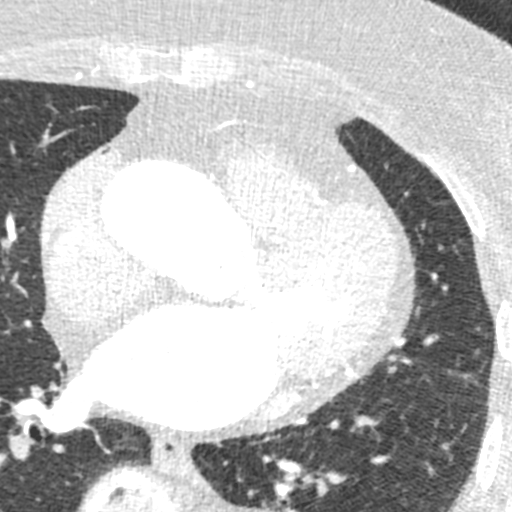

[8 of 20 positions shown; findings below may reference images not displayed]

FINDINGS: Atherosclerotic calcifications in the thoracic aorta, with ectasia
of the ascending thoracic aorta (4.1 cm in diameter). Within the
visualized portions of the thorax there are no suspicious appearing
pulmonary nodules or masses, there is no acute consolidative
airspace disease, no pleural effusions, no pneumothorax and no
lymphadenopathy. Visualized portions of the upper abdomen are
unremarkable. There are no aggressive appearing lytic or blastic
lesions noted in the visualized portions of the skeleton.
IMPRESSION: 1.  Aortic Atherosclerosis (DCKLJ-09T.T).
2. Ectasia of the ascending thoracic aorta (4.1 cm in diameter).
Recommend annual imaging followup by CTA or MRA. This recommendation
follows 7474 ACCF/AHA/AATS/ACR/ASA/SCA/AMAIRANI/PAULLO/KURBAN SEYIT/LEANDRO Guidelines
for the Diagnosis and Management of Patients with Thoracic Aortic
Disease. Circulation. 7474; 121: E266-e369. Aortic aneurysm NOS
(DCKLJ-WOR.3).
FINDINGS: A 120 kV prospective scan was triggered in the descending thoracic
aorta at 111 HU's. Axial non-contrast 3 mm slices were carried out
through the heart. The data set was analyzed on a dedicated work
station and scored using the Agatson method. Gantry rotation speed
was 250 msecs and collimation was .6 mm. No beta blockade and 0.8 mg
of sl NTG was given. The 3D data set was reconstructed in 5%
intervals of the 67-82 % of the R-R cycle. Diastolic phases were
analyzed on a dedicated work station using MPR, MIP and VRT modes.
The patient received 80 cc of contrast.

There is misregistration artifact.  Images (RCA) are interpretable.

Aorta:  Normal size.  No calcifications.  No dissection.

Aortic Valve:  Trileaflet.  No calcifications.

Coronary Arteries:  Normal coronary origin.  Right dominance.

RCA is a large dominant artery that gives rise to PDA and PLA. There
is no plaque.

Left main is a large artery that gives rise to LAD and LCX arteries.

LAD is a large vessel that has proximal calcified plaque, 0-24%
stenosis. There is mid stenosis 25-49%. FFR normal (0.93 to 0.87)

LCX is a non-dominant artery that gives rise to one large OM1
branch. There is no plaque.

Other findings:

Normal pulmonary vein drainage into the left atrium.

Normal left atrial appendage without a thrombus.

Normal size of the pulmonary artery.

Please see radiology report for non cardiac findings.
IMPRESSION: 1. Coronary calcium score of 52 (LAD 50, LCX 2). This was 32
percentile for age and sex matched control.

2. Normal coronary origin with right dominance.

3. LAD is a large vessel that has proximal calcified plaque, 0-24%
stenosis. There is mid stenosis 25-49%. FFR normal (0.93 to 0.87) .

CAD-RADS 2. Mild non-obstructive CAD (25-49%). Consider
non-atherosclerotic causes of chest pain. Consider preventive
therapy and risk factor modification.

*** End of Addendum ***
EXAM:
OVER-READ INTERPRETATION  CT CHEST

The following report is an over-read performed by radiologist Dr.
Ourari Tiger [REDACTED] on 02/08/2021. This
over-read does not include interpretation of cardiac or coronary
anatomy or pathology. The coronary calcium score/coronary CTA
interpretation by the cardiologist is attached.
FINDINGS: Atherosclerotic calcifications in the thoracic aorta, with ectasia
of the ascending thoracic aorta (4.1 cm in diameter). Within the
visualized portions of the thorax there are no suspicious appearing
pulmonary nodules or masses, there is no acute consolidative
airspace disease, no pleural effusions, no pneumothorax and no
lymphadenopathy. Visualized portions of the upper abdomen are
unremarkable. There are no aggressive appearing lytic or blastic
lesions noted in the visualized portions of the skeleton.
IMPRESSION: 1.  Aortic Atherosclerosis (DCKLJ-09T.T).
2. Ectasia of the ascending thoracic aorta (4.1 cm in diameter).
Recommend annual imaging followup by CTA or MRA. This recommendation
follows 7474 ACCF/AHA/AATS/ACR/ASA/SCA/AMAIRANI/PAULLO/KURBAN SEYIT/LEANDRO Guidelines
for the Diagnosis and Management of Patients with Thoracic Aortic
Disease. Circulation. 7474; 121: E266-e369. Aortic aneurysm NOS
(DCKLJ-WOR.3).

## 2022-06-06 ENCOUNTER — Ambulatory Visit: Payer: Medicare Other

## 2022-06-12 ENCOUNTER — Ambulatory Visit: Payer: Medicare Other | Attending: Internal Medicine | Admitting: *Deleted

## 2022-06-12 ENCOUNTER — Encounter: Payer: Self-pay | Admitting: *Deleted

## 2022-06-12 VITALS — BP 124/85 | HR 82 | Ht 63.0 in | Wt 173.0 lb

## 2022-06-12 DIAGNOSIS — R42 Dizziness and giddiness: Secondary | ICD-10-CM | POA: Insufficient documentation

## 2022-06-12 NOTE — Patient Instructions (Addendum)
Your physician recommends that you continue on your current medications as directed. Please refer to the Current Medication list given to you today. Your physician recommends that you schedule a follow-up appointment in: as planned 

## 2022-06-12 NOTE — Progress Notes (Signed)
Presents for nurse visit per [facility] message regarding complaints of dizziness after standing for 10 minutes. Patient drove self to office today. Denies chest pain. Reports SOB that she is seeing a pulmonologist for. Medications reviewed and orthostatic BP's done. Also took another BP after standing 10 minutes. Sent to provider for review. Scheduled appointment to see [place] Calix on 06/18/2022.

## 2022-06-18 ENCOUNTER — Ambulatory Visit: Payer: Medicare Other | Attending: Nurse Practitioner | Admitting: Nurse Practitioner

## 2022-06-18 ENCOUNTER — Telehealth: Payer: Self-pay | Admitting: Nurse Practitioner

## 2022-06-18 ENCOUNTER — Encounter: Payer: Self-pay | Admitting: Nurse Practitioner

## 2022-06-18 ENCOUNTER — Other Ambulatory Visit: Payer: Self-pay | Admitting: Nurse Practitioner

## 2022-06-18 ENCOUNTER — Ambulatory Visit (INDEPENDENT_AMBULATORY_CARE_PROVIDER_SITE_OTHER): Payer: Medicare Other

## 2022-06-18 VITALS — BP 100/70 | HR 82 | Ht 63.0 in | Wt 171.8 lb

## 2022-06-18 DIAGNOSIS — R42 Dizziness and giddiness: Secondary | ICD-10-CM

## 2022-06-18 DIAGNOSIS — I2510 Atherosclerotic heart disease of native coronary artery without angina pectoris: Secondary | ICD-10-CM

## 2022-06-18 DIAGNOSIS — J449 Chronic obstructive pulmonary disease, unspecified: Secondary | ICD-10-CM | POA: Diagnosis not present

## 2022-06-18 NOTE — Progress Notes (Unsigned)
Cardiology Office Note:    Date:  06/18/2022  ID:  Amanda Jennings, DOB [birth year], MRN [id number]  PCP:  [name], MD   [facility] Providers Cardiologist:  Nona Dell, MD     Referring MD: [name], MD   CC: Orthostatic dizziness  History of Present Illness:    Amanda Jennings is a 82 y.o. female with a hx of the following:   CAD Hyperlipidemia History of bradycardia Hypothyroidism GERD Asthma, COPD  Patient is a delightful 82 year old female with past medical history as mentioned above.  Coronary CTA with FFR in September 2022 revealed mild, nonobstructive CAD with coronary calcium score 52.  EF normal by echocardiogram.  Last seen by Dr. Diona Browner on Oct 16, 2021.  Was overall doing well from a cardiac perspective.  Denied any chest pain.  No medication changes were made.  Was told to follow-up in 1 year.  Patient recently contacted nurse triage through MyChart message regarding complaints of dizziness after standing for 10 minutes.  Drove herself to office last week.  Denied any chest pain and reported shortness of breath that she is seeing a pulmonologist for. Orthostatics performed in office were negative. Requested an appt to be seen by cardiology.   Today she presents for evaluation. Says these dizzy spells have been ongoing since 2019. Occur while standing up and moving and are relieved when sitting or laying down. Daughter who is a PMHNP thinks it may be POTS. States she has noticed her pulse going down to 40 at night and 70 during the day. She has been wearing compression stockings and increasing her salt intake. Working on staying more hydrated. Does have some shortness of breath with her symptoms, says she has an upcoming appt with a pulmonologist soon. Does admit to some sensations of room spinning with her symptoms, does have feeling like she is going to pass out, but denies LOC or syncope. Denies any chest pain, palpitations, syncope,  orthopnea, PND, swelling or significant weight changes, acute bleeding, or claudication.  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 Daughter is a Cabin crew.   Past Medical History:  Diagnosis Date   Arthritis    Asthma    Bradycardia    Cataracts, bilateral    Cholelithiasis    Coronary atherosclerosis of native coronary artery    Mild atherosclerosis   Depression    GERD (gastroesophageal reflux disease)    History of positive PPD    Hx of migraines    Hyperlipidemia    Hypothyroidism    Osteopenia     Past Surgical History:  Procedure Laterality Date   Bilateral tubal ligation     Cataract surgery     Colonic polyp resection      Current Medications: Current Meds  Medication Sig   acetaminophen (TYLENOL) 500 MG tablet Take 500 mg by mouth every 6 (six) hours as needed.   albuterol (PROVENTIL HFA;VENTOLIN HFA) 108 (90 Base) MCG/ACT inhaler Inhale into the lungs every 6 (six) hours as needed for wheezing or shortness of breath.   amLODipine (NORVASC) 2.5 MG tablet Take 1 tablet (2.5 mg total) by mouth daily.   Ascorbic Acid (VITAMIN C ER PO) Take 1 tablet by mouth daily.    Cholecalciferol (VITAMIN D3) 5000 UNITS CAPS Take 1 capsule by mouth daily.   Coenzyme Q10 (CO Q 10 PO) Take by mouth daily.    fexofenadine (ALLEGRA) 180 MG tablet Take 180 mg by  mouth daily.   fluticasone (FLOVENT HFA) 220 MCG/ACT inhaler Inhale 2 puffs into the lungs 2 (two) times daily.   furosemide (LASIX) 20 MG tablet Take 1 tablet (20 mg total) by mouth daily.   levothyroxine (SYNTHROID) 112 MCG tablet Take 112 mcg by mouth daily before breakfast.   liothyronine (CYTOMEL) 5 MCG tablet Take 5 mcg by mouth daily.   montelukast (SINGULAIR) 10 MG tablet Take 10 mg by mouth every morning.    Multiple Vitamins-Minerals (PRESERVISION AREDS 2 PO) Take 1 tablet by mouth 2 (two) times daily.   rosuvastatin (CRESTOR) 5 MG tablet Take 1 tablet (5 mg total) by mouth  daily.   traZODone (DESYREL) 50 MG tablet Take 50 mg by mouth at bedtime as needed.   UNKNOWN TO PATIENT 2 time monthly allergy injection - doesn't know the name or dose     Allergies:   Ciprofloxacin   Social History   Socioeconomic History   Marital status: Divorced    Spouse name: Not on file   Number of children: Not on file   Years of education: Not on file   Highest education level: Not on file  Occupational History   Occupation: RETIRED     Comment: NURSE  Tobacco Use   Smoking status: Never   Smokeless tobacco: Never  Substance and Sexual Activity   Alcohol use: No    Alcohol/week: 0.0 standard drinks of alcohol   Drug use: No   Sexual activity: Not on file  Other Topics Concern   Not on file  Social History Narrative   Not on file   Social Determinants of Health   Financial Resource Strain: Not on file  Food Insecurity: Not on file  Transportation Needs: Not on file  Physical Activity: Not on file  Stress: Not on file  Social Connections: Not on file     ROS:   Review of Systems  Constitutional: Negative.   HENT: Negative.    Eyes: Negative.   Respiratory:  Positive for shortness of breath. Negative for cough, hemoptysis, sputum production and wheezing.   Cardiovascular:  Positive for leg swelling. Negative for chest pain, palpitations, orthopnea, claudication and PND.  Gastrointestinal: Negative.   Genitourinary: Negative.   Musculoskeletal: Negative.   Skin: Negative.   Neurological:  Positive for dizziness. Negative for tingling, tremors, sensory change, speech change, focal weakness, seizures, loss of consciousness, weakness and headaches.  Endo/Heme/Allergies: Negative.   Psychiatric/Behavioral: Negative.      Please see the history of present illness.    All other systems reviewed and are negative.  EKGs/Labs/Other Studies Reviewed:    The following studies were reviewed today:   EKG:  EKG is not ordered today.    Echocardiogram on  03/14/2022:   1. Left ventricular ejection fraction, by estimation, is 55 to 60%. The  left ventricle has normal function. The left ventricle has no regional  wall motion abnormalities. Left ventricular diastolic parameters are  consistent with Grade I diastolic  dysfunction (impaired relaxation). The average left ventricular global  longitudinal strain is -19.8 %. The global longitudinal strain is normal.   2. Right ventricular systolic function is normal. The right ventricular  size is normal. There is normal pulmonary artery systolic pressure. The  estimated right ventricular systolic pressure is 40.1 mmHg.   3. The mitral valve is grossly normal. Trivial mitral valve  regurgitation.   4. The aortic valve is tricuspid. Aortic valve regurgitation is trivial.   5. Aortic dilatation noted. There is  mild dilatation of the ascending  aorta, measuring 38 mm.   6. The inferior vena cava is normal in size with greater than 50%  respiratory variability, suggesting right atrial pressure of 3 mmHg.   Comparison(s): No significant change from prior study. Prior images  reviewed side by side.  Carotid duplex (bilateral) on 04/26/2021:  Summary:  Right Carotid: There was no evidence of thrombus, dissection,  atherosclerotic                plaque or stenosis in the cervical carotid system.   Left Carotid: There was no evidence of thrombus, dissection,  atherosclerotic               plaque or stenosis in the cervical carotid system.   Vertebrals:  Bilateral vertebral arteries demonstrate antegrade flow.  Subclavians: Normal flow hemodynamics were seen in bilateral subclavian               arteries.   CCTA with FFR on 02/08/2021:  IMPRESSION: 1. Coronary calcium score of 52 (LAD 50, LCX 2). This was 32 percentile for age and sex matched control.   2. Normal coronary origin with right dominance.   3. LAD is a large vessel that has proximal calcified plaque, 0-24% stenosis. There is mid  stenosis 25-49%. FFR normal (0.93 to 0.87) .  4. CT FFR didn't show any significant stenosis.    CAD-RADS 2. Mild non-obstructive CAD (25-49%). Consider non-atherosclerotic causes of chest pain. Consider preventive therapy and risk factor modification.  IMPRESSION: 1.  Aortic Atherosclerosis (ICD10-I70.0). 2. 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 Recommend annual imaging followup by CTA or MRA. This recommendation follows 2010 ACCF/AHA/AATS/ACR/ASA/SCA/SCAI/SIR/STS/SVM Guidelines for the Diagnosis and Management of Patients with Thoracic Aortic Disease. Circulation. 2010; 121: N053-Z767. Aortic aneurysm NOS (ICD10-I71.9).  Lexiscan on 01/02/2016:  There was no ST segment deviation noted during stress. The study is normal. There are no perfusion defects consistent with prior infarction or current ischemia. This is a low risk study. The left ventricular ejection fraction is normal (55-65%).  Recent Labs: 12/10/2021: ALT 21 04/01/2022: BUN 19; Creatinine, Ser 0.79; Potassium 4.8; Sodium 140  Recent Lipid Panel    Component Value Date/Time   CHOL 156 12/10/2021 0943   TRIG 178 (H) 12/10/2021 0943   HDL 52 12/10/2021 0943   CHOLHDL 3.0 12/10/2021 0943   LDLCALC 74 12/10/2021 0943    Physical Exam:    VS:  BP 100/70   Pulse 82   Ht 5\' 3"  (1.6 m)   Wt 171 lb 12.8 oz (77.9 kg)   SpO2 97%   BMI 30.43 kg/m     Wt Readings from Last 3 Encounters:  06/18/22 171 lb 12.8 oz (77.9 kg)  06/12/22 173 lb (78.5 kg)  10/16/21 170 lb 6.4 oz (77.3 kg)     GEN: Well nourished, well developed in no acute distress HEENT: Normal NECK: No JVD; No carotid bruits CARDIAC: S1/S2, RRR, no murmurs, rubs, gallops; 2+ pulses RESPIRATORY:  Clear to auscultation without rales, wheezing or rhonchi  MUSCULOSKELETAL:  No edema; No deformity  SKIN: Warm and dry NEUROLOGIC:  Alert and oriented x 3 PSYCHIATRIC:  Normal affect   ASSESSMENT:    1. Coronary artery disease  involving native heart without angina pectoris, unspecified vessel or lesion type   2. Chronic obstructive pulmonary disease, unspecified COPD type (HCC)   3. Orthostatic dizziness    PLAN:    In order of problems listed above:  Mild, nonobstructive CAD Coronary CTA in 2022 showed coronary calcium score 52.  Proximal calcified plaque was noted along LAD around 0 to 24% stenosis.  There was mid stenosis of LAD at 25 to 49%, FFR was normal.  Was found to have mild, nonobstructive CAD. Stable with no anginal symptoms. No indication for ischemic evaluation.  Continue rosuvastatin and coenzyme Q 10. Heart healthy diet and regular cardiovascular exercise encouraged.   COPD Does have some shortness of breath occasionally. Echo unremarkable 02/2022.  Denies any recent worsening symptoms. Continue to follow-up with pulmonology as scheduled.  Orthostatic dizziness Recent negative orthostatics. Etiology unclear, however will r/o any cardiac arrhythmias by arranging monitor. Does not meet criteria for POTS diagnosis currently. May need to consider referral to [facility] for tilt table test in future and/or consider neurology referral if monitor is unremarkable. Continue compression stockings, slow position changes, and salt intake discussed. ED precautions discussed. Could be vertigo related, recommended discussing/trialing Meclizine medication PRN with her PCP.    4. Disposition: Follow-up with me or APP in 2-3 months or sooner if anything changes.    Medication Adjustments/Labs and Tests Ordered: Current medicines are reviewed at length with the patient today.  Concerns regarding medicines are outlined above.  No orders of the defined types were placed in this encounter.  No orders of the defined types were placed in this encounter.   Patient Instructions  Medication Instructions:  Your physician recommends that you continue on your current medications as directed. Please refer to the Current Medication  list given to you today. Meclizine over the counter recommended  Labwork: none  Testing/Procedures: Your physician has recommended that you wear a Zio monitor.   This monitor is a medical device that records the heart's electrical activity. Doctors most often use these monitors to diagnose arrhythmias. Arrhythmias are problems with the speed or rhythm of the heartbeat. The monitor is a small device applied to your chest. You can wear one while you do your normal daily activities. While wearing this monitor if you have any symptoms to push the button and record what you felt. Once you have worn this monitor for the period of time provider prescribed (for 14 days), you will return the monitor device in the postage paid box. Once it is returned they will download the data collected and provide [place] with a report which the provider will then review and we will call you with those results. Important tips:  Avoid showering during the first 24 hours of wearing the monitor. Avoid excessive sweating to help maximize wear time. Do not submerge the device, no hot tubs, and no swimming pools. Keep any lotions or oils away from the patch. After 24 hours you may shower with the patch on. Take brief showers with your back facing the shower head.  Do not remove patch once it has been placed because that will interrupt data and decrease adhesive wear time. Push the button when you have any symptoms and write down what you were feeling. Once you have completed wearing your monitor, remove and place into box which has postage paid and place in your outgoing mailbox.  If for some reason you have misplaced your box then call our office and we can provide another box and/or mail it off for you.  Follow-Up: Your physician recommends that you schedule a follow-up appointment in: 2-3 months  Any Other Special Instructions Will Be Listed Below (If Applicable).  If you need a refill on your cardiac medications before  your next appointment, please call your pharmacy.   SignedFinis Bud, NP  06/20/2022 12:45 PM    [place]

## 2022-06-18 NOTE — Telephone Encounter (Signed)
LONG TERM MONITOR   PERCERT

## 2022-06-18 NOTE — Patient Instructions (Addendum)
Medication Instructions:  Your physician recommends that you continue on your current medications as directed. Please refer to the Current Medication list given to you today. Meclizine over the counter recommended  Labwork: none  Testing/Procedures: Your physician has recommended that you wear a Zio monitor.   This monitor is a medical device that records the heart's electrical activity. Doctors most often use these monitors to diagnose arrhythmias. Arrhythmias are problems with the speed or rhythm of the heartbeat. The monitor is a small device applied to your chest. You can wear one while you do your normal daily activities. While wearing this monitor if you have any symptoms to push the button and record what you felt. Once you have worn this monitor for the period of time provider prescribed (for 14 days), you will return the monitor device in the postage paid box. Once it is returned they will download the data collected and provide [place] with a report which the provider will then review and we will call you with those results. Important tips:  Avoid showering during the first 24 hours of wearing the monitor. Avoid excessive sweating to help maximize wear time. Do not submerge the device, no hot tubs, and no swimming pools. Keep any lotions or oils away from the patch. After 24 hours you may shower with the patch on. Take brief showers with your back facing the shower head.  Do not remove patch once it has been placed because that will interrupt data and decrease adhesive wear time. Push the button when you have any symptoms and write down what you were feeling. Once you have completed wearing your monitor, remove and place into box which has postage paid and place in your outgoing mailbox.  If for some reason you have misplaced your box then call our office and we can provide another box and/or mail it off for you.  Follow-Up: Your physician recommends that you schedule a follow-up  appointment in: 2-3 months  Any Other Special Instructions Will Be Listed Below (If Applicable).  If you need a refill on your cardiac medications before your next appointment, please call your pharmacy.

## 2022-07-10 ENCOUNTER — Other Ambulatory Visit: Payer: Self-pay | Admitting: *Deleted

## 2022-07-10 ENCOUNTER — Telehealth: Payer: Self-pay | Admitting: Nurse Practitioner

## 2022-07-10 DIAGNOSIS — R42 Dizziness and giddiness: Secondary | ICD-10-CM

## 2022-07-10 DIAGNOSIS — R001 Bradycardia, unspecified: Secondary | ICD-10-CM

## 2022-07-10 NOTE — Telephone Encounter (Signed)
[employer] called to report patient had a total of 27 SVT episodes and had symptomatic bradycardia on page 13 & 14 with HR 39 bpm   Results already sent to provider for review.

## 2022-07-10 NOTE — Telephone Encounter (Signed)
Raquel Sarna calling with abnormal monitor results

## 2022-07-25 NOTE — Telephone Encounter (Signed)
Scheduled to see Mealor already on 09/08/2022

## 2022-08-19 ENCOUNTER — Encounter: Payer: Self-pay | Admitting: Cardiovascular Disease

## 2022-08-19 ENCOUNTER — Ambulatory Visit: Payer: Medicare Other | Attending: Cardiovascular Disease | Admitting: Cardiovascular Disease

## 2022-08-19 VITALS — BP 120/80 | HR 54 | Ht 63.0 in | Wt 170.0 lb

## 2022-08-19 DIAGNOSIS — R001 Bradycardia, unspecified: Secondary | ICD-10-CM

## 2022-08-19 NOTE — Patient Instructions (Addendum)
Medication Instructions:  Your physician recommends that you continue on your current medications as directed. Please refer to the Current Medication list given to you today. *If you need a refill on your cardiac medications before your next appointment, please call your pharmacy*   Testing/Procedures: Implantable Loop Recorder   Follow-Up: At [facility], you and your health needs are our priority.  As part of our continuing mission to provide you with exceptional heart care, we have created designated Provider Care Teams.  These Care Teams include your primary Cardiologist (physician) and Advanced Practice Providers (APPs -  Physician Assistants and Nurse Practitioners) who all work together to provide you with the care you need, when you need it.  We recommend signing up for the patient portal called "MyChart".  Sign up information is provided on this After Visit Summary.  MyChart is used to connect with patients for Virtual Visits (Telemedicine).  Patients are able to view lab/test results, encounter notes, upcoming appointments, etc.  Non-urgent messages can be sent to your provider as well.   To learn more about what you can do with MyChart, go to [email or web address].    Your next appointment:   At loop recorder implant  Provider:   Doralee Albino, MD   Implantable Loop Recorder Placement  An implantable loop recorder is a small electronic device that is placed under the skin of the chest. The device records the electrical activity of the heart over a long period of time. Your health care provider can download these recordings to monitor your heart. You may need an implantable loop recorder if you have periods of abnormal heart activity (arrhythmias) or unexplained fainting (syncope). The recorder can be left in place for as long as 3 years. Tell a health care provider about: Any allergies you have. All medicines you are taking, including vitamins, herbs, eye drops,  creams, and over-the-counter medicines. Any problems you or family members have had with anesthesia. Any bleeding problems you have. Any surgeries you have had. Any medical conditions you have. Whether you are pregnant or may be pregnant. What are the risks? Your health care provider will talk with you about risks. These may include: Infection. Bleeding. Allergic reactions to anesthesia. Damage to nerves or blood vessels. Failure of the device to work. This could require another surgery to replace it. What happens before the procedure? You may have a physical exam, blood tests, and imaging tests, such as a chest X-ray. Follow instructions from your health care provider about what you may eat and drink Ask your health care provider about: Changing or stopping your regular medicines. These include any diabetes medicines or blood thinners you take. Taking medicines such as aspirin and ibuprofen. These medicines can thin your blood. Do not take them unless your health care provider tells you to. Taking over-the-counter medicines, vitamins, herbs, and supplements. Ask your health care provider: How your surgery site will be marked. What steps will be taken to help prevent infection. These steps may include: Removing hair at the surgery site. Washing skin with a soap that kills germs. Taking antibiotics. If you will be going home right after the procedure, plan to have a responsible adult: Take you home from the hospital or clinic. You will not be allowed to drive. Do not use any products that contain nicotine or tobacco before the procedure. These products include cigarettes, chewing tobacco, and vaping devices, such as e-cigarettes. If you need help quitting, ask your health care provider What happens during the  procedure? An IV will be inserted into one of your veins. You may be given: A sedative. This helps you relax Anesthesia. This will: Numb certain areas of your body. Make you  fall asleep for surgery. A small incision will be made on the left side of your upper chest. A pocket will be created under your skin. The device will be placed in the pocket. The incision will be closed with stitches (sutures) or adhesive strips. A bandage (dressing) will be placed over the incision. The procedure may vary among health care providers and hospitals. What happens after the procedure? Your blood pressure, heart rate, breathing rate, and blood oxygen level will be monitored until you leave the hospital or clinic. You may be able to go home on the day of your surgery. Before you go home: Your health care provider will program your recorder. You will learn how to trigger your device with a handheld activator. You will learn how to send recordings to your health care provider. You will get an ID card for your device, and you will be told when to use it. This information is not intended to replace advice given to you by your health care provider. Make sure you discuss any questions you have with your health care provider. Document Revised: 10/01/2021 Document Reviewed: 10/01/2021 Elsevier Patient Education  [employer].

## 2022-08-19 NOTE — Progress Notes (Signed)
Electrophysiology Office Note:    Date:  08/19/2022   ID:  Amanda Jennings, DOB 1940-08-10, MRN [id number]  PCP:  Eliberto Ivory, MD   [facility] Cardiologist:  Rozann Lesches, MD Electrophysiologist:  Melida Quitter, MD     Referring MD: Finis Bud, NP   History of Present Illness:    Amanda Jennings is a 82 y.o. female with a hx listed below, significant for bradycardia, HTN, HLD, referred for arrhythmia management.  She has had periodic episodes of dizziness since 2019. They occur with standing and are relieved by sitting or laying down. She wears compression stockings and has increased her salt intake. She has also noted a sense of room spinning. She has not lost consciousness but felt like she has been close.  She wore a monitor that recorded multiple symptom episodes due to dizziness and lightheadedness during mild exertion -- eg. working in [employer], ironing close, cleaning house.  70s episodes were associated with bradycardia and heart rates of about 42 bpm.  However there were also episodes of lightheadedness associated with normal sinus rhythm and heart rates up to 89 bpm.  Events are also associated with shortness of breath and fatigue.  She has not had frank syncope, chest pain, room spinning, vertigo.  Past Medical History:  Diagnosis Date   Arthritis    Asthma    Bradycardia    Cataracts, bilateral    Cholelithiasis    Coronary atherosclerosis of native coronary artery    Mild atherosclerosis   Depression    GERD (gastroesophageal reflux disease)    History of positive PPD    Hx of migraines    Hyperlipidemia    Hypothyroidism    Osteopenia     Past Surgical History:  Procedure Laterality Date   Bilateral tubal ligation     Cataract surgery     Colonic polyp resection      Current Medications: Current Meds  Medication Sig   acetaminophen (TYLENOL) 500 MG tablet Take 500 mg by mouth every 6 (six) hours as needed.    albuterol (PROVENTIL HFA;VENTOLIN HFA) 108 (90 Base) MCG/ACT inhaler Inhale into the lungs every 6 (six) hours as needed for wheezing or shortness of breath.   amLODipine (NORVASC) 2.5 MG tablet Take 1 tablet (2.5 mg total) by mouth daily.   Ascorbic Acid (VITAMIN C ER PO) Take 1 tablet by mouth daily.    Cholecalciferol (VITAMIN D3) 5000 UNITS CAPS Take 1 capsule by mouth daily.   Coenzyme Q10 (CO Q 10 PO) Take by mouth daily.    fexofenadine (ALLEGRA) 180 MG tablet Take 180 mg by mouth daily.   fluticasone (FLOVENT HFA) 220 MCG/ACT inhaler Inhale 2 puffs into the lungs 2 (two) times daily.   furosemide (LASIX) 20 MG tablet Take 1 tablet (20 mg total) by mouth daily.   levothyroxine (SYNTHROID) 112 MCG tablet Take 112 mcg by mouth daily before breakfast.   liothyronine (CYTOMEL) 5 MCG tablet Take 5 mcg by mouth daily.   montelukast (SINGULAIR) 10 MG tablet Take 10 mg by mouth every morning.    Multiple Vitamins-Minerals (PRESERVISION AREDS 2 PO) Take 1 tablet by mouth 2 (two) times daily.   rosuvastatin (CRESTOR) 5 MG tablet Take 1 tablet (5 mg total) by mouth daily.   traZODone (DESYREL) 50 MG tablet Take 50 mg by mouth at bedtime as needed.   UNKNOWN TO PATIENT 2 time monthly allergy injection - doesn't know the name or dose  Allergies:   Ciprofloxacin   Social and Family History: Reviewed in Epic  ROS:   Please see the history of present illness.    All other systems reviewed and are negative.  EKGs/Labs/Other Studies Reviewed Today:    Echocardiogram:  TTE on 03/14/2022:   1. Left ventricular ejection fraction, by estimation, is 55 to 60%. The  left ventricle has normal function. The left ventricle has no regional  wall motion abnormalities. Left ventricular diastolic parameters are  consistent with Grade I diastolic  dysfunction (impaired relaxation). The average left ventricular global  longitudinal strain is -19.8 %. The global longitudinal strain is normal.   2. Right  ventricular systolic function is normal. The right ventricular  size is normal. There is normal pulmonary artery systolic pressure. The  estimated right ventricular systolic pressure is 0000000 mmHg.   3. The mitral valve is grossly normal. Trivial mitral valve  regurgitation.   4. The aortic valve is tricuspid. Aortic valve regurgitation is trivial.   5. Aortic dilatation noted. There is mild dilatation of the ascending  aorta, measuring 38 mm.   6. The inferior vena cava is normal in size with greater than 50%  respiratory variability, suggesting right atrial pressure of 3 mmHg.    Monitors:  Zio -- 13 days, 06/2022 Predominant rhythm is sinus with heart rate ranging from 37 bpm up to 106 bpm and average heart rate 51 bpm. Patient triggered events for lightheadedness and dizziness correlated with sinus rhythm with rates ranging from 42 bpm to 86 bpm   Multiple episodes of brief PSVT were noted, the longest of which lasted only 12 beats. Limited possible ectopic atrial rhythm was also noted, not associated with RVR. There were no pauses.  Stress testing:   Advanced imaging:  CCTA with FFR on 02/08/2021:  IMPRESSION: 1. Coronary calcium score of 52 (LAD 50, LCX 2). This was 62 percentile for age and sex matched control.   2. Normal coronary origin with right dominance.   3. LAD is a large vessel that has proximal calcified plaque, 0-24% stenosis. There is mid stenosis 25-49%. FFR normal (0.93 to 0.87) .   4. CT FFR didn't show any significant stenosis.    CAD-RADS 2. Mild non-obstructive CAD (25-49%). Consider non-atherosclerotic causes of chest pain. Consider preventive therapy and risk factor modification.   EKG:  Last EKG results: today -sinus rhythm   Recent Labs: 12/10/2021: ALT 21 04/01/2022: BUN 19; Creatinine, Ser 0.79; Potassium 4.8; Sodium 140     Physical Exam:    VS:  BP 120/80   Pulse (!) 54   Ht 5\' 3"  (1.6 m)   Wt 170 lb (77.1 kg)   SpO2 97%   BMI  30.11 kg/m     Wt Readings from Last 3 Encounters:  08/19/22 170 lb (77.1 kg)  06/18/22 171 lb 12.8 oz (77.9 kg)  06/12/22 173 lb (78.5 kg)     GEN: Well nourished, well developed in no acute distress CARDIAC: RRR, no murmurs, rubs, gallops RESPIRATORY:  Normal work of breathing MUSCULOSKELETAL: no edema    ASSESSMENT & PLAN:    Dizziness and presyncope Appears to be orthostatic rather than due to arrhythmia.  I suspect that the events associate with the bradycardia may be exacerbated by the bradycardia. I do not think the benefit from a pacemaker would be warranted by the risk. I think close monitoring is appropriate; I recommended loop recorder.  I explained the rationale and logistics of the procedure.  I  described the risks including minor infection and bleeding.  I explained the monitoring process with the included fee.  Bradycardia Monitor with loop recorder            Medication Adjustments/Labs and Tests Ordered: Current medicines are reviewed at length with the patient today.  Concerns regarding medicines are outlined above.  Orders Placed This Encounter  Procedures   EKG 12-Lead   No orders of the defined types were placed in this encounter.    Signed, Melida Quitter, MD  08/19/2022 12:10 PM    Portal

## 2022-08-20 ENCOUNTER — Ambulatory Visit: Payer: Medicare Other | Attending: Nurse Practitioner | Admitting: Nurse Practitioner

## 2022-08-20 ENCOUNTER — Encounter: Payer: Self-pay | Admitting: Nurse Practitioner

## 2022-08-20 VITALS — BP 128/64 | HR 57 | Ht 63.5 in | Wt 174.2 lb

## 2022-08-20 DIAGNOSIS — R42 Dizziness and giddiness: Secondary | ICD-10-CM | POA: Diagnosis present

## 2022-08-20 DIAGNOSIS — J449 Chronic obstructive pulmonary disease, unspecified: Secondary | ICD-10-CM

## 2022-08-20 DIAGNOSIS — I2510 Atherosclerotic heart disease of native coronary artery without angina pectoris: Secondary | ICD-10-CM | POA: Diagnosis present

## 2022-08-20 MED ORDER — MIDODRINE HCL 2.5 MG PO TABS: 2.5000 mg | ORAL_TABLET | Freq: Two times a day (BID) | ORAL | 1 refills | Status: DC

## 2022-08-20 NOTE — Patient Instructions (Addendum)
Medication Instructions:  Your physician has recommended you make the following change in your medication:  START midodrine 2.5 mg twice a day Continue all other medications as directed  Labwork: none  Testing/Procedures: none  Follow-Up:  Your physician recommends that you schedule a follow-up appointment in: 3-4 months  Any Other Special Instructions Will Be Listed Below (If Applicable).  If you need a refill on your cardiac medications before your next appointment, please call your pharmacy.

## 2022-08-20 NOTE — Progress Notes (Unsigned)
Cardiology Office Note:    Date:  08/20/2022  ID:  Amanda Jennings, DOB 1941-01-03, MRN [id number]  PCP:  Eliberto Ivory, MD   [facility] Cardiologist:  Rozann Lesches, MD Electrophysiologist:  Melida Quitter, MD     Referring MD: Eliberto Ivory, MD   CC: Orthostatic dizziness follow-up  History of Present Illness:    Amanda Jennings is a delightful 82 y.o. female with a hx of the following:   CAD Hyperlipidemia History of bradycardia Hypothyroidism GERD Asthma, COPD   Coronary CTA with FFR 01/2021 revealed mild, nonobstructive CAD with coronary calcium score 52.  EF normal by TTE.  Seen by Dr. Domenic Polite on Oct 16, 2021.  Was overall doing well from a cardiac perspective.  Denied any chest pain.    Patient contacted nurse triage through [facility] message regarding complaints of dizziness after standing for 10 minutes.  Drove herself to office.  Denied CP, reported shortness of breath - seeing pulmonology. Orthostatics performed in office were negative. Requested an appt to be seen by cardiology.   Last saw this patient 05/2022. Reported ongoing dizzy spells since 2019, occurred while standing up and moving, relieved when sitting or laying down. Noticed her pulse going down to 40 at night and 70 during the day. Was wearing compression stockings and increasing salt intake. Noted some shortness of breath with her symptoms, said she has an upcoming appt with a pulmonologist soon. Monitor arranged - report below.   Today she presents for follow-up. She reports her dizziness and breathing is stable. Says she is no longer taking Amlodipine per her report. Denies any chest pain, palpitations, syncope, presyncope, orthopnea, PND, swelling or significant weight changes, acute bleeding, or claudication. Continues to note DOE, stable. Following pulmonologist in [place], [place].    SH: Patient is a retired Therapist, sports with ED and ICU experience. Daughter is a Therapist, occupational.   Past Medical History:  Diagnosis Date   Arthritis    Asthma    Bradycardia    Cataracts, bilateral    Cholelithiasis    Coronary atherosclerosis of native coronary artery    Mild atherosclerosis   Depression    GERD (gastroesophageal reflux disease)    History of positive PPD    Hx of migraines    Hyperlipidemia    Hypothyroidism    Osteopenia     Past Surgical History:  Procedure Laterality Date   Bilateral tubal ligation     Cataract surgery     Colonic polyp resection      Current Medications: Current Meds  Medication Sig   acetaminophen (TYLENOL) 500 MG tablet Take 500 mg by mouth every 6 (six) hours as needed.   albuterol (PROVENTIL HFA;VENTOLIN HFA) 108 (90 Base) MCG/ACT inhaler Inhale into the lungs every 6 (six) hours as needed for wheezing or shortness of breath.   Ascorbic Acid (VITAMIN C ER PO) Take 1 tablet by mouth daily.    Cholecalciferol (VITAMIN D3) 5000 UNITS CAPS Take 1 capsule by mouth daily.   Coenzyme Q10 (CO Q 10 PO) Take by mouth daily.    fexofenadine (ALLEGRA) 180 MG tablet Take 180 mg by mouth daily.   fluticasone (FLOVENT HFA) 220 MCG/ACT inhaler Inhale 2 puffs into the lungs 2 (two) times daily.   furosemide (LASIX) 20 MG tablet Take 1 tablet (20 mg total) by mouth daily.   levothyroxine (SYNTHROID) 112 MCG tablet Take 112 mcg by mouth daily before breakfast.   liothyronine (CYTOMEL) 5  MCG tablet Take 5 mcg by mouth daily.   montelukast (SINGULAIR) 10 MG tablet Take 10 mg by mouth every morning.    Multiple Vitamins-Minerals (PRESERVISION AREDS 2 PO) Take 1 tablet by mouth 2 (two) times daily.   rosuvastatin (CRESTOR) 5 MG tablet Take 1 tablet (5 mg total) by mouth daily.   traZODone (DESYREL) 50 MG tablet Take 50 mg by mouth at bedtime as needed.   UNKNOWN TO PATIENT 2 time monthly allergy injection - doesn't know the name or dose     Allergies:   Ciprofloxacin   Social History   Socioeconomic History   Marital status:  Divorced    Spouse name: Not on file   Number of children: Not on file   Years of education: Not on file   Highest education level: Not on file  Occupational History   Occupation: RETIRED     Comment: NURSE  Tobacco Use   Smoking status: Never    Passive exposure: Never   Smokeless tobacco: Never  Substance and Sexual Activity   Alcohol use: No    Alcohol/week: 0.0 standard drinks of alcohol   Drug use: No   Sexual activity: Not on file  Other Topics Concern   Not on file  Social History Narrative   Not on file   Social Determinants of Health   Financial Resource Strain: Not on file  Food Insecurity: Not on file  Transportation Needs: Not on file  Physical Activity: Not on file  Stress: Not on file  Social Connections: Not on file     ROS:     Please see the history of present illness.    All other systems reviewed and are negative.  EKGs/Labs/Other Studies Reviewed:    The following studies were reviewed today:   EKG:  EKG is not ordered today.   Cardiac monitor 05/2022: Predominant rhythm is sinus with heart rate ranging from 37 bpm up to 106 bpm and average heart rate 51 bpm. There were rare PACs including atrial couplets and triplets representing less than 1% total beats. There were rare PVCs including ventricular couplets representing less than 1% total beats.  Also limited episodes of ventricular bigeminy. Multiple episodes of brief PSVT were noted, the longest of which lasted only 12 beats. Limited possible ectopic atrial rhythm was also noted, not associated with RVR. There were no pauses.  Echocardiogram on 03/14/2022:   1. Left ventricular ejection fraction, by estimation, is 55 to 60%. The  left ventricle has normal function. The left ventricle has no regional  wall motion abnormalities. Left ventricular diastolic parameters are  consistent with Grade I diastolic  dysfunction (impaired relaxation). The average left ventricular global  longitudinal  strain is -19.8 %. The global longitudinal strain is normal.   2. Right ventricular systolic function is normal. The right ventricular  size is normal. There is normal pulmonary artery systolic pressure. The  estimated right ventricular systolic pressure is 0000000 mmHg.   3. The mitral valve is grossly normal. Trivial mitral valve  regurgitation.   4. The aortic valve is tricuspid. Aortic valve regurgitation is trivial.   5. Aortic dilatation noted. There is mild dilatation of the ascending  aorta, measuring 38 mm.   6. The inferior vena cava is normal in size with greater than 50%  respiratory variability, suggesting right atrial pressure of 3 mmHg.   Comparison(s): No significant change from prior study. Prior images  reviewed side by side.  Carotid duplex (bilateral) on 04/26/2021:  Summary:  Right Carotid: There was no evidence of thrombus, dissection,  atherosclerotic                plaque or stenosis in the cervical carotid system.   Left Carotid: There was no evidence of thrombus, dissection,  atherosclerotic               plaque or stenosis in the cervical carotid system.   Vertebrals:  Bilateral vertebral arteries demonstrate antegrade flow.  Subclavians: Normal flow hemodynamics were seen in bilateral subclavian               arteries.   CCTA with FFR on 02/08/2021:  IMPRESSION: 1. Coronary calcium score of 52 (LAD 50, LCX 2). This was 60 percentile for age and sex matched control.   2. Normal coronary origin with right dominance.   3. LAD is a large vessel that has proximal calcified plaque, 0-24% stenosis. There is mid stenosis 25-49%. FFR normal (0.93 to 0.87) .  4. CT FFR didn't show any significant stenosis.    CAD-RADS 2. Mild non-obstructive CAD (25-49%). Consider non-atherosclerotic causes of chest pain. Consider preventive therapy and risk factor modification.  IMPRESSION: 1.  Aortic Atherosclerosis (ICD10-I70.0). 2. 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 Recommend annual imaging followup by CTA or MRA. This recommendation follows 2010 ACCF/AHA/AATS/ACR/ASA/SCA/SCAI/SIR/STS/SVM Guidelines for the Diagnosis and Management of Patients with Thoracic Aortic Disease. Circulation. 2010; 121ML:[id number]. Aortic aneurysm NOS (ICD10-I71.9).  Lexiscan on 01/02/2016:  There was no ST segment deviation noted during stress. The study is normal. There are no perfusion defects consistent with prior infarction or current ischemia. This is a low risk study. The left ventricular ejection fraction is normal (55-65%).  Recent Labs: 12/10/2021: ALT 21 04/01/2022: BUN 19; Creatinine, Ser 0.79; Potassium 4.8; Sodium 140  Recent Lipid Panel    Component Value Date/Time   CHOL 156 12/10/2021 0943   TRIG 178 (H) 12/10/2021 0943   HDL 52 12/10/2021 0943   CHOLHDL 3.0 12/10/2021 0943   LDLCALC 74 12/10/2021 0943    Physical Exam:    VS:  BP 128/64 (BP Location: Left Arm, Patient Position: Sitting, Cuff Size: Normal)   Pulse (!) 57   Ht 5' 3.5" (1.613 m)   Wt 174 lb 3.2 oz (79 kg)   SpO2 98%   BMI 30.37 kg/m     Wt Readings from Last 3 Encounters:  08/20/22 174 lb 3.2 oz (79 kg)  08/19/22 170 lb (77.1 kg)  06/18/22 171 lb 12.8 oz (77.9 kg)     GEN: Well nourished, well developed in no acute distress HEENT: Normal NECK: No JVD; No carotid bruits CARDIAC: S1/S2, RRR, no murmurs, rubs, gallops; 2+ pulses RESPIRATORY:  Clear to auscultation without rales, wheezing or rhonchi  MUSCULOSKELETAL: Minimal, nonpitting edema along BLE (wearing compression stockings), no deformity  SKIN: Warm and dry NEUROLOGIC:  Alert and oriented x 3 PSYCHIATRIC:  Normal affect   ASSESSMENT:    1. Coronary artery disease involving native heart without angina pectoris, unspecified vessel or lesion type   2. Chronic obstructive pulmonary disease, unspecified COPD type   3. Orthostatic dizziness     PLAN:    In order of problems listed  above:  Mild, nonobstructive CAD CCTA in 2022 showed coronary calcium score 52.  Proximal calcified plaque was noted along LAD around 0 to 24% stenosis.  There was mid stenosis of LAD at 25 to 49%, FFR normal.  Found to have mild,  nonobstructive CAD. Stable with no anginal symptoms. No indication for ischemic evaluation.  Continue rosuvastatin and coenzyme Q 10. Heart healthy diet and regular cardiovascular exercise encouraged.   COPD Does have some shortness of breath occasionally. Echo unremarkable 02/2022.  Denies any recent worsening symptoms. Continue to follow-up with pulmonology as scheduled.  Orthostatic dizziness Past negative orthostatics, however symptoms appear orthostatic rather than d/t arrhythmia per Dr. Karel Jarvis past note. Does not meet criteria for POTS diagnosis currently.  Continue compression stockings, slow position changes, and salt intake discussed. Will start low dose Midodrine 2.5 mg BID. ED precautions discussed. Could be vertigo related, recommended discussing/trialing Meclizine medication PRN with her PCP.    4. Disposition: Follow-up with me or APP in 3-4 months or sooner if anything changes.    Medication Adjustments/Labs and Tests Ordered: Current medicines are reviewed at length with the patient today.  Concerns regarding medicines are outlined above.  No orders of the defined types were placed in this encounter.  Meds ordered this encounter  Medications   midodrine (PROAMATINE) 2.5 MG tablet    Sig: Take 1 tablet (2.5 mg total) by mouth 2 (two) times daily with a meal.    Dispense:  180 tablet    Refill:  1    08/19/22 New Start    Patient Instructions  Medication Instructions:  Your physician has recommended you make the following change in your medication:  START midodrine 2.5 mg twice a day Continue all other medications as directed  Labwork: none  Testing/Procedures: none  Follow-Up:  Your physician recommends that you schedule a follow-up  appointment in: 3-4 months  Any Other Special Instructions Will Be Listed Below (If Applicable).  If you need a refill on your cardiac medications before your next appointment, please call your pharmacy.    SignedFinis Bud, NP  08/21/2022 7:37 PM    [place]

## 2022-10-02 ENCOUNTER — Other Ambulatory Visit: Payer: Self-pay | Admitting: Cardiology

## 2022-10-22 ENCOUNTER — Ambulatory Visit: Payer: Medicare Other | Admitting: Cardiology

## 2022-11-12 ENCOUNTER — Other Ambulatory Visit: Payer: Self-pay | Admitting: Cardiology

## 2022-12-16 ENCOUNTER — Ambulatory Visit: Payer: Medicare Other | Attending: Nurse Practitioner | Admitting: Nurse Practitioner

## 2022-12-16 ENCOUNTER — Encounter: Payer: Self-pay | Admitting: Nurse Practitioner

## 2022-12-16 VITALS — BP 128/92 | HR 76 | Ht 63.0 in | Wt 169.4 lb

## 2022-12-16 DIAGNOSIS — I2510 Atherosclerotic heart disease of native coronary artery without angina pectoris: Secondary | ICD-10-CM | POA: Insufficient documentation

## 2022-12-16 DIAGNOSIS — R42 Dizziness and giddiness: Secondary | ICD-10-CM | POA: Diagnosis present

## 2022-12-16 DIAGNOSIS — J449 Chronic obstructive pulmonary disease, unspecified: Secondary | ICD-10-CM | POA: Diagnosis present

## 2022-12-16 DIAGNOSIS — R9431 Abnormal electrocardiogram [ECG] [EKG]: Secondary | ICD-10-CM

## 2022-12-16 DIAGNOSIS — R9389 Abnormal findings on diagnostic imaging of other specified body structures: Secondary | ICD-10-CM | POA: Insufficient documentation

## 2022-12-16 DIAGNOSIS — E785 Hyperlipidemia, unspecified: Secondary | ICD-10-CM | POA: Diagnosis present

## 2022-12-16 MED ORDER — MIDODRINE HCL 2.5 MG PO TABS: 2.5000 mg | ORAL_TABLET | Freq: Two times a day (BID) | ORAL | 1 refills | Status: DC | PRN

## 2022-12-16 NOTE — Patient Instructions (Addendum)
Medication Instructions:  Your physician recommends that you continue on your current medications as directed. Please refer to the Current Medication list given to you today.  Labwork: None  Testing/Procedures: Your physician has requested that you have an echocardiogram. Echocardiography is a painless test that uses sound waves to create images of your heart. It provides your doctor with information about the size and shape of your heart and how well your heart's chambers and valves are working. This procedure takes approximately one hour. There are no restrictions for this procedure. Please do NOT wear cologne, perfume, aftershave, or lotions (deodorant is allowed). Please arrive 15 minutes prior to your appointment time.  Follow-Up: Your physician recommends that you schedule a follow-up appointment in: 6 Months with Philis Nettle   Any Other Special Instructions Will Be Listed Below (If Applicable).  If you need a refill on your cardiac medications before your next appointment, please call your pharmacy.

## 2022-12-16 NOTE — Progress Notes (Unsigned)
Cardiology Office Note:    Date:  12/16/2022 ID:  Amanda Jennings, DOB 1940-06-16, MRN [id number] PCP:  [name], MD [facility] Cardiologist:  Nona Dell, MD Electrophysiologist:  Maurice Small, MD    Referring MD: [name], MD  CC: Here for follow-up  History of Present Illness:    Amanda Jennings is a delightful 82 y.o. female with a PMH of mild, nonobstructive CAD, HLD, bradycardia, hypothyroidism, GERD, COPD, asthma, and orthostatic dizziness, who presents today for follow-up.   Coronary CTA with FFR 01/2021 revealed mild, nonobstructive CAD with coronary calcium score 52.  EF normal by TTE.  Today she presents for follow-up. Doing much better from last visit. She has increased her fluid intake, dizziness has improved - has not taken midodrine. Recently got back from a trip from [place], BP has been mildly elevated with SBP averaging 140's, has been taking 5 mg of Amlodipine.  Saw her pulmonologist in April 2024.  She shares with me her CT chest report that reveals mild centrilobular emphysema without lung mass, suspicious nodule, or other significant finding.  Cardiac findings included normal heart size with focal fat deposition in the left ventricular myocardium suspicious for prior MI.  Pulmonologist recommended repeating echocardiogram in 6 months for evaluation.  Overall she is doing well from a cardiac perspective. Denies any chest pain, palpitations, syncope, presyncope, orthopnea, PND, swelling or significant weight changes, acute bleeding, or claudication.   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 Daughter is a Cabin crew.   ROS:   Please see the history of present illness.    All other systems reviewed and are negative.  EKGs/Labs/Other Studies Reviewed:    The following studies were reviewed today: EKG:  EKG is not ordered today.   Cardiac monitor 05/2022: Predominant rhythm is sinus with heart  rate ranging from 37 bpm up to 106 bpm and average heart rate 51 bpm. There were rare PACs including atrial couplets and triplets representing less than 1% total beats. There were rare PVCs including ventricular couplets representing less than 1% total beats.  Also limited episodes of ventricular bigeminy. Multiple episodes of brief PSVT were noted, the longest of which lasted only 12 beats. Limited possible ectopic atrial rhythm was also noted, not associated with RVR. There were no pauses.  Echocardiogram on 03/14/2022:   1. Left ventricular ejection fraction, by estimation, is 55 to 60%. The  left ventricle has normal function. The left ventricle has no regional  wall motion abnormalities. Left ventricular diastolic parameters are  consistent with Grade I diastolic  dysfunction (impaired relaxation). The average left ventricular global  longitudinal strain is -19.8 %. The global longitudinal strain is normal.   2. Right ventricular systolic function is normal. The right ventricular  size is normal. There is normal pulmonary artery systolic pressure. The  estimated right ventricular systolic pressure is 29.0 mmHg.   3. The mitral valve is grossly normal. Trivial mitral valve  regurgitation.   4. The aortic valve is tricuspid. Aortic valve regurgitation is trivial.   5. Aortic dilatation noted. There is mild dilatation of the ascending  aorta, measuring 38 mm.   6. The inferior vena cava is normal in size with greater than 50%  respiratory variability, suggesting right atrial pressure of 3 mmHg.   Comparison(s): No significant change from prior study. Prior images  reviewed side by side.  Carotid duplex (bilateral) on 04/26/2021:  Summary:  Right Carotid: There  was no evidence of thrombus, dissection,  atherosclerotic                plaque or stenosis in the cervical carotid system.   Left Carotid: There was no evidence of thrombus, dissection,  atherosclerotic                plaque or stenosis in the cervical carotid system.   Vertebrals:  Bilateral vertebral arteries demonstrate antegrade flow.  Subclavians: Normal flow hemodynamics were seen in bilateral subclavian               arteries.   CCTA with FFR on 02/08/2021:  IMPRESSION: 1. Coronary calcium score of 52 (LAD 50, LCX 2). This was 32 percentile for age and sex matched control.   2. Normal coronary origin with right dominance.   3. LAD is a large vessel that has proximal calcified plaque, 0-24% stenosis. There is mid stenosis 25-49%. FFR normal (0.93 to 0.87) .  4. CT FFR didn't show any significant stenosis.    CAD-RADS 2. Mild non-obstructive CAD (25-49%). Consider non-atherosclerotic causes of chest pain. Consider preventive therapy and risk factor modification.  IMPRESSION: 1.  Aortic Atherosclerosis (ICD10-I70.0). 2. 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 Recommend annual imaging followup by CTA or MRA. This recommendation follows 2010 ACCF/AHA/AATS/ACR/ASA/SCA/SCAI/SIR/STS/SVM Guidelines for the Diagnosis and Management of Patients with Thoracic Aortic Disease. Circulation. 2010; 121: Q229-N989. Aortic aneurysm NOS (ICD10-I71.9).  Lexiscan on 01/02/2016:  There was no ST segment deviation noted during stress. The study is normal. There are no perfusion defects consistent with prior infarction or current ischemia. This is a low risk study. The left ventricular ejection fraction is normal (55-65%).  Physical Exam:    VS:  BP (!) 128/92 (BP Location: Left Arm, Patient Position: Sitting, Cuff Size: Normal)   Pulse 76   Ht 5\' 3"  (1.6 m)   Wt 169 lb 6.4 oz (76.8 kg)   SpO2 98%   BMI 30.01 kg/m     Wt Readings from Last 3 Encounters:  12/16/22 169 lb 6.4 oz (76.8 kg)  08/20/22 174 lb 3.2 oz (79 kg)  08/19/22 170 lb (77.1 kg)     GEN: Well nourished, well developed in no acute distress HEENT: Normal NECK: No JVD; No carotid bruits CARDIAC: S1/S2, RRR, no  murmurs, rubs, gallops; 2+ pulses RESPIRATORY:  Clear to auscultation without rales, wheezing or rhonchi  MUSCULOSKELETAL: No edema along BLE, no deformity  SKIN: Warm and dry NEUROLOGIC:  Alert and oriented x 3 PSYCHIATRIC:  Normal affect   Assessment and Plan:    In order of problems listed above:  Mild, nonobstructive CAD, HLD CCTA in 2022 showed coronary calcium score 52.  Proximal calcified plaque was noted along LAD around 0 to 24% stenosis.  There was mid stenosis of LAD at 25 to 49%, FFR normal.  Found to have mild, nonobstructive CAD. Stable with no anginal symptoms. No indication for ischemic evaluation.  Continue rosuvastatin and coenzyme Q 10. Heart healthy diet and regular cardiovascular exercise encouraged. Last LDL 112, has upcoming labs with PCP. If LDL is not improved, plan to discuss Zetia/other options at next OV. Cannot tolerate increased dose of statin.  COPD Denies any recent symptoms. Echo unremarkable 02/2022.  Continue to follow-up with pulmonology as scheduled.  Orthostatic dizziness Doing much better after increasing her fluid intake, etiology most likely d/t dehydration. Has not taken Midodrine. Will change Midodrine to 2.5 mg BID PRN. Continue compression stockings, slow  position changes, and salt intake discussed. ED precautions discussed.   4. Abnormal CT scan Recent CT of her chest arranged by her pulmonologist revealed a focal fat deposition in the left ventricular myocardium suspicious for prior MI.  Patient feels fine and is asymptomatic.  States pulmonologist is recommending echocardiogram to be arranged in 6 months for further evaluation.  Will arrange this per pulmonologist request and to evaluate for any wall motion abnormalities. Will also route note to Dr. Diona Browner for recs regarding cardiac MRI.   4. Disposition: Follow-up with me or APP in 6 months or sooner if anything changes.    Medication Adjustments/Labs and Tests Ordered: Current medicines  are reviewed at length with the patient today.  Concerns regarding medicines are outlined above.  Orders Placed This Encounter  Procedures   ECHOCARDIOGRAM COMPLETE   Meds ordered this encounter  Medications   midodrine (PROAMATINE) 2.5 MG tablet    Sig: Take 1 tablet (2.5 mg total) by mouth 2 (two) times daily as needed.    Dispense:  180 tablet    Refill:  1    08/19/22 New Start    Patient Instructions  Medication Instructions:  Your physician recommends that you continue on your current medications as directed. Please refer to the Current Medication list given to you today.  Labwork: None  Testing/Procedures: Your physician has requested that you have an echocardiogram. Echocardiography is a painless test that uses sound waves to create images of your heart. It provides your doctor with information about the size and shape of your heart and how well your heart's chambers and valves are working. This procedure takes approximately one hour. There are no restrictions for this procedure. Please do NOT wear cologne, perfume, aftershave, or lotions (deodorant is allowed). Please arrive 15 minutes prior to your appointment time.  Follow-Up: Your physician recommends that you schedule a follow-up appointment in: 6 Months with Philis Nettle   Any Other Special Instructions Will Be Listed Below (If Applicable).  If you need a refill on your cardiac medications before your next appointment, please call your pharmacy.   Signed, Sharlene Dory, NP  12/17/2022 5:06 AM    [facility]

## 2023-03-05 ENCOUNTER — Telehealth: Payer: Self-pay | Admitting: Cardiology

## 2023-03-05 NOTE — Telephone Encounter (Signed)
Reports she has been SOB for awhile and has some swelling in lower legs. Reports she has dizziness all the time that she associates with "old age". Denies chest pain. Gave first available appointment to see Philis Nettle on 03/10/2023 @9 :00 am. PCP notes are available for review.

## 2023-03-05 NOTE — Telephone Encounter (Signed)
  Pt stated, she saw her pcp and her doctor heard Crackles in her lungs and was advised to call Dr. Diona Browner because she might having heart failure. She was prescribed lasix and she is currently taking it

## 2023-03-10 ENCOUNTER — Ambulatory Visit: Payer: Medicare Other | Attending: Nurse Practitioner | Admitting: Nurse Practitioner

## 2023-03-10 ENCOUNTER — Encounter: Payer: Self-pay | Admitting: Nurse Practitioner

## 2023-03-10 VITALS — BP 138/70 | HR 62 | Ht 63.5 in | Wt 174.4 lb

## 2023-03-10 DIAGNOSIS — R001 Bradycardia, unspecified: Secondary | ICD-10-CM

## 2023-03-10 DIAGNOSIS — J449 Chronic obstructive pulmonary disease, unspecified: Secondary | ICD-10-CM | POA: Diagnosis present

## 2023-03-10 DIAGNOSIS — R9389 Abnormal findings on diagnostic imaging of other specified body structures: Secondary | ICD-10-CM | POA: Insufficient documentation

## 2023-03-10 DIAGNOSIS — R0602 Shortness of breath: Secondary | ICD-10-CM

## 2023-03-10 DIAGNOSIS — I2510 Atherosclerotic heart disease of native coronary artery without angina pectoris: Secondary | ICD-10-CM | POA: Diagnosis present

## 2023-03-10 DIAGNOSIS — R600 Localized edema: Secondary | ICD-10-CM | POA: Diagnosis present

## 2023-03-10 DIAGNOSIS — E785 Hyperlipidemia, unspecified: Secondary | ICD-10-CM | POA: Insufficient documentation

## 2023-03-10 DIAGNOSIS — R0609 Other forms of dyspnea: Secondary | ICD-10-CM

## 2023-03-10 DIAGNOSIS — I25119 Atherosclerotic heart disease of native coronary artery with unspecified angina pectoris: Secondary | ICD-10-CM

## 2023-03-10 NOTE — Progress Notes (Unsigned)
Cardiology Office Note:    Date:  12/16/2022 ID:  Amanda Jennings, DOB 05/28/1940, MRN [id number] PCP:  [name], MD [facility] Providers Cardiologist:  Nona Dell, MD Electrophysiologist:  Maurice Small, MD    Referring MD: [name], MD  CC: Here for follow-up  History of Present Illness:    Amanda Jennings is a delightful 82 y.o. female with a PMH of mild, nonobstructive CAD, HLD, bradycardia, hypothyroidism, GERD, COPD, asthma, and orthostatic dizziness, who presents today for follow-up.   Coronary CTA with FFR 01/2021 revealed mild, nonobstructive CAD with coronary calcium score 52.  EF normal by TTE.  Today she presents for follow-up. Doing much better from last visit. She has increased her fluid intake, dizziness has improved - has not taken midodrine. Recently got back from a trip from [place], BP has been mildly elevated with SBP averaging 140's, has been taking 5 mg of Amlodipine.  Saw her pulmonologist in April 2024.  She shares with me her CT chest report that reveals mild centrilobular emphysema without lung mass, suspicious nodule, or other significant finding.  Cardiac findings included normal heart size with focal fat deposition in the left ventricular myocardium suspicious for prior MI.  Pulmonologist recommended repeating echocardiogram in 6 months for evaluation.  Overall she is doing well from a cardiac perspective. Denies any chest pain, palpitations, syncope, presyncope, orthopnea, PND, swelling or significant weight changes, acute bleeding, or claudication.   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 Daughter is a Cabin crew.   ROS:   Please see the history of present illness.    All other systems reviewed and are negative.  EKGs/Labs/Other Studies Reviewed:    The following studies were reviewed today: EKG:  EKG is not ordered today.   Cardiac monitor 05/2022: Predominant rhythm is sinus with heart  rate ranging from 37 bpm up to 106 bpm and average heart rate 51 bpm. There were rare PACs including atrial couplets and triplets representing less than 1% total beats. There were rare PVCs including ventricular couplets representing less than 1% total beats.  Also limited episodes of ventricular bigeminy. Multiple episodes of brief PSVT were noted, the longest of which lasted only 12 beats. Limited possible ectopic atrial rhythm was also noted, not associated with RVR. There were no pauses.  Echocardiogram on 03/14/2022:   1. Left ventricular ejection fraction, by estimation, is 55 to 60%. The  left ventricle has normal function. The left ventricle has no regional  wall motion abnormalities. Left ventricular diastolic parameters are  consistent with Grade I diastolic  dysfunction (impaired relaxation). The average left ventricular global  longitudinal strain is -19.8 %. The global longitudinal strain is normal.   2. Right ventricular systolic function is normal. The right ventricular  size is normal. There is normal pulmonary artery systolic pressure. The  estimated right ventricular systolic pressure is 29.0 mmHg.   3. The mitral valve is grossly normal. Trivial mitral valve  regurgitation.   4. The aortic valve is tricuspid. Aortic valve regurgitation is trivial.   5. Aortic dilatation noted. There is mild dilatation of the ascending  aorta, measuring 38 mm.   6. The inferior vena cava is normal in size with greater than 50%  respiratory variability, suggesting right atrial pressure of 3 mmHg.   Comparison(s): No significant change from prior study. Prior images  reviewed side by side.  Carotid duplex (bilateral) on 04/26/2021:  Summary:  Right Carotid: There  was no evidence of thrombus, dissection,  atherosclerotic                plaque or stenosis in the cervical carotid system.   Left Carotid: There was no evidence of thrombus, dissection,  atherosclerotic                plaque or stenosis in the cervical carotid system.   Vertebrals:  Bilateral vertebral arteries demonstrate antegrade flow.  Subclavians: Normal flow hemodynamics were seen in bilateral subclavian               arteries.   CCTA with FFR on 02/08/2021:  IMPRESSION: 1. Coronary calcium score of 52 (LAD 50, LCX 2). This was 32 percentile for age and sex matched control.   2. Normal coronary origin with right dominance.   3. LAD is a large vessel that has proximal calcified plaque, 0-24% stenosis. There is mid stenosis 25-49%. FFR normal (0.93 to 0.87) .  4. CT FFR didn't show any significant stenosis.    CAD-RADS 2. Mild non-obstructive CAD (25-49%). Consider non-atherosclerotic causes of chest pain. Consider preventive therapy and risk factor modification.  IMPRESSION: 1.  Aortic Atherosclerosis (ICD10-I70.0). 2. 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 Recommend annual imaging followup by CTA or MRA. This recommendation follows 2010 ACCF/AHA/AATS/ACR/ASA/SCA/SCAI/SIR/STS/SVM Guidelines for the Diagnosis and Management of Patients with Thoracic Aortic Disease. Circulation. 2010; 121: Z610-R604. Aortic aneurysm NOS (ICD10-I71.9).  Lexiscan on 01/02/2016:  There was no ST segment deviation noted during stress. The study is normal. There are no perfusion defects consistent with prior infarction or current ischemia. This is a low risk study. The left ventricular ejection fraction is normal (55-65%).  Physical Exam:    VS:  There were no vitals taken for this visit.    Wt Readings from Last 3 Encounters:  12/16/22 169 lb 6.4 oz (76.8 kg)  08/20/22 174 lb 3.2 oz (79 kg)  08/19/22 170 lb (77.1 kg)     GEN: Well nourished, well developed in no acute distress HEENT: Normal NECK: No JVD; No carotid bruits CARDIAC: S1/S2, RRR, no murmurs, rubs, gallops; 2+ pulses RESPIRATORY:  Clear to auscultation without rales, wheezing or rhonchi  MUSCULOSKELETAL: No edema  along BLE, no deformity  SKIN: Warm and dry NEUROLOGIC:  Alert and oriented x 3 PSYCHIATRIC:  Normal affect   Assessment and Plan:    In order of problems listed above:  Mild, nonobstructive CAD, HLD CCTA in 2022 showed coronary calcium score 52.  Proximal calcified plaque was noted along LAD around 0 to 24% stenosis.  There was mid stenosis of LAD at 25 to 49%, FFR normal.  Found to have mild, nonobstructive CAD. Stable with no anginal symptoms. No indication for ischemic evaluation.  Continue rosuvastatin and coenzyme Q 10. Heart healthy diet and regular cardiovascular exercise encouraged. Last LDL 112, has upcoming labs with PCP. If LDL is not improved, plan to discuss Zetia/other options at next OV. Cannot tolerate increased dose of statin.  COPD Denies any recent symptoms. Echo unremarkable 02/2022.  Continue to follow-up with pulmonology as scheduled.  Orthostatic dizziness Doing much better after increasing her fluid intake, etiology most likely d/t dehydration. Has not taken Midodrine. Will change Midodrine to 2.5 mg BID PRN. Continue compression stockings, slow position changes, and salt intake discussed. ED precautions discussed.   4. Abnormal CT scan Recent CT of her chest arranged by her pulmonologist revealed a focal fat deposition in the left ventricular myocardium suspicious  for prior MI.  Patient feels fine and is asymptomatic.  States pulmonologist is recommending echocardiogram to be arranged in 6 months for further evaluation.  Will arrange this per pulmonologist request and to evaluate for any wall motion abnormalities. Will also route note to Dr. Diona Browner for recs regarding cardiac MRI.   4. Disposition: Follow-up with me or APP in 6 months or sooner if anything changes.    Medication Adjustments/Labs and Tests Ordered: Current medicines are reviewed at length with the patient today.  Concerns regarding medicines are outlined above.  No orders of the defined types were  placed in this encounter.  No orders of the defined types were placed in this encounter.   There are no Patient Instructions on file for this visit.   Signed, Sharlene Dory, NP  03/10/2023 8:33 AM    [facility]

## 2023-03-10 NOTE — Patient Instructions (Addendum)
Medication Instructions:  Your physician recommends that you continue on your current medications as directed. Please refer to the Current Medication list given to you today.  Labwork: None   Testing/Procedures: Your physician has requested that you have an echocardiogram. Echocardiography is a painless test that uses sound waves to create images of your heart. It provides your doctor with information about the size and shape of your heart and how well your heart's chambers and valves are working. This procedure takes approximately one hour. There are no restrictions for this procedure. Please do NOT wear cologne, perfume, aftershave, or lotions (deodorant is allowed). Please arrive 15 minutes prior to your appointment time.  Follow-Up: Your physician recommends that you schedule a follow-up appointment in: 8 weeks   Any Other Special Instructions Will Be Listed Below (If Applicable).  If you need a refill on your cardiac medications before your next appointment, please call your pharmacy.

## 2023-03-15 ENCOUNTER — Other Ambulatory Visit: Payer: Self-pay | Admitting: Student

## 2023-03-17 MED ORDER — AMLODIPINE BESYLATE 5 MG PO TABS: 5.0000 mg | ORAL_TABLET | Freq: Every day | ORAL | 2 refills | Status: DC

## 2023-03-24 ENCOUNTER — Ambulatory Visit: Payer: Medicare Other | Attending: Nurse Practitioner

## 2023-03-24 DIAGNOSIS — R600 Localized edema: Secondary | ICD-10-CM | POA: Diagnosis present

## 2023-03-24 LAB — ECHOCARDIOGRAM COMPLETE
AR max vel: 2.21 cm2
AV Area VTI: 2.32 cm2
AV Area mean vel: 2.48 cm2
AV Mean grad: 4 mm[Hg]
AV Peak grad: 7.4 mm[Hg]
Ao pk vel: 1.36 m/s
Area-P 1/2: 2.46 cm2
Calc EF: 66.9 %
MV VTI: 1.49 cm2
S' Lateral: 2.6 cm
Single Plane A2C EF: 67.6 %
Single Plane A4C EF: 66.8 %

## 2023-03-27 ENCOUNTER — Telehealth: Payer: Self-pay | Admitting: Cardiology

## 2023-03-27 NOTE — Telephone Encounter (Signed)
Patient verbalized understanding  

## 2023-03-27 NOTE — Telephone Encounter (Signed)
Patient is returning call to discuss echo results. °

## 2023-05-05 ENCOUNTER — Encounter: Payer: Self-pay | Admitting: Nurse Practitioner

## 2023-05-05 ENCOUNTER — Ambulatory Visit: Payer: Medicare Other | Attending: Nurse Practitioner | Admitting: Nurse Practitioner

## 2023-05-05 VITALS — BP 128/80 | HR 60 | Ht 63.0 in | Wt 172.0 lb

## 2023-05-05 DIAGNOSIS — J449 Chronic obstructive pulmonary disease, unspecified: Secondary | ICD-10-CM | POA: Diagnosis not present

## 2023-05-05 DIAGNOSIS — E785 Hyperlipidemia, unspecified: Secondary | ICD-10-CM | POA: Diagnosis not present

## 2023-05-05 DIAGNOSIS — R600 Localized edema: Secondary | ICD-10-CM | POA: Diagnosis not present

## 2023-05-05 DIAGNOSIS — I25119 Atherosclerotic heart disease of native coronary artery with unspecified angina pectoris: Secondary | ICD-10-CM

## 2023-05-05 NOTE — Progress Notes (Signed)
Cardiology Office Note:    Date:  05/05/2023 ID:  Amanda Jennings, DOB 04/01/1941, MRN [id number] PCP:  [name], MD [facility] Providers Cardiologist:  Nona Dell, MD Electrophysiologist:  Maurice Small, MD    Referring MD: [name], MD  CC: Here for follow-up  History of Present Illness:    Amanda Jennings is a delightful 82 y.o. female with a PMH of mild, nonobstructive CAD, HLD, bradycardia, hypothyroidism, GERD, COPD, asthma, OSA, and orthostatic dizziness, who presents today for follow-up.   Coronary CTA with FFR 01/2021 revealed mild, nonobstructive CAD with coronary calcium score 52.  EF normal by TTE.  Last seen for follow-up on December 16, 2022. Was doing much better from last visit. Had increased her fluid intake, dizziness had improved - had not taken midodrine. Recently got back from a trip from [place], BP had been mildly elevated with SBP averaging 140's, had been taking 5 mg of Amlodipine.  Saw her pulmonologist in April 2024.  She shared with me her CT chest report that reveals mild centrilobular emphysema without lung mass, suspicious nodule, or other significant finding.  Cardiac findings included normal heart size with focal fat deposition in the left ventricular myocardium suspicious for prior MI.  Pulmonologist recommended repeating echocardiogram in 6 months for evaluation.  Overall she was doing well from a cardiac perspective. Denied any chest pain, palpitations, syncope, presyncope, orthopnea, PND, swelling or significant weight changes, acute bleeding, or claudication.   Today she presents for follow-up. Doing well.  Says she is breathing better after her provider has recently switched her inhaler.  Denies any chest pain, shortness of breath, palpitations, syncope, presyncope, dizziness, orthopnea, PND, swelling or significant weight changes, acute bleeding, or claudication.  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   ROS:   Please see the history of present illness.    All other systems reviewed and are negative.  EKGs/Labs/Other Studies Reviewed:    The following studies were reviewed today: EKG:  EKG is not ordered today.   Echo 03/2023: 1. Left ventricular ejection fraction, by estimation, is 60 to 65%. The  left ventricle has normal function. The left ventricle has no regional  wall motion abnormalities. Left ventricular diastolic parameters are  consistent with Grade I diastolic  dysfunction (impaired relaxation).   2. Right ventricular systolic function is normal. The right ventricular  size is normal. There is normal pulmonary artery systolic pressure. The estimated right ventricular systolic pressure is 35.7 mmHg.   3. The mitral valve is grossly normal. Trivial mitral valve  regurgitation.   4. The aortic valve is tricuspid. Aortic valve regurgitation is trivial.  No aortic stenosis is present. Aortic valve mean gradient measures 4.0  mmHg.   5. Aortic dilatation noted. There is mild dilatation of the ascending  aorta, measuring 39 mm.   6. The inferior vena cava is normal in size with greater than 50%  respiratory variability, suggesting right atrial pressure of 3 mmHg.   Comparison(s): No significant change from prior study.   Cardiac monitor 05/2022: Predominant rhythm is sinus with heart rate ranging from 37 bpm up to 106 bpm and average heart rate 51 bpm. There were rare PACs including atrial couplets and triplets representing less than 1% total beats. There were rare PVCs including ventricular couplets representing less than 1% total beats.  Also limited episodes of ventricular bigeminy. Multiple episodes of brief PSVT were noted,  the longest of which lasted only 12 beats. Limited possible ectopic atrial rhythm was also noted, not associated with RVR. There were no pauses.  Echocardiogram on 03/14/2022:   1. Left ventricular ejection  fraction, by estimation, is 55 to 60%. The  left ventricle has normal function. The left ventricle has no regional  wall motion abnormalities. Left ventricular diastolic parameters are  consistent with Grade I diastolic  dysfunction (impaired relaxation). The average left ventricular global  longitudinal strain is -19.8 %. The global longitudinal strain is normal.   2. Right ventricular systolic function is normal. The right ventricular  size is normal. There is normal pulmonary artery systolic pressure. The  estimated right ventricular systolic pressure is 29.0 mmHg.   3. The mitral valve is grossly normal. Trivial mitral valve  regurgitation.   4. The aortic valve is tricuspid. Aortic valve regurgitation is trivial.   5. Aortic dilatation noted. There is mild dilatation of the ascending  aorta, measuring 38 mm.   6. The inferior vena cava is normal in size with greater than 50%  respiratory variability, suggesting right atrial pressure of 3 mmHg.   Comparison(s): No significant change from prior study. Prior images  reviewed side by side.  Carotid duplex (bilateral) on 04/26/2021:  Summary:  Right Carotid: There was no evidence of thrombus, dissection,  atherosclerotic                plaque or stenosis in the cervical carotid system.   Left Carotid: There was no evidence of thrombus, dissection,  atherosclerotic               plaque or stenosis in the cervical carotid system.   Vertebrals:  Bilateral vertebral arteries demonstrate antegrade flow.  Subclavians: Normal flow hemodynamics were seen in bilateral subclavian               arteries.   CCTA with FFR on 02/08/2021:  IMPRESSION: 1. Coronary calcium score of 52 (LAD 50, LCX 2). This was 32 percentile for age and sex matched control.   2. Normal coronary origin with right dominance.   3. LAD is a large vessel that has proximal calcified plaque, 0-24% stenosis. There is mid stenosis 25-49%. FFR normal (0.93 to 0.87)  .  4. CT FFR didn't show any significant stenosis.    CAD-RADS 2. Mild non-obstructive CAD (25-49%). Consider non-atherosclerotic causes of chest pain. Consider preventive therapy and risk factor modification.  IMPRESSION: 1.  Aortic Atherosclerosis (ICD10-I70.0). 2. 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 Recommend annual imaging followup by CTA or MRA. This recommendation follows 2010 ACCF/AHA/AATS/ACR/ASA/SCA/SCAI/SIR/STS/SVM Guidelines for the Diagnosis and Management of Patients with Thoracic Aortic Disease. Circulation. 2010; 121: Z610-R604. Aortic aneurysm NOS (ICD10-I71.9).  Lexiscan on 01/02/2016:  There was no ST segment deviation noted during stress. The study is normal. There are no perfusion defects consistent with prior infarction or current ischemia. This is a low risk study. The left ventricular ejection fraction is normal (55-65%).  Physical Exam:    VS:  BP 128/80   Pulse 60   Ht 5\' 3"  (1.6 m)   Wt 172 lb (78 kg)   SpO2 98%   BMI 30.47 kg/m     Wt Readings from Last 3 Encounters:  05/05/23 172 lb (78 kg)  03/10/23 174 lb 6.4 oz (79.1 kg)  12/16/22 169 lb 6.4 oz (76.8 kg)     GEN: Well nourished, well developed in no acute distress HEENT: Normal NECK: No  JVD; No carotid bruits CARDIAC: S1/S2, RRR, no murmurs, rubs, gallops; 2+ pulses RESPIRATORY:  Clear to auscultation without rales, wheezing or rhonchi  MUSCULOSKELETAL: UTA edema along BLE as she is wearing bilateral compression stockings, no deformity  SKIN: Warm and dry NEUROLOGIC:  Alert and oriented x 3 PSYCHIATRIC:  Normal affect   Assessment and Plan:    In order of problems listed above:  Mild, nonobstructive CAD, HLD Stable with no anginal symptoms. No indication for ischemic evaluation.  CCTA in 2022 showed coronary calcium score 52.  Proximal calcified plaque was noted along LAD around 0 to 24% stenosis.  There was mild stenosis of LAD at 25 to 49%, FFR normal.   Found to have mild, nonobstructive CAD. Stable with no anginal symptoms.  Continue rosuvastatin and coenzyme Q 10. Heart healthy diet and regular cardiovascular exercise encouraged. Last LDL 103, continue current medication regimen as she cannot tolerate increased dose of statin.  Patient is requesting to redraw FLP/LFT as she has not been taking her Crestor consistently.  Will obtain this for her.  COPD Denies any recent symptoms. Echo unremarkable 02/2022.  Continue to follow-up with pulmonology as scheduled.  Leg edema Stable on exam. Continue Lasix. Low-salt, heart healthy diet encouraged. Leg elevation as needed encouraged.   Disposition: Follow-up with me or APP in 6 months or sooner if anything changes.    Medication Adjustments/Labs and Tests Ordered: Current medicines are reviewed at length with the patient today.  Concerns regarding medicines are outlined above.  Orders Placed This Encounter  Procedures   Lipid Profile   Hepatic function panel   No orders of the defined types were placed in this encounter.   Patient Instructions  Medication Instructions:  Your physician recommends that you continue on your current medications as directed. Please refer to the Current Medication list given to you today.  Labwork: Tomorrow   Testing/Procedures: None   Follow-Up: Your physician recommends that you schedule a follow-up appointment in: 6 months   Any Other Special Instructions Will Be Listed Below (If Applicable).  If you need a refill on your cardiac medications before your next appointment, please call your pharmacy.   Signed, Sharlene Dory, NP

## 2023-05-05 NOTE — Patient Instructions (Addendum)
Medication Instructions:  Your physician recommends that you continue on your current medications as directed. Please refer to the Current Medication list given to you today.  Labwork: Tomorrow   Testing/Procedures: None   Follow-Up: Your physician recommends that you schedule a follow-up appointment in: 6 months   Any Other Special Instructions Will Be Listed Below (If Applicable).  If you need a refill on your cardiac medications before your next appointment, please call your pharmacy.

## 2023-05-07 LAB — HEPATIC FUNCTION PANEL
ALT: 22 [IU]/L (ref 0–32)
AST: 23 [IU]/L (ref 0–40)
Albumin: 4.4 g/dL (ref 3.7–4.7)
Alkaline Phosphatase: 109 [IU]/L (ref 44–121)
Bilirubin Total: 0.4 mg/dL (ref 0.0–1.2)
Bilirubin, Direct: 0.14 mg/dL (ref 0.00–0.40)
Total Protein: 6.6 g/dL (ref 6.0–8.5)

## 2023-05-07 LAB — LIPID PANEL
Chol/HDL Ratio: 3.7 {ratio} (ref 0.0–4.4)
Cholesterol, Total: 230 mg/dL — ABNORMAL HIGH (ref 100–199)
HDL: 62 mg/dL (ref >39)
LDL Chol Calc (NIH): 130 mg/dL — ABNORMAL HIGH (ref 0–99)
Triglycerides: 218 mg/dL — ABNORMAL HIGH (ref 0–149)
VLDL Cholesterol Cal: 38 mg/dL (ref 5–40)

## 2023-06-16 ENCOUNTER — Other Ambulatory Visit: Payer: Medicare Other

## 2023-06-19 ENCOUNTER — Ambulatory Visit: Payer: Medicare Other | Admitting: Nurse Practitioner

## 2023-07-10 ENCOUNTER — Telehealth: Payer: Self-pay | Admitting: Cardiology

## 2023-07-10 NOTE — Telephone Encounter (Signed)
 Office calling to request that pt's last Echo results as well as last office visit notes be faxed to their office. Please advise

## 2023-07-15 NOTE — Telephone Encounter (Signed)
 Request faxed to [facility] by Zachary George.

## 2023-08-12 ENCOUNTER — Other Ambulatory Visit: Payer: Self-pay | Admitting: Cardiology

## 2023-11-04 ENCOUNTER — Ambulatory Visit: Payer: Medicare Other | Attending: Nurse Practitioner | Admitting: Nurse Practitioner

## 2023-11-04 ENCOUNTER — Encounter: Payer: Self-pay | Admitting: Nurse Practitioner

## 2023-11-04 VITALS — BP 115/78 | HR 55 | Ht 63.0 in | Wt 171.4 lb

## 2023-11-04 DIAGNOSIS — R001 Bradycardia, unspecified: Secondary | ICD-10-CM

## 2023-11-04 DIAGNOSIS — I25119 Atherosclerotic heart disease of native coronary artery with unspecified angina pectoris: Secondary | ICD-10-CM

## 2023-11-04 DIAGNOSIS — J449 Chronic obstructive pulmonary disease, unspecified: Secondary | ICD-10-CM | POA: Insufficient documentation

## 2023-11-04 DIAGNOSIS — E785 Hyperlipidemia, unspecified: Secondary | ICD-10-CM | POA: Diagnosis present

## 2023-11-04 DIAGNOSIS — R600 Localized edema: Secondary | ICD-10-CM | POA: Insufficient documentation

## 2023-11-04 DIAGNOSIS — I2510 Atherosclerotic heart disease of native coronary artery without angina pectoris: Secondary | ICD-10-CM

## 2023-11-04 MED ORDER — EZETIMIBE 10 MG PO TABS: 10.0000 mg | ORAL_TABLET | Freq: Every day | ORAL | 1 refills | Status: DC

## 2023-11-04 MED ORDER — AMLODIPINE BESYLATE 5 MG PO TABS: 5.0000 mg | ORAL_TABLET | Freq: Every day | ORAL | 2 refills | Status: AC

## 2023-11-04 NOTE — Progress Notes (Unsigned)
 Cardiology Office Note:    Date:  11/04/2023 ID:  Rabon Bud, DOB 1940-07-28, MRN [id number] PCP:  Laurin Popp, MD [facility] Providers Cardiologist:  Teddie Favre, MD Electrophysiologist:  Efraim Grange, MD    Referring MD: Laurin Popp, MD  CC: Here for follow-up  History of Present Illness:    Amanda Jennings is a delightful 83 y.o. female with a PMH of mild, nonobstructive CAD, HLD, bradycardia, hypothyroidism, GERD, COPD, asthma, OSA, and orthostatic dizziness, who presents today for follow-up.   Coronary CTA with FFR 01/2021 revealed mild, nonobstructive CAD with coronary calcium  score 52.  EF normal by TTE.  I last saw her follow-up in December 2024. Was overall doing well at the time.   Today she presents for follow-up. Continues to do well. Denies any chest pain, shortness of breath, palpitations, syncope, presyncope, dizziness, orthopnea, PND, swelling or significant weight changes, acute bleeding, or claudication. Tells me she is now going to an endocrinologist for hx of thyroid disease, going to one in [facility].   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 Daughter is a Cabin crew.    ROS:   Please see the history of present illness.    All other systems reviewed and are negative.  EKGs/Labs/Other Studies Reviewed:    The following studies were reviewed today:  EKG:  EKG Interpretation Date/Time:  Tuesday November 04 2023 09:41:34 EDT Ventricular Rate:  56 PR Interval:  168 QRS Duration:  86 QT Interval:  408 QTC Calculation: 393 R Axis:   -38  Text Interpretation: Sinus bradycardia Left axis deviation Septal infarct , age undetermined No previous ECGs available Confirmed by Lasalle Pointer [phone]) on 11/04/2023 9:44:20 AM    Echo 03/2023: 1. Left ventricular ejection fraction, by estimation, is 60 to 65%. The  left ventricle has normal function. The left ventricle has no regional  wall motion  abnormalities. Left ventricular diastolic parameters are  consistent with Grade I diastolic  dysfunction (impaired relaxation).   2. Right ventricular systolic function is normal. The right ventricular  size is normal. There is normal pulmonary artery systolic pressure. The estimated right ventricular systolic pressure is 35.7 mmHg.   3. The mitral valve is grossly normal. Trivial mitral valve  regurgitation.   4. The aortic valve is tricuspid. Aortic valve regurgitation is trivial.  No aortic stenosis is present. Aortic valve mean gradient measures 4.0  mmHg.   5. Aortic dilatation noted. There is mild dilatation of the ascending  aorta, measuring 39 mm.   6. The inferior vena cava is normal in size with greater than 50%  respiratory variability, suggesting right atrial pressure of 3 mmHg.   Comparison(s): No significant change from prior study.   Cardiac monitor 05/2022: Predominant rhythm is sinus with heart rate ranging from 37 bpm up to 106 bpm and average heart rate 51 bpm. There were rare PACs including atrial couplets and triplets representing less than 1% total beats. There were rare PVCs including ventricular couplets representing less than 1% total beats.  Also limited episodes of ventricular bigeminy. Multiple episodes of brief PSVT were noted, the longest of which lasted only 12 beats. Limited possible ectopic atrial rhythm was also noted, not associated with RVR. There were no pauses.  Echocardiogram on 03/14/2022:   1. Left ventricular ejection fraction, by estimation, is 55 to 60%. The  left ventricle has normal function. The left ventricle has no regional  wall motion abnormalities. Left  ventricular diastolic parameters are  consistent with Grade I diastolic  dysfunction (impaired relaxation). The average left ventricular global  longitudinal strain is -19.8 %. The global longitudinal strain is normal.   2. Right ventricular systolic function is normal. The right  ventricular  size is normal. There is normal pulmonary artery systolic pressure. The  estimated right ventricular systolic pressure is 29.0 mmHg.   3. The mitral valve is grossly normal. Trivial mitral valve  regurgitation.   4. The aortic valve is tricuspid. Aortic valve regurgitation is trivial.   5. Aortic dilatation noted. There is mild dilatation of the ascending  aorta, measuring 38 mm.   6. The inferior vena cava is normal in size with greater than 50%  respiratory variability, suggesting right atrial pressure of 3 mmHg.   Comparison(s): No significant change from prior study. Prior images  reviewed side by side.  Carotid duplex (bilateral) on 04/26/2021:  Summary:  Right Carotid: There was no evidence of thrombus, dissection,  atherosclerotic                plaque or stenosis in the cervical carotid system.   Left Carotid: There was no evidence of thrombus, dissection,  atherosclerotic               plaque or stenosis in the cervical carotid system.   Vertebrals:  Bilateral vertebral arteries demonstrate antegrade flow.  Subclavians: Normal flow hemodynamics were seen in bilateral subclavian               arteries.   CCTA with FFR on 02/08/2021:  IMPRESSION: 1. Coronary calcium  score of 52 (LAD 50, LCX 2). This was 32 percentile for age and sex matched control.   2. Normal coronary origin with right dominance.   3. LAD is a large vessel that has proximal calcified plaque, 0-24% stenosis. There is mid stenosis 25-49%. FFR normal (0.93 to 0.87) .  4. CT FFR didn't show any significant stenosis.    CAD-RADS 2. Mild non-obstructive CAD (25-49%). Consider non-atherosclerotic causes of chest pain. Consider preventive therapy and risk factor modification.  IMPRESSION: 1.  Aortic Atherosclerosis (ICD10-I70.0). 2. 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 Recommend annual imaging followup by CTA or MRA. This recommendation follows 2010  ACCF/AHA/AATS/ACR/ASA/SCA/SCAI/SIR/STS/SVM Guidelines for the Diagnosis and Management of Patients with Thoracic Aortic Disease. Circulation. 2010; 121: Z610-R604. Aortic aneurysm NOS (ICD10-I71.9).  Lexiscan  on 01/02/2016:  There was no ST segment deviation noted during stress. The study is normal. There are no perfusion defects consistent with prior infarction or current ischemia. This is a low risk study. The left ventricular ejection fraction is normal (55-65%).  Physical Exam:    VS:  BP 115/78   Pulse (!) 55   Ht 5' 3 (1.6 m)   Wt 171 lb 6.4 oz (77.7 kg)   SpO2 99%   BMI 30.36 kg/m     Wt Readings from Last 3 Encounters:  11/04/23 171 lb 6.4 oz (77.7 kg)  05/05/23 172 lb (78 kg)  03/10/23 174 lb 6.4 oz (79.1 kg)     GEN: Well nourished, well developed in no acute distress HEENT: Normal NECK: No JVD; No carotid bruits CARDIAC: S1/S2, RRR, no murmurs, rubs, gallops; 2+ pulses RESPIRATORY:  Clear to auscultation without rales, wheezing or rhonchi  MUSCULOSKELETAL: No edema no deformity  SKIN: Warm and dry NEUROLOGIC:  Alert and oriented x 3 PSYCHIATRIC:  Normal affect   Assessment and Plan:    In order of  problems listed above:  Mild, nonobstructive CAD, HLD Stable with no anginal symptoms. No indication for ischemic evaluation.  CCTA in 2022 showed coronary calcium  score 52.  Proximal calcified plaque was noted along LAD around 0 to 24% stenosis.  There was mild stenosis of LAD at 25 to 49%, FFR normal.  Found to have mild, nonobstructive CAD. Stable with no anginal symptoms.  Continue rosuvastatin  and coenzyme Q 10. Cannot tolerate increased dose of statin. Will add Zetia 10 mg daily to medication regimen as most recent LDL 139. Will repeat FLP/LFT in 3 months. Heart healthy diet and regular cardiovascular exercise encouraged.   COPD Denies any recent symptoms. Echo unremarkable 03/2023.  Continue to follow-up with pulmonology as scheduled.  Leg edema Stable, none  noted on exam. Continue Lasix . Low-salt, heart healthy diet encouraged. Leg elevation as needed encouraged.   Disposition: Follow-up with me or APP in 6 months or sooner if anything changes.    Medication Adjustments/Labs and Tests Ordered: Current medicines are reviewed at length with the patient today.  Concerns regarding medicines are outlined above.  Orders Placed This Encounter  Procedures   Hepatic function panel   Lipid Profile   EKG 12-Lead   Meds ordered this encounter  Medications   amLODipine  (NORVASC ) 5 MG tablet    Sig: Take 1 tablet (5 mg total) by mouth daily.    Dispense:  90 tablet    Refill:  2   ezetimibe (ZETIA) 10 MG tablet    Sig: Take 1 tablet (10 mg total) by mouth daily.    Dispense:  90 tablet    Refill:  1    Patient Instructions  Medication Instructions:  Your physician has recommended you make the following change in your medication:  Please start Zetia 10 Mg daily   Labwork: In 3 months at [facility]   Testing/Procedures: None   Follow-Up: Your physician recommends that you schedule a follow-up appointment in: 6 Months   Any Other Special Instructions Will Be Listed Below (If Applicable).  If you need a refill on your cardiac medications before your next appointment, please call your pharmacy.   Signed, Lasalle Pointer, NP

## 2023-11-04 NOTE — Patient Instructions (Addendum)
 Medication Instructions:  Your physician has recommended you make the following change in your medication:  Please start Zetia 10 Mg daily   Labwork: In 3 months at [facility]   Testing/Procedures: None   Follow-Up: Your physician recommends that you schedule a follow-up appointment in: 6 Months   Any Other Special Instructions Will Be Listed Below (If Applicable).  If you need a refill on your cardiac medications before your next appointment, please call your pharmacy.

## 2023-11-30 ENCOUNTER — Other Ambulatory Visit: Payer: Self-pay | Admitting: Nurse Practitioner

## 2024-01-21 ENCOUNTER — Encounter: Payer: Self-pay | Admitting: Cardiology

## 2024-01-22 ENCOUNTER — Telehealth: Payer: Self-pay | Admitting: Cardiology

## 2024-01-22 NOTE — Progress Notes (Signed)
  Ms. Coil perioperative risk of a major cardiac event is 0.4% according to the Revised Cardiac Risk Index (RCRI).  Therefore, she is at low risk for perioperative complications.   Her functional capacity is fair at > 4 METs according to the Duke Activity Status Index (DASI). Recommendations: According to ACC/AHA guidelines, no further cardiovascular testing needed.  The patient may proceed to surgery at acceptable risk.   Antiplatelet and/or Anticoagulation Recommendations: She is not on any Aspirin or Antiplatelet/Anticoagulation medication that needs to be held prior to procedure.   Almarie Crate, NP

## 2024-01-22 NOTE — Telephone Encounter (Signed)
 Amanda Jennings,   You saw this patient on 11/04/2023. Per office protocol, will you please comment on medical clearance for right total knee replacement? She cannot have a telephone visit, as she lives in [place].   Please route your response to P CV DIV Preop. I will communicate with requesting office once you have given recommendations.   Thank you!  Barnie Hila, NP

## 2024-01-22 NOTE — Telephone Encounter (Signed)
 Ms. Wesenberg perioperative risk of a major cardiac event is 0.4% according to the Revised Cardiac Risk Index (RCRI).  Therefore, she is at low risk for perioperative complications.   Her functional capacity is fair at > 4 METs according to the Duke Activity Status Index (DASI). Recommendations: According to ACC/AHA guidelines, no further cardiovascular testing needed.  The patient may proceed to surgery at acceptable risk.   Antiplatelet and/or Anticoagulation Recommendations: She is not on any Aspirin or Antiplatelet/Anticoagulation medication that needs to be held prior to procedure.    Almarie Crate, NP

## 2024-01-22 NOTE — Telephone Encounter (Signed)
   Name: Neela Zecca  DOB: Oct 08, 1940  MRN: [id number]   Primary Cardiologist: Jayson Sierras, MD  Chart reviewed as part of pre-operative protocol coverage. Gracie Oravec was last seen on 11/04/2023 by Almarie Crate, NP.  Per Almarie Ms. Ogborn's perioperative risk of a major cardiac event is 0.4% according to the Revised Cardiac Risk Index (RCRI).  Therefore, she is at low risk for perioperative complications.   Her functional capacity is fair at > 4 METs according to the Duke Activity Status Index (DASI). Recommendations: According to ACC/AHA guidelines, no further cardiovascular testing needed.  The patient may proceed to surgery at acceptable risk.   Antiplatelet and/or Anticoagulation Recommendations: She is not on any Aspirin or Antiplatelet/Anticoagulation medication that needs to be held prior to procedure.   I will route this recommendation to the requesting party via Epic fax function and remove from pre-op pool. Please call with questions.  Barnie Hila, NP 01/22/2024, 4:16 PM

## 2024-01-22 NOTE — Telephone Encounter (Signed)
   Pre-operative Risk Assessment    Patient Name: Amanda Jennings  DOB: 1941/05/16 MRN: [id number]      Request for Surgical Clearance    Procedure:  RT Total Knee replacement   Date of Surgery:  Clearance TBD                                 Surgeon:  Lamar Slade Surgeon's Group or Practice Name:  [facility] Phone number:  [phone] Fax number:  [phone]    Type of Clearance Requested:   - Medical    Type of Anesthesia:  Not Indicated   Additional requests/questions:  Please fax a copy of Clearance to the surgeon's office.  Signed, Darryle GORMAN Glance   01/22/2024, 9:19 AM

## 2024-04-06 ENCOUNTER — Telehealth: Payer: Self-pay | Admitting: Nurse Practitioner

## 2024-04-06 ENCOUNTER — Other Ambulatory Visit: Payer: Self-pay | Admitting: *Deleted

## 2024-04-06 DIAGNOSIS — E785 Hyperlipidemia, unspecified: Secondary | ICD-10-CM

## 2024-04-06 DIAGNOSIS — I2510 Atherosclerotic heart disease of native coronary artery without angina pectoris: Secondary | ICD-10-CM

## 2024-04-06 DIAGNOSIS — Z79899 Other long term (current) drug therapy: Secondary | ICD-10-CM

## 2024-04-06 LAB — HEPATIC FUNCTION PANEL
ALT: 21 IU/L (ref 0–32)
AST: 26 IU/L (ref 0–40)
Albumin: 4.5 g/dL (ref 3.7–4.7)
Alkaline Phosphatase: 104 IU/L (ref 48–129)
Bilirubin Total: 0.5 mg/dL (ref 0.0–1.2)
Bilirubin, Direct: 0.15 mg/dL (ref 0.00–0.40)
Total Protein: 6.5 g/dL (ref 6.0–8.5)

## 2024-04-06 LAB — LIPID PANEL
Chol/HDL Ratio: 3 ratio (ref 0.0–4.4)
Cholesterol, Total: 203 mg/dL — ABNORMAL HIGH (ref 100–199)
HDL: 68 mg/dL (ref >39)
LDL Chol Calc (NIH): 111 mg/dL — ABNORMAL HIGH (ref 0–99)
Triglycerides: 141 mg/dL (ref 0–149)
VLDL Cholesterol Cal: 24 mg/dL (ref 5–40)

## 2024-04-06 MED ORDER — ROSUVASTATIN CALCIUM 10 MG PO TABS: 10.0000 mg | ORAL_TABLET | Freq: Every day | ORAL | 1 refills | Status: AC

## 2024-04-06 NOTE — Telephone Encounter (Signed)
  Patient Consent for Virtual Visit        Amanda Jennings has provided verbal consent on 04/06/2024 for a virtual visit (video or telephone).   CONSENT FOR VIRTUAL VISIT FOR:  Amanda Jennings  By participating in this virtual visit I agree to the following:  I hereby voluntarily request, consent and authorize [facility] and its employed or contracted physicians, physician assistants, nurse practitioners or other licensed health care professionals (the Practitioner), to provide me with telemedicine health care services (the "Services) as deemed necessary by the treating Practitioner. I acknowledge and consent to receive the Services by the Practitioner via telemedicine. I understand that the telemedicine visit will involve communicating with the Practitioner through live audiovisual communication technology and the disclosure of certain medical information by electronic transmission. I acknowledge that I have been given the opportunity to request an in-person assessment or other available alternative prior to the telemedicine visit and am voluntarily participating in the telemedicine visit.  I understand that I have the right to withhold or withdraw my consent to the use of telemedicine in the course of my care at any time, without affecting my right to future care or treatment, and that the Practitioner or I may terminate the telemedicine visit at any time. I understand that I have the right to inspect all information obtained and/or recorded in the course of the telemedicine visit and may receive copies of available information for a reasonable fee.  I understand that some of the potential risks of receiving the Services via telemedicine include:  Delay or interruption in medical evaluation due to technological equipment failure or disruption; Information transmitted may not be sufficient (e.g. poor resolution of images) to allow for appropriate medical decision making by the  Practitioner; and/or  In rare instances, security protocols could fail, causing a breach of personal health information.  Furthermore, I acknowledge that it is my responsibility to provide information about my medical history, conditions and care that is complete and accurate to the best of my ability. I acknowledge that Practitioner's advice, recommendations, and/or decision may be based on factors not within their control, such as incomplete or inaccurate data provided by me or distortions of diagnostic images or specimens that may result from electronic transmissions. I understand that the practice of medicine is not an exact science and that Practitioner makes no warranties or guarantees regarding treatment outcomes. I acknowledge that a copy of this consent can be made available to me via my patient portal [facility] MyChart), or I can request a printed copy by calling the office of [facility].    I understand that my insurance will be billed for this visit.   I have read or had this consent read to me. I understand the contents of this consent, which adequately explains the benefits and risks of the Services being provided via telemedicine.  I have been provided ample opportunity to ask questions regarding this consent and the Services and have had my questions answered to my satisfaction. I give my informed consent for the services to be provided through the use of telemedicine in my medical care

## 2024-04-08 ENCOUNTER — Ambulatory Visit: Payer: Self-pay | Admitting: Nurse Practitioner

## 2024-05-04 ENCOUNTER — Telehealth: Admitting: Nurse Practitioner

## 2024-06-28 ENCOUNTER — Ambulatory Visit: Admitting: Nurse Practitioner

## 2024-09-13 ENCOUNTER — Ambulatory Visit: Admitting: Nurse Practitioner
